# Patient Record
Sex: Male | Born: 1940
Health system: Southern US, Community
[De-identification: ages and names within clinical notes are randomized; demographics above are authoritative.]

## PROBLEM LIST (undated history)

## (undated) DIAGNOSIS — I25119 Atherosclerotic heart disease of native coronary artery with unspecified angina pectoris: Secondary | ICD-10-CM

## (undated) DIAGNOSIS — G4700 Insomnia, unspecified: Secondary | ICD-10-CM

## (undated) DIAGNOSIS — I10 Essential (primary) hypertension: Secondary | ICD-10-CM

## (undated) DIAGNOSIS — Z86010 Personal history of colonic polyps: Secondary | ICD-10-CM

## (undated) DIAGNOSIS — N529 Male erectile dysfunction, unspecified: Secondary | ICD-10-CM

## (undated) DIAGNOSIS — I2510 Atherosclerotic heart disease of native coronary artery without angina pectoris: Secondary | ICD-10-CM

## (undated) DIAGNOSIS — M1610 Unilateral primary osteoarthritis, unspecified hip: Secondary | ICD-10-CM

## (undated) DIAGNOSIS — R911 Solitary pulmonary nodule: Secondary | ICD-10-CM

## (undated) DIAGNOSIS — I340 Nonrheumatic mitral (valve) insufficiency: Secondary | ICD-10-CM

## (undated) DIAGNOSIS — E7800 Pure hypercholesterolemia, unspecified: Secondary | ICD-10-CM

## (undated) DIAGNOSIS — R55 Syncope and collapse: Secondary | ICD-10-CM

## (undated) DIAGNOSIS — D126 Benign neoplasm of colon, unspecified: Secondary | ICD-10-CM

## (undated) DIAGNOSIS — R7301 Impaired fasting glucose: Secondary | ICD-10-CM

## (undated) DIAGNOSIS — C44310 Basal cell carcinoma of skin of unspecified parts of face: Secondary | ICD-10-CM

## (undated) DIAGNOSIS — I700 Atherosclerosis of aorta: Secondary | ICD-10-CM

## (undated) DIAGNOSIS — M11269 Other chondrocalcinosis, unspecified knee: Secondary | ICD-10-CM

## (undated) HISTORY — DX: Syncope and collapse: R55

## (undated) HISTORY — PX: POLYPECTOMY: SHX149

## (undated) HISTORY — DX: Essential (primary) hypertension: I10

## (undated) HISTORY — DX: Pure hypercholesterolemia, unspecified: E78.00

## (undated) HISTORY — DX: Nonrheumatic mitral (valve) insufficiency: I34.0

## (undated) HISTORY — DX: Male erectile dysfunction, unspecified: N52.9

## (undated) HISTORY — DX: Benign neoplasm of colon, unspecified: D12.6

## (undated) HISTORY — PX: TONSILLECTOMY: SHX5217

## (undated) HISTORY — PX: COLONOSCOPY: SHX174

## (undated) HISTORY — DX: Personal history of colonic polyps: Z86.010

## (undated) HISTORY — DX: Insomnia, unspecified: G47.00

## (undated) HISTORY — DX: Atherosclerotic heart disease of native coronary artery with unspecified angina pectoris: I25.119

## (undated) HISTORY — DX: Basal cell carcinoma of skin of unspecified parts of face: C44.310

## (undated) HISTORY — DX: Atherosclerosis of aorta: I70.0

## (undated) HISTORY — DX: Atherosclerotic heart disease of native coronary artery without angina pectoris: I25.10

## (undated) HISTORY — DX: Impaired fasting glucose: R73.01

## (undated) HISTORY — DX: Other chondrocalcinosis, unspecified knee: M11.269

## (undated) HISTORY — DX: Solitary pulmonary nodule: R91.1

## (undated) HISTORY — DX: Unilateral primary osteoarthritis, unspecified hip: M16.10

## (undated) HISTORY — PX: VASECTOMY: SHX75

---

## 2003-07-30 DIAGNOSIS — M11269 Other chondrocalcinosis, unspecified knee: Secondary | ICD-10-CM

## 2003-07-30 HISTORY — DX: Other chondrocalcinosis, unspecified knee: M11.269

## 2003-08-09 ENCOUNTER — Encounter: Admission: RE | Admit: 2003-08-09 | Discharge: 2003-08-09 | Payer: Self-pay | Admitting: Internal Medicine

## 2003-08-09 ENCOUNTER — Encounter: Payer: Self-pay | Admitting: Internal Medicine

## 2005-03-16 ENCOUNTER — Ambulatory Visit: Payer: Self-pay | Admitting: Internal Medicine

## 2005-03-29 DIAGNOSIS — D126 Benign neoplasm of colon, unspecified: Secondary | ICD-10-CM

## 2005-03-29 HISTORY — DX: Benign neoplasm of colon, unspecified: D12.6

## 2005-03-30 ENCOUNTER — Ambulatory Visit: Payer: Self-pay | Admitting: Internal Medicine

## 2005-03-30 ENCOUNTER — Encounter (INDEPENDENT_AMBULATORY_CARE_PROVIDER_SITE_OTHER): Payer: Self-pay | Admitting: *Deleted

## 2006-03-29 DIAGNOSIS — C44310 Basal cell carcinoma of skin of unspecified parts of face: Secondary | ICD-10-CM

## 2006-03-29 HISTORY — DX: Basal cell carcinoma of skin of unspecified parts of face: C44.310

## 2006-03-29 HISTORY — PX: MOHS SURGERY: SHX181

## 2010-04-06 ENCOUNTER — Encounter (INDEPENDENT_AMBULATORY_CARE_PROVIDER_SITE_OTHER): Payer: Self-pay | Admitting: *Deleted

## 2010-05-23 ENCOUNTER — Encounter (INDEPENDENT_AMBULATORY_CARE_PROVIDER_SITE_OTHER): Payer: Self-pay | Admitting: *Deleted

## 2010-05-25 ENCOUNTER — Ambulatory Visit: Payer: Self-pay | Admitting: Internal Medicine

## 2010-06-05 ENCOUNTER — Telehealth: Payer: Self-pay | Admitting: Internal Medicine

## 2010-06-08 ENCOUNTER — Ambulatory Visit: Payer: Self-pay | Admitting: Internal Medicine

## 2010-06-12 ENCOUNTER — Encounter: Payer: Self-pay | Admitting: Internal Medicine

## 2010-11-28 NOTE — Progress Notes (Signed)
  Phone Note Call from Patient Call back at Home Phone [phone]   Reason for Call: Talk to Nurse Summary of Call: Patient called straight into the previsit office with a question.  I was busy rescheduling a patient, and told him that I would take his name and number and call him back.  I called him back at approximately 9:20am and the phone was busy x 2. Will attempt to contact later.  Spoke with patient on phone, and he asked if he could eat some fruit 5 days before the procedure.  The list does only specify raw veggies and salads.  I told the patient that he could eat fruit at that time. Initial call taken by: Clide Cliff RN,  June 05, 2010 9:25 AM

## 2010-11-28 NOTE — Letter (Signed)
Summary: [facility] Instructions  [facility]  [address] [place], [place] [address]   Phone: [phone]  Fax: [phone]       Eric Simmons    [birth year]    MRN: [id number]        Procedure Day Dorna Bloom:  Lenor Coffin  06/08/10     Arrival Time:  7:30am     Procedure Time:  8:30am     Location of Procedure:                    _ X_  [facility] (4th Floor)                        PREPARATION FOR COLONOSCOPY WITH MOVIPREP   Starting 5 days prior to your procedure  SATURDAY 06/03/10  do not eat nuts, seeds, popcorn, corn, beans, peas,  salads, or any raw vegetables.  Do not take any fiber supplements (e.g. Metamucil, Citrucel, and Benefiber).  THE DAY BEFORE YOUR PROCEDURE         DATE: [facility]  06/07/10  1.  Drink clear liquids the entire day-NO SOLID FOOD  2.  Do not drink anything colored red or purple.  Avoid juices with pulp.  No orange juice.  3.  Drink at least 64 oz. (8 glasses) of fluid/clear liquids during the day to prevent dehydration and help the prep work efficiently.  CLEAR LIQUIDS INCLUDE: Water Jello Ice Popsicles Tea (sugar ok, no milk/cream) Powdered fruit flavored drinks Coffee (sugar ok, no milk/cream) Gatorade Juice: apple, Mincey grape, Spivak cranberry  Lemonade Clear bullion, consomm, broth Carbonated beverages (any kind) Strained chicken noodle soup Hard Candy                             4.  In the morning, mix first dose of MoviPrep solution:    Empty 1 Pouch A and 1 Pouch B into the disposable container    Add lukewarm drinking water to the top line of the container. Mix to dissolve    Refrigerate (mixed solution should be used within 24 hrs)  5.  Begin drinking the prep at 5:00 p.m. The MoviPrep container is divided by 4 marks.   Every 15 minutes drink the solution down to the next mark (approximately 8 oz) until the full liter is complete.   6.  Follow completed prep with 16 oz of clear liquid of your  choice (Nothing red or purple).  Continue to drink clear liquids until bedtime.  7.  Before going to bed, mix second dose of MoviPrep solution:    Empty 1 Pouch A and 1 Pouch B into the disposable container    Add lukewarm drinking water to the top line of the container. Mix to dissolve    Refrigerate  THE DAY OF YOUR PROCEDURE      DATE: THURSDAY  06/08/10  Beginning at  3:30 a.m. (5 hours before procedure):         1. Every 15 minutes, drink the solution down to the next mark (approx 8 oz) until the full liter is complete.  2. Follow completed prep with 16 oz. of clear liquid of your choice.    3. You may drink clear liquids until 6:30am (2 HOURS BEFORE PROCEDURE).   MEDICATION INSTRUCTIONS  Unless otherwise instructed, you should take regular prescription medications with a small sip of water   as early as  possible the morning of your procedure.        OTHER INSTRUCTIONS  You will need a responsible adult at least 71 years of age to accompany you and drive you home.   This person must remain in the waiting room during your procedure.  Wear loose fitting clothing that is easily removed.  Leave jewelry and other valuables at home.  However, you may wish to bring a book to read or  an iPod/MP3 player to listen to music as you wait for your procedure to start.  Remove all body piercing jewelry and leave at home.  Total time from sign-in until discharge is approximately 2-3 hours.  You should go home directly after your procedure and rest.  You can resume normal activities the  day after your procedure.  The day of your procedure you should not:   Drive   Make legal decisions   Operate machinery   Drink alcohol   Return to work  You will receive specific instructions about eating, activities and medications before you leave.    The above instructions have been reviewed and explained to me by  Wyona Almas RN  May 25, 2010 8:17 AM     I fully  understand and can verbalize these instructions _____________________________ Date _________

## 2010-11-28 NOTE — Procedures (Signed)
Summary: Colonoscopy  Patient: Eric Simmons Note: All result statuses are Final unless otherwise noted.  Tests: (1) Colonoscopy (COL)   COL Colonoscopy           DONE     [facility]     [address]     [place], [place]  [address]           COLONOSCOPY PROCEDURE REPORT           PATIENT:  Eric Simmons  MR#:  [id number]     BIRTHDATE:  10-10-1941, 69 yrs. old  GENDER:  male     ENDOSCOPIST:  Iva Boop, MD, [facility]           PROCEDURE DATE:  06/08/2010     PROCEDURE:  Colonoscopy with biopsy     ASA CLASS:  Class II     INDICATIONS:  surveillance and high-risk screening, history of     pre-cancerous (adenomatous) colon polyps, family history of colon     cancer Two adenomas, max 7mm 2006     Parent had colon cancer in 70's     MEDICATIONS:   Fentanyl 50 mcg IV, Versed 5 mg IV           DESCRIPTION OF PROCEDURE:   After the risks benefits and     alternatives of the procedure were thoroughly explained, informed     consent was obtained.  Digital rectal exam was performed and     revealed no abnormalities and normal prostate.   The LB CF-H180AL     [id number] endoscope was introduced through the anus and advanced to     the cecum, which was identified by both the appendix and ileocecal     valve, without limitations.  The quality of the prep was     excellent, using MoviPrep.  The instrument was then slowly     withdrawn as the colon was fully examined.     Insertion: 3:42 minutes Withdrawal: 9:12 minutes     <<PROCEDUREIMAGES>>           FINDINGS:  A diminutive polyp was found at the splenic flexure.     The polyp was removed using cold biopsy forceps.  This was     otherwise a normal examination of the colon.   Retroflexed views     in the rectum revealed no abnormalities.    The scope was then     withdrawn from the patient and the procedure completed.           COMPLICATIONS:  None     ENDOSCOPIC IMPRESSION:     1) Diminutive polyp at the splenic flexure -  removed     2) Otherwise normal examination, excellent prep     3) Personal history of adnomas 2006     4) Elderly parent had colon cancer           REPEAT EXAM:  In for Colonoscopy, pending biopsy results.           Iva Boop, MD, [facility]           CC:  Lajean Saver, MD     The Patient           n.     eSIGNED:   Iva Boop at 06/08/2010 10:08 AM           Casandra Doffing, [id number]  Note: An exclamation mark (!) indicates a result that was not dispersed into the flowsheet.  Document Creation Date: 06/08/2010 10:09 AM _______________________________________________________________________  (1) Order result status: Final Collection or observation date-time: 06/08/2010 09:51 Requested date-time:  Receipt date-time:  Reported date-time:  Referring Physician:   Ordering Physician: Stan Head [phone]) Specimen Source:  Source: Launa Grill Order Number: [id number] Lab site:   Appended Document: Colonoscopy     Procedures Next Due Date:    Colonoscopy: 05/2015

## 2010-11-28 NOTE — Letter (Signed)
Summary: Patient Notice-colonic leiomyoma  [facility]  [address] [place], [place] [address]   Phone: [phone]  Fax: [phone]        June 12, 2010 MRN: [id number]    [facility] [address] [place], [place]  [address]    Dear Mr. MABILE,  I am pleased to inform you that the colon polyp removed during your recent colonoscopy was found to be a leiomyoma, or benign muscle tumor. These types of polyps are NOT pre-cancerous.  Since you have had pre-cancerous polyps before and you have a family history of colon cancer, you should consider having a repeat colonoscopy in about 5 years if it makes sense in the context of your health status at that time.  Should you develop new or worsening symptoms of abdominal pain, bowel habit changes or bleeding from the rectum or bowels, please schedule an evaluation with either your primary care physician or with me.  Please call us if you are having persistent problems or have questions about your condition that have not been fully answered at this time.  As always, it was a pleasure to see you and I enjoyed meeting your brother and discussing the [place] War history. Please say hi to your wife, too.  Sincerely,  Ashley Murrain MD, [facility] This letter has been electronically signed by your physician.  Appended Document: Patient Notice-colonic leiomyoma letter mailed

## 2010-11-28 NOTE — Miscellaneous (Signed)
Summary: [facility] Previsit/prep  Clinical Lists Changes  Medications: Added new medication of MOVIPREP 100 GM  SOLR (PEG-KCL-NACL-NASULF-NA ASC-C) As per prep instructions. - Signed Rx of MOVIPREP 100 GM  SOLR (PEG-KCL-NACL-NASULF-NA ASC-C) As per prep instructions.;  #1 x 0;  Signed;  Entered by: Wyona Almas RN;  Authorized by: Iva Boop MD, [facility];  Method used: Electronically to [facility] [address] [place], [place], [place]  [address], Ph: [phone], Fax: [phone] Allergies: Added new allergy or adverse reaction of CODEINE Observations: Added new observation of NKA: F (05/25/2010 7:52)    Prescriptions: MOVIPREP 100 GM  SOLR (PEG-KCL-NACL-NASULF-NA ASC-C) As per prep instructions.  #1 x 0   Entered by:   Wyona Almas RN   Authorized by:   Iva Boop MD, [facility]   Signed by:   Wyona Almas RN on 05/25/2010   Method used:   Electronically to        [facility]retail[facility]       [address]       [place], [place]  [address]       Ph: [phone]       Fax: [phone]   RxID:   [id number]

## 2010-11-28 NOTE — Letter (Signed)
Summary: Colonoscopy Letter  [facility]  [address] [place], [place] [address]   Phone: [phone]  Fax: [phone]      April 06, 2010 MRN: [id number]   [facility] [address] [place], [place]  [address]   Dear Eric Simmons,   According to your medical record, it is time for you to schedule a Colonoscopy. The American Cancer Society recommends this procedure as a method to detect early colon cancer. Patients with a family history of colon cancer, or a personal history of colon polyps or inflammatory bowel disease are at increased risk.  This letter has beeen generated based on the recommendations made at the time of your procedure. If you feel that in your particular situation this may no longer apply, please contact our office.  Please call our office at [phone] to schedule this appointment or to update your records at your earliest convenience.  Thank you for cooperating with [place] to provide you with the very best care possible.   Sincerely,   Iva Boop, M.D.  [facility] [phone]

## 2010-12-21 ENCOUNTER — Other Ambulatory Visit: Payer: Self-pay | Admitting: Dermatology

## 2011-03-02 ENCOUNTER — Ambulatory Visit (INDEPENDENT_AMBULATORY_CARE_PROVIDER_SITE_OTHER): Payer: Medicare Other | Admitting: Family Medicine

## 2011-03-02 DIAGNOSIS — S30860A Insect bite (nonvenomous) of lower back and pelvis, initial encounter: Secondary | ICD-10-CM

## 2011-03-02 DIAGNOSIS — L539 Erythematous condition, unspecified: Secondary | ICD-10-CM

## 2011-05-03 ENCOUNTER — Encounter: Payer: Self-pay | Admitting: Family Medicine

## 2011-05-03 ENCOUNTER — Ambulatory Visit (INDEPENDENT_AMBULATORY_CARE_PROVIDER_SITE_OTHER): Payer: Medicare Other | Admitting: Family Medicine

## 2011-05-03 VITALS — BP 140/88 | HR 60 | Ht 73.0 in | Wt 170.0 lb

## 2011-05-03 DIAGNOSIS — R202 Paresthesia of skin: Secondary | ICD-10-CM

## 2011-05-03 DIAGNOSIS — M79601 Pain in right arm: Secondary | ICD-10-CM

## 2011-05-03 DIAGNOSIS — R209 Unspecified disturbances of skin sensation: Secondary | ICD-10-CM

## 2011-05-03 DIAGNOSIS — M79609 Pain in unspecified limb: Secondary | ICD-10-CM

## 2011-05-03 MED ORDER — NAPROXEN 500 MG PO TABS: 500.0000 mg | ORAL_TABLET | Freq: Two times a day (BID) | ORAL | Status: DC

## 2011-05-03 NOTE — Progress Notes (Signed)
Subjective:    Patient ID: Eric Simmons, male    DOB: 01-30-1941, 70 y.o.   MRN: [id number]  HPI Patient presents for evaluation of R arm pain and tingling.  Pain starts at the shoulder, and radiates down to the elbow.  Tingling goes all the way down to the hand/wrist (not into the fingers).  During exam was able to note some tingling in the R thumb (which didn't change with Phalen).  Pain is bothersome in the evenings, hurts to sleep on his right side, feels better when he changes to his left side.  Pain/tingling has been going on for about a week.  Tingling seems improved if he uses his arm more while on his exercise bike (increased from 10 to 15 minutes).  Denies other changes in activity.  Denies neck pain, upper back pain.  Denies pain with movements or any particular activity.  Started taking Naprosyn sporadically for severe pain, and this seems to help some.  Just taking 1/day, if any, of the naprosyn (has taken 4-5 in the last 7-10 days).  Denies weakness  Past Medical History  Diagnosis Date  . Hypertension   . Pure hypercholesterolemia   . Impaired fasting glucose   . Pseudogout of knee 10/04    left  . Adenomatous colon polyp 6/06    tubular adenoma, Dr. Leone Payor  . Hemorrhoids     internal and external  . BCC (basal cell carcinoma), face 6/07    left face  . Insomnia     Past Surgical History  Procedure Date  . Colonoscopy 6/06, 05/2010    Dr. Leone Payor  . Tonsillectomy   . Mohs surgery 6/07    L face, Dr. Park Liter    History   Social History  . Marital Status: Married    Spouse Name: N/A    Number of Children: N/A  . Years of Education: N/A   Occupational History  . Not on file.   Social History Main Topics  . Smoking status: Never Smoker   . Smokeless tobacco: Not on file  . Alcohol Use: Yes     1 light beer every afternoon  . Drug Use: No  . Sexually Active: Not on file   Other Topics Concern  . Not on file   Social History Narrative  . No  narrative on file    Family History  Problem Relation Age of Onset  . Cancer Mother 71    colon cancer  . Diabetes Mother   . Heart disease Father     MI  . Hypertension Father   . Hyperlipidemia Brother   . Heart disease Brother     septal defect  . Tremor Brother   . Hyperlipidemia Brother   . Tremor Brother   . Hyperlipidemia Brother     Current outpatient prescriptions:aspirin 81 MG tablet, Take 81 mg by mouth daily.  , Disp: , Rfl: ;  magnesium gluconate (MAGONATE) 500 MG tablet, Take 500 mg by mouth daily.  , Disp: , Rfl: ;  Multiple Vitamin (ANTIOXIDANT FORMULA PO), Take 1 tablet by mouth daily.  , Disp: , Rfl: ;  simvastatin (ZOCOR) 40 MG tablet, Take 40 mg by mouth at bedtime.  , Disp: , Rfl:  zolpidem (AMBIEN) 5 MG tablet, Take 2.5 mg by mouth at bedtime as needed.  , Disp: , Rfl: ;  DISCONTD: naproxen (NAPROSYN) 500 MG tablet, Take 500 mg by mouth daily. , Disp: , Rfl: ;  DISCONTD: naproxen sodium (ANAPROX)  220 MG tablet, Take 220 mg by mouth 2 (two) times daily with a meal.  , Disp: , Rfl: ;  naproxen (NAPROSYN) 500 MG tablet, Take 1 tablet (500 mg total) by mouth 2 (two) times daily with a meal., Disp: 30 tablet, Rfl: 0  Allergies  Allergen Reactions  . Codeine     REACTION: violent vomiting    Review of Systems Denies fever, URI symptoms, cough, SOB, skin rash, other joint complaints, GI complaints, chest pain, headaches, dizziness or other concerns    Objective:   Physical Exam BP 140/88  Pulse 60  Ht 6\' 1"  (1.854 m)  Wt 170 lb (77.111 kg)  BMI 22.43 kg/m2 Well developed, pleasant male in no distress Neck--no c-spine tenderness, lymphadenopathy or masses.  No muscle spasm Slight decreased ROM R shoulder--with reaching R hand over L shoulder, and decreased internal rotation (R thumb about 2 inches lower than L, behind back).  Strength 5/5.  Negative Tinel and Phalen's testing.  Normal sensation 2+ radial pulses and brisk capillary refill bilaterally         Assessment & Plan:   1. Paresthesias in right hand  naproxen (NAPROSYN) 500 MG tablet  2. Arm pain, right     Differential diagnoses reviewed (including cervical radiculopathy, peripheral nerve impingement/neuropathy, carpal tunnel).  Trial of NSAIDs (risks/precautions reviewed with pt).  May need further workup if pain and paresthesias persist (may need x-ray vs MRI vs EMG/NCV).  Follow up in 2 weeks if not resolved.  BP elevated today.  Patient has BP monitor at home--instructed pt to periodically check BP's at home.  BP higher today than at previous visits

## 2011-05-03 NOTE — Patient Instructions (Signed)
Periodically check your blood pressure at home (it was a little high today at 140/88). Take the medication twice daily with food--take regularly until symptoms completely resolve, or follow-up in 2 weeks if ongoing symptoms to discuss the next step

## 2011-05-15 ENCOUNTER — Encounter: Payer: Self-pay | Admitting: Family Medicine

## 2011-05-17 ENCOUNTER — Ambulatory Visit (INDEPENDENT_AMBULATORY_CARE_PROVIDER_SITE_OTHER): Payer: Medicare Other | Admitting: Family Medicine

## 2011-05-17 ENCOUNTER — Encounter: Payer: Self-pay | Admitting: Family Medicine

## 2011-05-17 VITALS — BP 122/72 | HR 68 | Ht 73.0 in | Wt 170.0 lb

## 2011-05-17 DIAGNOSIS — M79601 Pain in right arm: Secondary | ICD-10-CM

## 2011-05-17 DIAGNOSIS — M79609 Pain in unspecified limb: Secondary | ICD-10-CM

## 2011-05-17 MED ORDER — METHYLPREDNISOLONE (PAK) 4 MG PO TABS: ORAL_TABLET | ORAL | Status: AC

## 2011-05-17 MED ORDER — HYDROCODONE-ACETAMINOPHEN 7.5-500 MG PO TABS: 1.0000 | ORAL_TABLET | Freq: Four times a day (QID) | ORAL | Status: DC | PRN

## 2011-05-17 NOTE — Progress Notes (Signed)
Patient presents for evaluation of ongoing pain and tingling in his R shoulder, which radiates down the arm.  Having pain mainly at night, between 2-4am.  Gets some relief by changing position, to sleep on his left side.  He has taken the Naprosyn as directed and has noticed no improvement.  Symptoms are intermittent.  Sometimes feels as though he has needles sticking in his R arm (forearm), or sometimes like "rain drops".  Denies any weakness, no neck pain.  Past Medical History  Diagnosis Date  . Hypertension   . Pure hypercholesterolemia   . Impaired fasting glucose   . Pseudogout of knee 10/04    left  . Adenomatous colon polyp 6/06    tubular adenoma, Dr. Leone Payor  . Hemorrhoids     internal and external  . BCC (basal cell carcinoma), face 6/07    left face  . Insomnia     Past Surgical History  Procedure Date  . Colonoscopy 6/06, 05/2010    Dr. Leone Payor  . Tonsillectomy   . Mohs surgery 6/07    L face, Dr. Park Liter    History   Social History  . Marital Status: Married    Spouse Name: N/A    Number of Children: N/A  . Years of Education: N/A   Occupational History  . Not on file.   Social History Main Topics  . Smoking status: Never Smoker   . Smokeless tobacco: Not on file  . Alcohol Use: Yes     1 light beer every afternoon  . Drug Use: No  . Sexually Active: Not on file   Other Topics Concern  . Not on file   Social History Narrative  . No narrative on file    Family History  Problem Relation Age of Onset  . Cancer Mother 34    colon cancer  . Diabetes Mother   . Heart disease Father     MI  . Hypertension Father   . Hyperlipidemia Brother   . Heart disease Brother     septal defect  . Tremor Brother   . Hyperlipidemia Brother   . Tremor Brother   . Hyperlipidemia Brother    Current Outpatient Prescriptions on File Prior to Visit  Medication Sig Dispense Refill  . aspirin 81 MG tablet Take 81 mg by mouth daily.        . magnesium  gluconate (MAGONATE) 500 MG tablet Take 500 mg by mouth daily.        . Multiple Vitamin (ANTIOXIDANT FORMULA PO) Take 1 tablet by mouth daily.        . naproxen (NAPROSYN) 500 MG tablet Take 1 tablet (500 mg total) by mouth 2 (two) times daily with a meal.  30 tablet  0  . simvastatin (ZOCOR) 40 MG tablet Take 40 mg by mouth at bedtime.        [facility] zolpidem (AMBIEN) 5 MG tablet Take 2.5 mg by mouth at bedtime as needed.          Allergies  Allergen Reactions  . Codeine     REACTION: violent vomiting   ROS:  Denies fevers, weakness, neck pain, skin rash, abdominal pain, or other concerns  PHYSICAL EXAM: BP 122/72  Pulse 68  Ht 6\' 1"  (1.854 m)  Wt 170 lb (77.111 kg)  BMI 22.43 kg/m2 Elderly pleasant male, in no distress C-spine nontender; no muscle spasm Nontender at clavicles, shoulder, AC joints Strength is 5/5 bilaterally, normal sensation Now with FROM, equally  bilaterally (improved from last visit) +phalen on R--caused tingling at R wrist which spread up forearm towards shoulder (no tingling in fingers).  Negative Tinel sign  ASSESSMENT/PLAN: 1. Arm pain, right  DG Cervical Spine Complete, DG Shoulder Right, methylPREDNIsolone (MEDROL DOSPACK) 4 MG tablet, HYDROcodone-acetaminophen (LORTAB) 7.5-500 MG per tablet    X-ray neck and R shoulder Treat for carpal tunnel syndrome with R wrist brace Course of steroids (since NSAID's ineffective) Patient to call her in 10 days or so (week of 7/30) and if symptoms aren't improved, might need referral for EMG/NCV

## 2011-05-17 NOTE — Patient Instructions (Signed)
Please call the office the week of 7/30 with an update on your progress (after the course of steroids).  We will need to decide if we need to proceed with nerve conduction studies and EMG

## 2011-05-18 ENCOUNTER — Ambulatory Visit
Admission: RE | Admit: 2011-05-18 | Discharge: 2011-05-18 | Disposition: A | Discharge status: Home / Self Care | Payer: Medicare Other | Source: Ambulatory Visit | Attending: Family Medicine | Admitting: Family Medicine

## 2011-05-18 DIAGNOSIS — M79601 Pain in right arm: Secondary | ICD-10-CM

## 2011-05-21 ENCOUNTER — Telehealth: Payer: Self-pay

## 2011-05-21 NOTE — Telephone Encounter (Signed)
Pt is aware and referral has been sent [facility]

## 2011-05-28 ENCOUNTER — Telehealth: Payer: Self-pay | Admitting: Family Medicine

## 2011-05-28 DIAGNOSIS — M79601 Pain in right arm: Secondary | ICD-10-CM

## 2011-05-28 MED ORDER — HYDROCODONE-ACETAMINOPHEN 7.5-500 MG PO TABS: 1.0000 | ORAL_TABLET | Freq: Four times a day (QID) | ORAL | Status: AC | PRN

## 2011-05-28 NOTE — Telephone Encounter (Signed)
Patient is now scheduled with [facility] for upper extremity EMG/NCV on 05/30/11 @ 7:30am, pt aware. Also called in Lortab(generic)7.5/500mg  #15 q 6hr for pain 0rf's to [facility].

## 2011-05-28 NOTE — Telephone Encounter (Signed)
Called [facility] to check on status of NCV referral which was faxed on 7/23. They stated that it takes up to 14 days for them to schedule any new patient. I called patient and explained this to him, he was okay with this but did ask if he would be able to have another #15 of his pain medication as he may need these until he can get scheduled for the nerve conduction study, he did say he was using them sparingly.

## 2011-05-28 NOTE — Telephone Encounter (Signed)
We likely can get him an EMG/NCV at another neurologist sooner (check with the [facility] to see if they do EMG/NCV). I'm not asking for a consult, just the study (with their interpretation). Okay for #15 of pain meds if needed.

## 2011-05-28 NOTE — Telephone Encounter (Signed)
Please check on status of referral--see xray results for my recommendations

## 2011-05-31 ENCOUNTER — Telehealth: Payer: Self-pay | Admitting: Family Medicine

## 2011-05-31 DIAGNOSIS — G5600 Carpal tunnel syndrome, unspecified upper limb: Secondary | ICD-10-CM | POA: Insufficient documentation

## 2011-05-31 DIAGNOSIS — R202 Paresthesia of skin: Secondary | ICD-10-CM

## 2011-05-31 DIAGNOSIS — M542 Cervicalgia: Secondary | ICD-10-CM | POA: Insufficient documentation

## 2011-05-31 MED ORDER — NAPROXEN 500 MG PO TABS: 500.0000 mg | ORAL_TABLET | Freq: Two times a day (BID) | ORAL | Status: DC

## 2011-05-31 NOTE — Telephone Encounter (Signed)
Patient was advised of EMG/NCV results, showing R>L carpal tunnel syndrome, no cervical radiculopathy. Started having neck pain since his last visit, keeping him awake at night.  Has been out of the Naprosyn, and the painkillers aren't helping much.  Not having as much arm pain, just neck to shoulder.  Requesting PT for neck  Refilled naprosyn to [facility] Hold off on referral to Dr. Teressa Senter (initially discussed poss cortisone shot) due to neck causing more pain than wrist at the present time.  Refer to [facility] for PT for neck pain

## 2011-06-20 ENCOUNTER — Ambulatory Visit: Payer: Medicare Other | Attending: Family Medicine | Admitting: Physical Therapy

## 2011-06-20 ENCOUNTER — Ambulatory Visit: Payer: Medicare Other | Admitting: Physical Therapy

## 2011-06-20 DIAGNOSIS — M25519 Pain in unspecified shoulder: Secondary | ICD-10-CM | POA: Insufficient documentation

## 2011-06-20 DIAGNOSIS — M542 Cervicalgia: Secondary | ICD-10-CM | POA: Insufficient documentation

## 2011-06-20 DIAGNOSIS — R293 Abnormal posture: Secondary | ICD-10-CM | POA: Insufficient documentation

## 2011-06-20 DIAGNOSIS — IMO0001 Reserved for inherently not codable concepts without codable children: Secondary | ICD-10-CM | POA: Insufficient documentation

## 2011-06-27 ENCOUNTER — Ambulatory Visit: Payer: Medicare Other | Admitting: Physical Therapy

## 2011-06-28 ENCOUNTER — Other Ambulatory Visit: Payer: Self-pay | Admitting: Family Medicine

## 2011-06-28 DIAGNOSIS — G4700 Insomnia, unspecified: Secondary | ICD-10-CM

## 2011-06-28 MED ORDER — ZOLPIDEM TARTRATE 5 MG PO TABS: ORAL_TABLET | ORAL | Status: DC

## 2011-06-28 NOTE — Telephone Encounter (Signed)
Please phone in order as above to [facility], and sign.  Thanks

## 2011-07-04 ENCOUNTER — Ambulatory Visit: Payer: Medicare Other | Attending: Family Medicine | Admitting: Rehabilitative and Restorative Service Providers"

## 2011-07-04 DIAGNOSIS — R293 Abnormal posture: Secondary | ICD-10-CM | POA: Insufficient documentation

## 2011-07-04 DIAGNOSIS — M25519 Pain in unspecified shoulder: Secondary | ICD-10-CM | POA: Insufficient documentation

## 2011-07-04 DIAGNOSIS — IMO0001 Reserved for inherently not codable concepts without codable children: Secondary | ICD-10-CM | POA: Insufficient documentation

## 2011-07-04 DIAGNOSIS — M542 Cervicalgia: Secondary | ICD-10-CM | POA: Insufficient documentation

## 2011-07-11 ENCOUNTER — Ambulatory Visit: Payer: Medicare Other | Admitting: Physical Therapy

## 2011-07-18 ENCOUNTER — Ambulatory Visit (INDEPENDENT_AMBULATORY_CARE_PROVIDER_SITE_OTHER): Payer: Medicare Other | Admitting: Family Medicine

## 2011-07-18 ENCOUNTER — Ambulatory Visit: Payer: Medicare Other | Admitting: Rehabilitative and Restorative Service Providers"

## 2011-07-18 ENCOUNTER — Encounter: Payer: Self-pay | Admitting: Family Medicine

## 2011-07-18 VITALS — BP 128/70 | HR 60 | Ht 73.0 in | Wt 170.0 lb

## 2011-07-18 DIAGNOSIS — G5600 Carpal tunnel syndrome, unspecified upper limb: Secondary | ICD-10-CM

## 2011-07-18 DIAGNOSIS — Z23 Encounter for immunization: Secondary | ICD-10-CM

## 2011-07-18 DIAGNOSIS — M542 Cervicalgia: Secondary | ICD-10-CM

## 2011-07-18 DIAGNOSIS — M5412 Radiculopathy, cervical region: Secondary | ICD-10-CM

## 2011-07-18 NOTE — Progress Notes (Signed)
Patient presents for f/u on right arm and neck pain.  Has been getting PT for 5 weeks (once weekly). Feels better but not 100%. Still waking from pain 2-4 am nightly.  Pain starts in the neck, radiates into R arm, into R 1st and 2nd finger.  Has shooting pain that occurs with an exercise that externally rotates the shoulder. Sometimes also gets the shooting pain and tingling with some other exercises, when he turns his head to the left, and then returns to normal position.  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  No evidence of cervical radiculopathy--study was done 05/2011.  Past Medical History  Diagnosis Date  . Hypertension   . Pure hypercholesterolemia   . Impaired fasting glucose   . Pseudogout of knee 10/04    left  . Adenomatous colon polyp 6/06    tubular adenoma, Dr. Leone Payor  . Hemorrhoids     internal and external  . BCC (basal cell carcinoma), face 6/07    left face  . Insomnia     Past Surgical History  Procedure Date  . Colonoscopy 6/06, 05/2010    Dr. Leone Payor  . Tonsillectomy   . Mohs surgery 6/07    L face, Dr. Park Liter    History   Social History  . Marital Status: Married    Spouse Name: N/A    Number of Children: N/A  . Years of Education: N/A   Occupational History  . Not on file.   Social History Main Topics  . Smoking status: Never Smoker   . Smokeless tobacco: Not on file  . Alcohol Use: Yes     1 light beer every afternoon  . Drug Use: No  . Sexually Active: Not on file   Other Topics Concern  . Not on file   Social History Narrative  . No narrative on file    Family History  Problem Relation Age of Onset  . Cancer Mother 19    colon cancer  . Diabetes Mother   . Heart disease Father     MI  . Hypertension Father   . Hyperlipidemia Brother   . Heart disease Brother     septal defect  . Tremor Brother   . Hyperlipidemia  Brother   . Tremor Brother   . Hyperlipidemia Brother     Current outpatient prescriptions:aspirin 81 MG tablet, Take 81 mg by mouth daily.  , Disp: , Rfl: ;  magnesium gluconate (MAGONATE) 500 MG tablet, Take 500 mg by mouth daily.  , Disp: , Rfl: ;  Multiple Vitamin (ANTIOXIDANT FORMULA PO), Take 1 tablet by mouth daily.  , Disp: , Rfl: ;  naproxen sodium (ANAPROX) 220 MG tablet, Take 220 mg by mouth as needed.  , Disp: , Rfl:  simvastatin (ZOCOR) 40 MG tablet, Take 40 mg by mouth at bedtime.  , Disp: , Rfl: ;  zolpidem (AMBIEN) 5 MG tablet, Take 1/2 to 1 tablet by mouth at bedtime as needed for insomnia, Disp: 30 tablet, Rfl: 1;  naproxen (NAPROSYN) 500 MG tablet, Take 1 tablet (500 mg total) by mouth 2 (two) times daily with a meal., Disp: 30 tablet, Rfl: 0  Allergies  Allergen Reactions  . Codeine     REACTION: violent vomiting   ROS:  Denies fevers, URI symptoms, cough, SOB, chest pain, GI complaints.  +pain, tingling.  Denies weakness.  No rashes, or other joint pains.  Wife's MS is flaring, but is reportedly doing well, denies depression, insomnia  PHYSICAL EXAM: BP 128/70  Pulse 60  Ht 6\' 1"  (1.854 m)  Wt 170 lb (77.111 kg)  BMI 22.43 kg/m2 Well developed, pleasant elderly gentleman in no distress Strength 5/5 and symmetric in upper extremities.  DTR's 2+ and symmetric.  Normal sensation. +phalens--some discomfort in R thumb  ASSESSMENT/PLAN: 1. Neck pain    2. Cervical radiculopathy  MR Cervical Spine Wo Contrast  3. Carpal tunnel syndrome    4. Need for prophylactic vaccination and inoculation against influenza  Flu vaccine greater than or equal to 3yo preservative free IM   Neck pain and RUE pain.  Some component of carpal tunnel syndrome; no evidence of cervical radiculopathy on EMG, but history suggests it.  He has been getting PT, and had a course of NSAID's. Pain overall is less, but given that it still causes him pain, wakes him up at night, he'd like to pursue other  treatment options.  I recommend proceeding with MRI.  Likely would want further conservative treatment (ie epidural steroids rather than surgery), but will have him consult with neurosurgeons to discuss potential treatment options after MRI results in.    Refer to Dr. Yetta Barre after MRI results  Zostavax Rx written and given to patient.  Counseled regarding risks/benefits.  Wife is not currently on any immunosuppresant medications

## 2011-07-25 ENCOUNTER — Other Ambulatory Visit: Payer: Medicare Other

## 2011-07-26 ENCOUNTER — Ambulatory Visit: Payer: Medicare Other | Admitting: Physical Therapy

## 2011-07-26 ENCOUNTER — Ambulatory Visit
Admission: RE | Admit: 2011-07-26 | Discharge: 2011-07-26 | Disposition: A | Discharge status: Home / Self Care | Payer: Medicare Other | Source: Ambulatory Visit | Attending: Family Medicine | Admitting: Family Medicine

## 2011-07-26 DIAGNOSIS — M5412 Radiculopathy, cervical region: Secondary | ICD-10-CM

## 2011-07-30 ENCOUNTER — Telehealth: Payer: Self-pay | Admitting: *Deleted

## 2011-07-30 NOTE — Telephone Encounter (Signed)
Spoke with patient, MRI results given and referral faxed to [facility].

## 2011-10-18 ENCOUNTER — Other Ambulatory Visit: Payer: Self-pay | Admitting: Family Medicine

## 2011-10-18 DIAGNOSIS — G4700 Insomnia, unspecified: Secondary | ICD-10-CM

## 2011-10-18 MED ORDER — ZOLPIDEM TARTRATE 5 MG PO TABS: ORAL_TABLET | ORAL | Status: DC

## 2011-10-18 NOTE — Telephone Encounter (Signed)
Called in to [facility].

## 2011-10-18 NOTE — Telephone Encounter (Signed)
Please phone in. Thanks!

## 2011-12-03 ENCOUNTER — Telehealth: Payer: Self-pay | Admitting: Internal Medicine

## 2011-12-03 DIAGNOSIS — Z125 Encounter for screening for malignant neoplasm of prostate: Secondary | ICD-10-CM

## 2011-12-03 DIAGNOSIS — Z79899 Other long term (current) drug therapy: Secondary | ICD-10-CM

## 2011-12-03 DIAGNOSIS — R5383 Other fatigue: Secondary | ICD-10-CM

## 2011-12-03 DIAGNOSIS — R5381 Other malaise: Secondary | ICD-10-CM

## 2011-12-03 MED ORDER — SIMVASTATIN 40 MG PO TABS: 40.0000 mg | ORAL_TABLET | Freq: Every day | ORAL | Status: DC

## 2011-12-03 NOTE — Telephone Encounter (Signed)
Pt is scheduled for CPE 12/13/11 and would like to come in 12/07/11 for pre-labs. Can you let me know what you need and I can put in future orders please. Thanks.

## 2011-12-03 NOTE — Telephone Encounter (Signed)
Orders entered

## 2011-12-07 ENCOUNTER — Other Ambulatory Visit: Payer: Medicare Other

## 2011-12-07 DIAGNOSIS — Z79899 Other long term (current) drug therapy: Secondary | ICD-10-CM

## 2011-12-07 DIAGNOSIS — Z125 Encounter for screening for malignant neoplasm of prostate: Secondary | ICD-10-CM | POA: Diagnosis not present

## 2011-12-07 DIAGNOSIS — R5381 Other malaise: Secondary | ICD-10-CM

## 2011-12-08 LAB — CBC WITH DIFFERENTIAL/PLATELET
Basophils Relative: 1 % (ref 0–1)
Eosinophils Absolute: 0.2 10*3/uL (ref 0.0–0.7)
HCT: 44.5 % (ref 39.0–52.0)
Hemoglobin: 14.7 g/dL (ref 13.0–17.0)
MCH: 29.8 pg (ref 26.0–34.0)
MCHC: 33 g/dL (ref 30.0–36.0)
Monocytes Absolute: 0.6 10*3/uL (ref 0.1–1.0)
Monocytes Relative: 12 % (ref 3–12)

## 2011-12-08 LAB — COMPREHENSIVE METABOLIC PANEL
ALT: 25 U/L (ref 0–53)
Alkaline Phosphatase: 66 U/L (ref 39–117)
CO2: 25 mEq/L (ref 19–32)
Sodium: 138 mEq/L (ref 135–145)
Total Bilirubin: 0.8 mg/dL (ref 0.3–1.2)
Total Protein: 6.3 g/dL (ref 6.0–8.3)

## 2011-12-08 LAB — LIPID PANEL
HDL: 51 mg/dL (ref >39)
LDL Cholesterol: 84 mg/dL (ref 0–99)
Total CHOL/HDL Ratio: 3 Ratio

## 2011-12-13 ENCOUNTER — Ambulatory Visit (INDEPENDENT_AMBULATORY_CARE_PROVIDER_SITE_OTHER): Payer: Medicare Other | Admitting: Family Medicine

## 2011-12-13 ENCOUNTER — Encounter: Payer: Self-pay | Admitting: Family Medicine

## 2011-12-13 VITALS — BP 120/72 | HR 64 | Ht 73.0 in | Wt 169.0 lb

## 2011-12-13 DIAGNOSIS — Z23 Encounter for immunization: Secondary | ICD-10-CM

## 2011-12-13 DIAGNOSIS — E7800 Pure hypercholesterolemia, unspecified: Secondary | ICD-10-CM | POA: Insufficient documentation

## 2011-12-13 DIAGNOSIS — Z0000 Encounter for general adult medical examination without abnormal findings: Secondary | ICD-10-CM | POA: Diagnosis not present

## 2011-12-13 LAB — POCT URINALYSIS DIPSTICK
Ketones, UA: NEGATIVE
Protein, UA: NEGATIVE
Spec Grav, UA: 1.01
pH, UA: 5

## 2011-12-13 MED ORDER — SIMVASTATIN 40 MG PO TABS: 40.0000 mg | ORAL_TABLET | Freq: Every day | ORAL | Status: DC

## 2011-12-13 NOTE — Patient Instructions (Addendum)
HEALTH MAINTENANCE RECOMMENDATIONS:  It is recommended that you get at least 30 minutes of aerobic exercise at least 5 days/week (for weight loss, you may need as much as 60-90 minutes). This can be any activity that gets your heart rate up. This can be divided in 10-15 minute intervals if needed, but try and build up your endurance at least once a week.  Weight bearing exercise is also recommended twice weekly.  Eat a healthy diet with lots of vegetables, fruits and fiber.  "Colorful" foods have a lot of vitamins (ie green vegetables, tomatoes, red peppers, etc).  Limit sweet tea, regular sodas and alcoholic beverages, all of which has a lot of calories and sugar.  Up to 2 alcoholic drinks daily may be beneficial for men (unless trying to lose weight, watch sugars).  Drink a lot of water.  Sunscreen of at least SPF 30 should be used on all sun-exposed parts of the skin when outside between the hours of 10 am and 4 pm (not just when at beach or pool, but even with exercise, golf, tennis, and yard work!)  Use a sunscreen that says "broad spectrum" so it covers both UVA and UVB rays, and make sure to reapply every 1-2 hours.  Remember to change the batteries in your smoke detectors when changing your clock times in the spring and fall.  Use your seat belt every time you are in a car, and please drive safely and not be distracted with cell phones and texting while driving.   Don't forget about getting your shingles vaccine.  If you cannot find the prescription I gave you in the past, just give [place] a call for another one.

## 2011-12-13 NOTE — Progress Notes (Signed)
Eric Simmons is a 71 y.o. male who presents for a complete physical.  He has the following concerns:  Neck pain resolved with PT.  The traction was extremely helpful, and was significantly improved after just the first visit.  No longer having any neck or shoulder pain, or numbness in R arm.  F/u hyperlipidemia.  Had labs prior to visit.  Compliant with meds and diet, denies side effects.  Health Maintenance: Immunization History  Administered Date(s) Administered  . Influenza Split 07/18/2011  . Tdap 03/02/2011   Last colonoscopy: 05/2010 Last PSA: recent labs, normal Dentist: twice yearly Ophtho: 2 years, due Exercise: exercise bike 4-5x/week for 20 minutes and walks dog frequently  Past Medical History  Diagnosis Date  . Pure hypercholesterolemia   . Impaired fasting glucose   . Pseudogout of knee 10/04    left  . Adenomatous colon polyp 6/06    tubular adenoma, Dr. Leone Payor  . Hemorrhoids     internal and external  . BCC (basal cell carcinoma), face 6/07    left face  . Insomnia     Past Surgical History  Procedure Date  . Colonoscopy 6/06, 05/2010    Dr. Leone Payor  . Tonsillectomy   . Mohs surgery 6/07    L face, Dr. Park Liter  . Vasectomy     History   Social History  . Marital Status: Married    Spouse Name: N/A    Number of Children: N/A  . Years of Education: N/A   Occupational History  . retired    Social History Main Topics  . Smoking status: Former Smoker    Types: Cigars  . Smokeless tobacco: Never Used  . Alcohol Use: Yes     1 light beer every afternoon  . Drug Use: No  . Sexually Active: Not Currently   Other Topics Concern  . Not on file   Social History Narrative   Lives with wife.  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 Children live in [place], [place] and [place]    Family History  Problem Relation Age of Onset  . Cancer Mother 55    colon cancer  . Diabetes Mother   . Heart disease Father     MI  . Hypertension  Father   . Hyperlipidemia Brother   . Heart disease Brother     septal defect  . Tremor Brother   . Hyperlipidemia Brother   . Tremor Brother   . Hyperlipidemia Brother   . Hyperlipidemia Son   . Hyperlipidemia Son     Current outpatient prescriptions:aspirin 81 MG tablet, Take 81 mg by mouth daily.  , Disp: , Rfl: ;  Magnesium 250 MG TABS, Take 1-2 tablets by mouth daily., Disp: , Rfl: ;  Multiple Vitamin (ANTIOXIDANT FORMULA PO), Take 1 tablet by mouth daily.  , Disp: , Rfl: ;  simvastatin (ZOCOR) 40 MG tablet, Take 1 tablet (40 mg total) by mouth at bedtime., Disp: 90 tablet, Rfl: 3 zolpidem (AMBIEN) 5 MG tablet, Take 1/2 to 1 tablet by mouth at bedtime as needed for insomnia, Disp: 30 tablet, Rfl: 1  Allergies  Allergen Reactions  . Codeine     REACTION: violent vomiting   ROS:  The patient denies anorexia, fever, weight changes, headaches,  vision loss, decreased hearing, ear pain, hoarseness, chest pain, palpitations, dizziness, syncope, dyspnea on exertion, cough, swelling, nausea, vomiting, diarrhea, abdominal pain, melena, hematochezia, indigestion/heartburn, hematuria, incontinence, erectile dysfunction (not in a sexual relationship with wife due  to her health issues), nocturia, weakened urine stream, dysuria, genital lesions, joint pains, numbness, tingling, weakness, tremor, suspicious skin lesions, depression, anxiety, abnormal bleeding/bruising, or enlarged lymph nodes  +dizzy spell/vertigo at beach a few months ago, none since.  Occasional tremors, better with taking the Mg. Occasional constipation, relieved by infrequent glycerine suppository. Has had a lot of deaths in the family over the last year, but overall moods have been good. +insomnia.  Uses 2.5mg  of ambien on most days with good results.  PHYSICAL EXAM: BP 120/72  Pulse 64  Ht 6\' 1"  (1.854 m)  Wt 169 lb (76.658 kg)  BMI 22.30 kg/m2  General Appearance:    Alert, cooperative, no distress, appears stated age    Head:    Normocephalic, without obvious abnormality, atraumatic  Eyes:    PERRL, conjunctiva/corneas clear, EOM's intact, fundi    benign  Ears:    Normal TM's and external ear canals  Nose:   Nares normal, mucosa normal, no drainage or sinus   tenderness  Throat:   Lips, mucosa, and tongue normal; teeth and gums normal  Neck:   Supple, no lymphadenopathy;  thyroid:  no   enlargement/tenderness/nodules; no carotid   bruit or JVD  Back:    Spine nontender, no curvature, ROM normal, no CVA     tenderness  Lungs:     Clear to auscultation bilaterally without wheezes, rales or     ronchi; respirations unlabored  Chest Wall:    No tenderness or deformity   Heart:    Regular rate and rhythm, S1 and S2 normal, no murmur, rub   or gallop  Breast Exam:    No chest wall tenderness, masses or gynecomastia  Abdomen:     Soft, non-tender, nondistended, normoactive bowel sounds,    no masses, no hepatosplenomegaly  Genitalia:    Normal male external genitalia without lesions.  Testicles without masses.  No inguinal hernias.  Rectal:    Normal sphincter tone, no masses or tenderness; guaiac negative stool.  Prostate smooth, no nodules, not enlarged.  Extremities:   No clubbing, cyanosis or edema  Pulses:   2+ and symmetric all extremities  Skin:   Skin color, texture, turgor normal, no rashes or lesions  Lymph nodes:   Cervical, supraclavicular, and axillary nodes normal  Neurologic:   CNII-XII intact, normal strength, sensation and gait; reflexes 2+ and symmetric throughout          Psych:   Normal mood, affect, hygiene and grooming.     ASSESSMENT/PLAN:  1. Routine general medical examination at a health care facility  POCT Urinalysis Dipstick, Visual acuity screening  2. Pure hypercholesterolemia  simvastatin (ZOCOR) 40 MG tablet  3. Need for pneumococcal vaccination  Pneumococcal polysaccharide vaccine 23-valent greater than or equal to 2yo subcutaneous/IM   Discussed PSA screening  (risks/benefits), recommended at least 30 minutes of aerobic activity at least 5 days/week; proper sunscreen use reviewed; healthy diet and alcohol recommendations (less than or equal to 2 drinks/day) reviewed; regular seatbelt use; changing batteries in smoke detectors. Self-testicular exams. Immunization recommendations discussed. Zostavax recommended. Previously given Rx to get at pharmacy. Colonoscopy recommendations reviewed--UTD (q5 yrs bc h/o polyps).

## 2011-12-27 ENCOUNTER — Encounter: Payer: Self-pay | Admitting: Family Medicine

## 2011-12-27 ENCOUNTER — Ambulatory Visit (INDEPENDENT_AMBULATORY_CARE_PROVIDER_SITE_OTHER): Payer: Medicare Other | Admitting: Family Medicine

## 2011-12-27 VITALS — BP 120/70 | HR 76 | Temp 98.6°F | Wt 170.0 lb

## 2011-12-27 DIAGNOSIS — J069 Acute upper respiratory infection, unspecified: Secondary | ICD-10-CM | POA: Diagnosis not present

## 2011-12-27 NOTE — Progress Notes (Signed)
  Subjective:    Patient ID: Eric Simmons, male    DOB: May 29, 1941, 71 y.o.   MRN: [id number]  HPI He has a four day history of rhinorrhea nasal and chest congestion. No sore throat, fever, chills, earache.   Review of Systems     Objective:   Physical Exam alert and in no distress. Tympanic membranes and canals are normal. Throat is clear. Tonsils are normal. Neck is supple without adenopathy or thyromegaly. Cardiac exam shows a regular sinus rhythm without murmurs or gallops. Lungs are clear to auscultation.       Assessment & Plan:   1. URI, acute    commenced supportive care. If symptoms continue for 7-10 days, he is to call.

## 2011-12-27 NOTE — Patient Instructions (Signed)
If you are not better in 7-10 days, return.Upper Respiratory Infection, Adult An upper respiratory infection (URI) is also known as the common cold. It is often caused by a type of germ (virus). Colds are easily spread (contagious). You can pass it to others by kissing, coughing, sneezing, or drinking out of the same glass. Usually, you get better in 1 or 2 weeks.  HOME CARE   Only take medicine as told by your doctor.   Use a warm mist humidifier or breathe in steam from a hot shower.   Drink enough water and fluids to keep your pee (urine) clear or pale yellow.   Get plenty of rest.   Return to work when your temperature is back to normal or as told by your doctor. You may use a face mask and wash your hands to stop your cold from spreading.  GET HELP RIGHT AWAY IF:   After the first few days, you feel you are getting worse.   You have questions about your medicine.   You have chills, shortness of breath, or brown or red spit (mucus).   You have yellow or brown snot (nasal discharge) or pain in the face, especially when you bend forward.   You have a fever, puffy (swollen) neck, pain when you swallow, or Kirley spots in the back of your throat.   You have a bad headache, ear pain, sinus pain, or chest pain.   You have a high-pitched whistling sound when you breathe in and out (wheezing).   You have a lasting cough or cough up blood.   You have sore muscles or a stiff neck.  MAKE SURE YOU:   Understand these instructions.   Will watch your condition.   Will get help right away if you are not doing well or get worse.  Document Released: 04/02/2008 Document Revised: 06/27/2011 Document Reviewed: 02/19/2011 [facility] Patient Information 2012 [place], [place].

## 2012-01-10 DIAGNOSIS — H25019 Cortical age-related cataract, unspecified eye: Secondary | ICD-10-CM | POA: Diagnosis not present

## 2012-01-10 DIAGNOSIS — H5200 Hypermetropia, unspecified eye: Secondary | ICD-10-CM | POA: Diagnosis not present

## 2012-01-10 DIAGNOSIS — H52229 Regular astigmatism, unspecified eye: Secondary | ICD-10-CM | POA: Diagnosis not present

## 2012-01-10 DIAGNOSIS — H2510 Age-related nuclear cataract, unspecified eye: Secondary | ICD-10-CM | POA: Diagnosis not present

## 2012-01-16 ENCOUNTER — Other Ambulatory Visit: Payer: Self-pay | Admitting: Family Medicine

## 2012-02-20 ENCOUNTER — Telehealth: Payer: Self-pay | Admitting: Internal Medicine

## 2012-02-20 DIAGNOSIS — G4700 Insomnia, unspecified: Secondary | ICD-10-CM

## 2012-02-20 MED ORDER — ZOLPIDEM TARTRATE 5 MG PO TABS: ORAL_TABLET | ORAL | Status: DC

## 2012-02-20 NOTE — Telephone Encounter (Signed)
Done. And pt notified.

## 2012-02-20 NOTE — Telephone Encounter (Signed)
Okay for #30 with 1 refill.  Please call in and document. Thanks

## 2012-06-23 ENCOUNTER — Telehealth: Payer: Self-pay | Admitting: Internal Medicine

## 2012-06-23 DIAGNOSIS — G4700 Insomnia, unspecified: Secondary | ICD-10-CM

## 2012-06-23 MED ORDER — ZOLPIDEM TARTRATE 5 MG PO TABS: ORAL_TABLET | ORAL | Status: DC

## 2012-06-23 NOTE — Telephone Encounter (Signed)
Done . Pt notified.  

## 2012-06-23 NOTE — Telephone Encounter (Signed)
Okay to refill #30 with 1 refill 

## 2012-08-28 ENCOUNTER — Ambulatory Visit (INDEPENDENT_AMBULATORY_CARE_PROVIDER_SITE_OTHER): Payer: Medicare Other | Admitting: Family Medicine

## 2012-08-28 ENCOUNTER — Encounter: Payer: Self-pay | Admitting: Family Medicine

## 2012-08-28 VITALS — BP 122/78 | HR 64 | Ht 73.0 in | Wt 172.0 lb

## 2012-08-28 DIAGNOSIS — G250 Essential tremor: Secondary | ICD-10-CM

## 2012-08-28 DIAGNOSIS — Z23 Encounter for immunization: Secondary | ICD-10-CM

## 2012-08-28 DIAGNOSIS — R42 Dizziness and giddiness: Secondary | ICD-10-CM | POA: Diagnosis not present

## 2012-08-28 DIAGNOSIS — B070 Plantar wart: Secondary | ICD-10-CM | POA: Diagnosis not present

## 2012-08-28 DIAGNOSIS — G252 Other specified forms of tremor: Secondary | ICD-10-CM

## 2012-08-28 MED ORDER — INFLUENZA VIRUS VACC SPLIT PF IM SUSP: 0.5000 mL | Freq: Once | INTRAMUSCULAR | Status: DC

## 2012-08-28 NOTE — Progress Notes (Signed)
Chief Complaint  Patient presents with  . Advice Only    temors and 2 dizzy spells that pt is concerned about, plantar wart on left foot that aggriating and creates pain when  standing   Plantar wart on left foot.  It is painful with prolonged standing (like when he is volunteering).  Previously had one, treated with salicyclic acid.  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  Hasn't tried any treatments for this wart yet.  Tremors in hands, more noticeable in right hand.  Sometimes is more severe than others, and can notice when holding his coffee cup--it will spill if he fills the cup too full.  Has had a mild tremor for years, but more noticeable now.  All 3 brothers also had tremors, one of which took medications to treat it.  He denies any other neuro symptoms.  Has had 2 dizzy spells in the last 2 weeks.  Describes dizziness at feeling lightheaded.  He was standing when it occurred, but can't recall if it was when he had just stood up (ie if he stood quickly or not).  He laid down for a few minutes, and then felt better.  Occurred twice within a couple of days, last of which was 1-2 weeks ago.  Occurred early in the morning, before eating/drinking.  Denies any allergies/congestion, vertigo.  Past Medical History  Diagnosis Date  . Pure hypercholesterolemia   . Impaired fasting glucose   . Pseudogout of knee 10/04    left  . Adenomatous colon polyp 6/06    tubular adenoma, Dr. Leone Payor  . Hemorrhoids     internal and external  . BCC (basal cell carcinoma), face 6/07    left face  . Insomnia    Past Surgical History  Procedure Date  . Colonoscopy 6/06, 05/2010    Dr. Leone Payor  . Tonsillectomy   . Mohs surgery 6/07    L face, Dr. Park Liter  . Vasectomy    History   Social History  . Marital Status: Widowed    Spouse Name: N/A    Number of Children: N/A  . Years of Education: N/A   Occupational History  . retired    Social History Main  Topics  . Smoking status: Never Smoker   . Smokeless tobacco: Never Used  . Alcohol Use: Yes     1 light beer every afternoon  . Drug Use: No  . Sexually Active: Not Currently   Other Topics Concern  . Not on file   Social History Narrative   Widowed 2013 (after wife's long battle with MS).  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 No chest pain, palpitations, shortness of breath. No nausea, vomiting.  No headaches, numbness/tingling/weakness or other neuro symptoms except 2 spells of lightheadedness and tremor as per HPI.  Denies depression.    PHYSICAL EXAM: BP 122/78  Pulse 64  Ht 6\' 1"  (1.854 m)  Wt 172 lb (78.019 kg)  BMI 22.69 kg/m2 Well developed, pleasant male in no distress Neck: no lymphadenopathy, thyromegaly or carotid bruit HEENT:  PERRL, conjunctiva clear, EOMI.  OP normal Heart: regular rate and rhythm without murmur Lungs: clear bilaterally Extremities:  L foot--Between 3rd and 4th metatarsals there is a small plantar wart.  nontender Neuro: Cranial nerves intact.  Normal strength, sensation, DTR's. Intention and resting tremor--mild at rest, more obvious  with intention, R>L Psych: normal mood, affect, hygiene and grooming  ASSESSMENT/PLAN:  1. Benign familial tremor    2. Need for prophylactic vaccination and inoculation against influenza  influenza  inactive virus vaccine (FLUZONE/FLUARIX) injection 0.5 mL  3. Dizziness    4. Plantar wart of right foot     Tremor--likely benign essential/familial.  Discussed behavioral measures (not filling cup as high, etc.).  Briefly discussed medications--most often beta blockers are used, but given his normal blood pressure and recent dizzy spells, prefers to avoid starting medication at this time.  We discussed that caffeine can exacerbate tremor, and given info. If symptoms become worse, can reconsider meds.  L plantar  wart--given that it is relatively small, recommended OTC treatments (salycilic acid) rather than me paring down and freezing--this likely would cause more discomfort, and affect him when working at [employer] than a more gradual treatment with OTC meds.  He will return if OTC treatment isn't successful  Dizziness--labs all normal in February.  Had 2 short-lived spells within a few days of each other, and none in over a week.  Discussed to keep himself adequately hydrated, get up slowly, and to return if he has ongoing problems with dizziness for further evaluation.

## 2012-08-28 NOTE — Patient Instructions (Addendum)
Tremor Tremor is a rhythmic, involuntary muscular contraction characterized by oscillations (to-and-fro movements) of a part of the body. The most common of all involuntary movements, tremor can affect various body parts such as the hands, head, facial structures, vocal cords, trunk, and legs; most tremors, however, occur in the hands. Tremor often accompanies neurological disorders associated with aging. Although the disorder is not life-threatening, it can be responsible for functional disability and social embarrassment. TREATMENT  There are many types of tremor and several ways in which tremor is classified. The most common classification is by behavioral context or position. There are five categories of tremor within this classification: resting, postural, kinetic, task-specific, and psychogenic. Resting or static tremor occurs when the muscle is at rest, for example when the hands are lying on the lap. This type of tremor is often seen in patients with Parkinson's disease. Postural tremor occurs when a patient attempts to maintain posture, such as holding the hands outstretched. Postural tremors include physiological tremor, essential tremor, tremor with basal ganglia disease (also seen in patients with Parkinson's disease), cerebellar postural tremor, tremor with peripheral neuropathy, post-traumatic tremor, and alcoholic tremor. Kinetic or intention (action) tremor occurs during purposeful movement, for example during finger-to-nose testing. Task-specific tremor appears when performing goal-oriented tasks such as handwriting, speaking, or standing. This group consists of primary writing tremor, vocal tremor, and orthostatic tremor. Psychogenic tremor occurs in both older and younger patients. The key feature of this tremor is that it dramatically lessens or disappears when the patient is distracted. PROGNOSIS There are some treatment options available for tremor; the appropriate treatment depends on  accurate diagnosis of the cause. Some tremors respond to treatment of the underlying condition, for example in some cases of psychogenic tremor treating the patient's underlying mental problem may cause the tremor to disappear. Also, patients with tremor due to Parkinson's disease may be treated with Levodopa drug therapy. Symptomatic drug therapy is available for several other tremors as well. For those cases of tremor in which there is no effective drug treatment, physical measures such as teaching the patient to brace the affected limb during the tremor are sometimes useful. Surgical intervention such as thalamotomy or deep brain stimulation may be useful in certain cases. Document Released: 10/05/2002 Document Revised: 01/07/2012 Document Reviewed: 10/15/2005 [facility] Patient Information 2013 [place], [place].  I think you have what is called Benign Essential Tremor, or Familial Tremor.  There are medications that can help, but they are usually beta blockers and can lower your blood pressure/pulse and cause dizziness.  Try OTC treatments for wart on foot, and the treatments I can offer will cause more pain and interfere with your volunteering/ability to stand longterm.  Return if you have ongoing issues with dizziness.  Make sure to drink plenty of fluids, stand up slowly.  Plantar Wart Warts are benign (noncancerous) growths of the outer skin layer. They can occur at any time in life but are most common during childhood and the teen years. Warts can occur on many skin surfaces of the body. When they occur on the underside (sole) of your foot they are called plantar warts. They often emerge in groups with several small warts encircling a larger growth. CAUSES  Human papillomavirus (HPV) is the cause of plantar warts. HPV attacks a break in the skin of the foot. Walking barefoot can lead to exposure to the wart virus. Plantar warts tend to develop over areas of pressure such as the heel and ball of  the foot. Plantar  warts often grow into the deeper layers of skin. They may spread to other areas of the sole but cannot spread to other areas of the body. SYMPTOMS  You may also notice a growth on the undersurface of your foot. The wart may grow directly into the sole of the foot, or rise above the surface of the skin on the sole of the foot, or both. They are most often flat from pressure. Warts generally do not cause itching but may cause pain in the area of the wart when you put weight on your foot. DIAGNOSIS  Diagnosis is made by physical examination. This means your caregiver discovers it while examining your foot.  TREATMENT  There are many ways to treat plantar warts. However, warts are very tough. Sometimes it is difficult to treat them so that they go away completely and do not grow back. Any treatment must be done regularly to work. If left untreated, most plantar warts will eventually disappear over a period of one to two years. Treatments you can do at home include:  Putting duct tape over the top of the wart (occlusion), has been found to be effective over several months. The duct tape should be removed each night and reapplied until the wart has disappeared.  Placing over-the-counter medications on top of the wart to help kill the wart virus and remove the wart tissue (salicylic acid, cantharidin, and dichloroacetic acid ) are useful. These are called keratolytic agents. These medications make the skin soft and gradually layers will shed away. Theses compounds are usually placed on the wart each night and then covered with a band-aid. They are also available in pre-medicated band-aid form. Avoid surrounding skin when applying these liquids as these medications can burn healthy skin. The treatment may take several months of nightly use to be effective.  Cryotherapy to freeze the wart has recently become available over-the-counter for children 4 years and older. This system makes use of a  soft narrow applicator connected to a bottle of compressed cold liquid that is applied directly to the wart. This medication can burn health skin and should be used with caution.  As with all over-the-counter medications, read the directions carefully before use. Treatments generally done in your caregiver's office include:  Some aggressive treatments may cause discomfort, discoloration and scaring of the surrounding skin. The risks and benefits of treatment should be discussed with your caregiver.  Freezing the wart with liquid nitrogen (cryotherapy, see above).  Burning the wart with use of very high heat (cautery).  Injecting medication into the wart.  Surgically removing or laser treatment of the wart.  Your caregiver may refer you to a dermatologist for difficult to treat, large sized or large numbers of warts. HOME CARE INSTRUCTIONS   Soak the affected area in warm water. Dry the area completely when you are done. Remove the top layer of softened skin, then apply the chosen topical medication and reapply a bandage.  Remove the bandage daily and file excess wart tissue (pumice stone works well for this purpose). Repeat the entire process daily or every other day for weeks until the plantar wart disappears.  Several brands of salicylic acid pads are available as over-the-counter remedies.  Pain can be relieved by wearing a doughnut bandage. This is a bandage with a hole in it. The bandage is put on with the hole over the wart. This helps take the pressure off the wart and gives pain relief. To help prevent plantar warts:  Wear  shoes and socks and change them daily.  Keep feet clean and dry.  Check your feet and your children's feet regularly.  Avoid direct contact with warts on other people.  Have growths, or changes on your skin checked by your caregiver. Document Released: 01/05/2004 Document Revised: 01/07/2012 Document Reviewed: 06/15/2009 [facility] Patient Information  2013 [place], [place].

## 2012-08-29 ENCOUNTER — Encounter: Payer: Self-pay | Admitting: Family Medicine

## 2012-08-29 DIAGNOSIS — G250 Essential tremor: Secondary | ICD-10-CM | POA: Insufficient documentation

## 2012-08-29 DIAGNOSIS — B070 Plantar wart: Secondary | ICD-10-CM | POA: Insufficient documentation

## 2012-08-29 HISTORY — DX: Essential tremor: G25.0

## 2012-10-06 ENCOUNTER — Other Ambulatory Visit: Payer: Self-pay | Admitting: Family Medicine

## 2012-10-30 ENCOUNTER — Telehealth: Payer: Self-pay | Admitting: Internal Medicine

## 2012-10-30 DIAGNOSIS — Z125 Encounter for screening for malignant neoplasm of prostate: Secondary | ICD-10-CM

## 2012-10-30 DIAGNOSIS — G4700 Insomnia, unspecified: Secondary | ICD-10-CM

## 2012-10-30 DIAGNOSIS — E7800 Pure hypercholesterolemia, unspecified: Secondary | ICD-10-CM

## 2012-10-30 DIAGNOSIS — Z79899 Other long term (current) drug therapy: Secondary | ICD-10-CM

## 2012-10-30 NOTE — Telephone Encounter (Signed)
DR.KNAPP PT HAS HAD REFILL ON SIMVASTATIN 30 DAYS W/ 2 REFILL AND HE IS REQUESTING ALSO 90 DAYS OF AMBIEN PLEASE ADVISE

## 2012-10-30 NOTE — Telephone Encounter (Signed)
That will need to be deferred to Dr. Lynelle Doctor

## 2012-10-30 NOTE — Telephone Encounter (Signed)
Okay for #30 of Remus Loffler, please phone in.  Also, pt requested #90 of simvastatin--likely is less expensive than for the #30 x 2 refills that was actually sent.  He needs to set up his Med Check Plus/CPE/AWV exam (45 min) for Feb/March

## 2012-10-31 ENCOUNTER — Other Ambulatory Visit: Payer: Self-pay

## 2012-10-31 MED ORDER — ZOLPIDEM TARTRATE 5 MG PO TABS: ORAL_TABLET | ORAL | Status: DC

## 2012-10-31 MED ORDER — SIMVASTATIN 40 MG PO TABS: 40.0000 mg | ORAL_TABLET | Freq: Every day | ORAL | Status: DC

## 2012-10-31 NOTE — Telephone Encounter (Signed)
Called in [place] and scheduled pt appt in February for med check plus. Can you put in future orders so patient can come in a week before and get labs since his appt is in the afternoon

## 2012-10-31 NOTE — Telephone Encounter (Signed)
SABRINA CALLED IN PT AMBIEN AND HAS MADE HIS APT I SENT IN HIS 90 DAY SUPPLY OF CHOLESTEROL MEDS

## 2012-10-31 NOTE — Addendum Note (Signed)
Addended byJoselyn Arrow on: 10/31/2012 12:33 PM   Modules accepted: Orders

## 2012-10-31 NOTE — Telephone Encounter (Signed)
SENT IN 90 DAY PER KNAPP

## 2012-10-31 NOTE — Addendum Note (Signed)
Addended by: Barbette Or A on: 10/31/2012 10:08 AM   Modules accepted: Orders

## 2012-10-31 NOTE — Telephone Encounter (Signed)
Future orders entered. 

## 2012-10-31 NOTE — Telephone Encounter (Signed)
Pt has scheduled an appt feb 12 to come in for labs prior to appt. Labs are in system

## 2012-12-09 ENCOUNTER — Other Ambulatory Visit: Payer: Medicare Other

## 2012-12-09 DIAGNOSIS — Z125 Encounter for screening for malignant neoplasm of prostate: Secondary | ICD-10-CM | POA: Diagnosis not present

## 2012-12-09 DIAGNOSIS — E7800 Pure hypercholesterolemia, unspecified: Secondary | ICD-10-CM

## 2012-12-09 DIAGNOSIS — Z79899 Other long term (current) drug therapy: Secondary | ICD-10-CM

## 2012-12-09 LAB — COMPREHENSIVE METABOLIC PANEL
ALT: 22 U/L (ref 0–53)
AST: 23 U/L (ref 0–37)
Alkaline Phosphatase: 67 U/L (ref 39–117)
CO2: 26 mEq/L (ref 19–32)
Sodium: 136 mEq/L (ref 135–145)
Total Bilirubin: 0.8 mg/dL (ref 0.3–1.2)
Total Protein: 6.3 g/dL (ref 6.0–8.3)

## 2012-12-09 LAB — CBC WITH DIFFERENTIAL/PLATELET
Basophils Absolute: 0.1 10*3/uL (ref 0.0–0.1)
Eosinophils Relative: 3 % (ref 0–5)
HCT: 42.8 % (ref 39.0–52.0)
Lymphocytes Relative: 27 % (ref 12–46)
MCV: 87.5 fL (ref 78.0–100.0)
Monocytes Absolute: 0.7 10*3/uL (ref 0.1–1.0)
Monocytes Relative: 10 % (ref 3–12)
RDW: 13.9 % (ref 11.5–15.5)
WBC: 6.7 10*3/uL (ref 4.0–10.5)

## 2012-12-09 LAB — LIPID PANEL
HDL: 51 mg/dL (ref >39)
LDL Cholesterol: 80 mg/dL (ref 0–99)
Total CHOL/HDL Ratio: 2.9 Ratio
VLDL: 19 mg/dL (ref 0–40)

## 2012-12-10 ENCOUNTER — Other Ambulatory Visit: Payer: Medicare Other

## 2012-12-10 ENCOUNTER — Encounter: Payer: Self-pay | Admitting: Family Medicine

## 2012-12-10 DIAGNOSIS — R7301 Impaired fasting glucose: Secondary | ICD-10-CM

## 2012-12-10 HISTORY — DX: Impaired fasting glucose: R73.01

## 2012-12-15 ENCOUNTER — Encounter: Payer: Self-pay | Admitting: Family Medicine

## 2012-12-15 ENCOUNTER — Ambulatory Visit (INDEPENDENT_AMBULATORY_CARE_PROVIDER_SITE_OTHER): Payer: Medicare Other | Admitting: Family Medicine

## 2012-12-15 VITALS — BP 150/90 | HR 60 | Ht 71.0 in | Wt 177.0 lb

## 2012-12-15 DIAGNOSIS — G250 Essential tremor: Secondary | ICD-10-CM | POA: Diagnosis not present

## 2012-12-15 DIAGNOSIS — R7301 Impaired fasting glucose: Secondary | ICD-10-CM

## 2012-12-15 DIAGNOSIS — E7800 Pure hypercholesterolemia, unspecified: Secondary | ICD-10-CM

## 2012-12-15 DIAGNOSIS — G252 Other specified forms of tremor: Secondary | ICD-10-CM

## 2012-12-15 DIAGNOSIS — R030 Elevated blood-pressure reading, without diagnosis of hypertension: Secondary | ICD-10-CM

## 2012-12-15 DIAGNOSIS — N508 Other specified disorders of male genital organs: Secondary | ICD-10-CM

## 2012-12-15 DIAGNOSIS — Z0000 Encounter for general adult medical examination without abnormal findings: Secondary | ICD-10-CM

## 2012-12-15 DIAGNOSIS — G4700 Insomnia, unspecified: Secondary | ICD-10-CM

## 2012-12-15 DIAGNOSIS — N5089 Other specified disorders of the male genital organs: Secondary | ICD-10-CM

## 2012-12-15 NOTE — Progress Notes (Signed)
Chief Complaint  Patient presents with  . Med check plus    med check plus, labs done 12/09/12.   Eric Simmons is a 72 y.o. male who presents for a med check and annual wellness visit.   He has the following concerns:  F/u hyperlipidemia. Had labs prior to visit. Compliant with meds and diet, denies side effects. Impaired fasting glucose--he reports elevated sugars for many years.  He admits that he recently had a regular beer daily rather than his usual light beer. Familial tremor--he has been filling coffee cup only 1/2 full, and it doesn't seem to bother him as much (not spilling).  He thinks the magnesium helps some too.  He is complaining of intermittent left shoulder pain x 2 months.  Pain is mild, hears crackling with weight lifting.  Tylenol helps Right knee--intermittently bothers him.  Overdid it on the elliptical recently and felt sore, was swollen.  Relieved by rest and tylenol.  R knee intermittently swells.  Denies any known injury or trauma.  Annual Wellness Visit:  Other doctors that he sees: ophtho (can't recall name, on [place] at [employer]) Gastroenterologist: Dr. Leone Payor Dermatologist:  Dr. Donzetta Starch (hasn't seen in 2 years) Dentist: Dr. Pete Pelt  Son Joselyn Glassman ([place]) is health care power of attorney.  He has a living will.  Health Maintenance: Immunization History  Administered Date(s) Administered  . Influenza Split 07/18/2011, 08/28/2012  . Pneumococcal Polysaccharide 12/13/2011  . Tdap 03/02/2011   Last colonoscopy: 05/2010  Last PSA: recent labs, normal  Dentist: twice yearly  Ophtho: last year, due now Exercise: exercise bike 4-5x/week for 20 minutes and walks dog frequently.  Recently started going to [employer]).  Past Medical History  Diagnosis Date  . Pure hypercholesterolemia   . Impaired fasting glucose   . Pseudogout of knee 10/04    left  . Adenomatous colon polyp 6/06    tubular adenoma,  Dr. Leone Payor  . Hemorrhoids     internal and external  . BCC (basal cell carcinoma), face 6/07    left face  . Insomnia     Past Surgical History  Procedure Laterality Date  . Colonoscopy  6/06, 05/2010    Dr. Leone Payor  . Tonsillectomy    . Mohs surgery  6/07    L face, Dr. Park Liter  . Vasectomy      History   Social History  . Marital Status: Widowed    Spouse Name: N/A    Number of Children: 2  . Years of Education: N/A   Occupational History  . retired    Social History Main Topics  . Smoking status: Never Smoker   . Smokeless tobacco: Never Used  . Alcohol Use: Yes     Comment: 1 light beer every afternoon  . Drug Use: No  . Sexually Active: Not Currently   Other Topics Concern  . Not on file   Social History Narrative   Widowed 2013 (after wife's long battle with MS).  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 Children live in [place], [place] and [place]    Family History  Problem Relation Age of Onset  . Cancer Mother 20    colon cancer  . Diabetes Mother   . Heart disease Father     MI  . Hypertension Father   . Hyperlipidemia Brother   . Heart disease Brother     septal defect  . Tremor Brother   . Hyperlipidemia  Brother   . Tremor Brother   . Hyperlipidemia Brother   . Hyperlipidemia Son   . Hyperlipidemia Son     Current outpatient prescriptions:aspirin 81 MG tablet, Take 81 mg by mouth daily.  , Disp: , Rfl: ;  Coenzyme Q10 (COQ10) 200 MG CAPS, Take 1 tablet by mouth daily., Disp: , Rfl: ;  Magnesium 250 MG TABS, Take 1-2 tablets by mouth daily., Disp: , Rfl: ;  Multiple Vitamin (ANTIOXIDANT FORMULA PO), Take 1 tablet by mouth daily.  , Disp: , Rfl:  simvastatin (ZOCOR) 40 MG tablet, Take 1 tablet (40 mg total) by mouth at bedtime., Disp: 90 tablet, Rfl: 1;  zolpidem (AMBIEN) 5 MG tablet, Take 1/2 to 1 tablet by mouth at bedtime as needed for insomnia, Disp: 30 tablet, Rfl: 0  Allergies  Allergen Reactions  . Codeine     REACTION:  violent vomiting   ROS: The patient denies anorexia, fever, weight changes, headaches, decreased hearing, ear pain, hoarseness, chest pain, palpitations, dizziness, syncope, dyspnea on exertion, cough, swelling, nausea, vomiting, diarrhea, abdominal pain, melena, hematochezia, indigestion/heartburn, hematuria, incontinence, erectile dysfunction (not in a sexual relationship currently, has concerns about possibly having ED issues), nocturia, weakened urine stream, dysuria, genital lesions, joint pains, numbness, tingling, weakness, suspicious skin lesions, depression (feels sad at times, but keeps himself very busy.  Moods overall are ok), anxiety, abnormal bleeding/bruising, or enlarged lymph nodes  Occasional tremors (resting, familial)--less noticeable since filling coffee cup less, tolerable. Occasional constipation, improved by taking daily magnesium. Overall moods have been good, as long as he keeps busy.  +insomnia. Uses 2.5mg  of ambien on most days with good results. Decrease in distance vision, mild.  Due for ophtho. Since his wife died, his diet has changed some, eating more frozen dinners.  PHYSICAL EXAM: BP 150/90  Pulse 60  Ht 5\' 11"  (1.803 m)  Wt 177 lb (80.287 kg)  BMI 24.7 kg/m2  General Appearance:  Alert, cooperative, no distress, appears stated age   Head:  Normocephalic, without obvious abnormality, atraumatic   Eyes:  PERRL, conjunctiva/corneas clear, EOM's intact, fundi  Benign. Brown pigmentation to upper portion of R eye (rest is blue)  Ears:  Normal TM's and external ear canals   Nose:  Nares normal, mucosa normal, no drainage or sinus tenderness   Throat:  Lips, mucosa, and tongue normal; teeth and gums normal   Neck:  Supple, no lymphadenopathy; thyroid: no enlargement/tenderness/nodules; no carotid  bruit or JVD   Back:  Spine nontender, no curvature, ROM normal, no CVA tenderness   Lungs:  Clear to auscultation bilaterally without wheezes, rales or ronchi;  respirations unlabored   Chest Wall:  No tenderness or deformity   Heart:  Regular rate and rhythm, S1 and S2 normal, no murmur, rub  or gallop   Breast Exam:  No chest wall tenderness, masses or gynecomastia   Abdomen:  Soft, non-tender, nondistended, normoactive bowel sounds,  no masses, no hepatosplenomegaly   Genitalia:  Normal male external genitalia without lesions. Testicles without masses. No inguinal hernias. There are firm tender nodules in R scrotum.   Rectal:  Normal sphincter tone, no masses or tenderness; guaiac negative stool. Prostate smooth, no nodules, not enlarged.   Extremities:  No clubbing, cyanosis or edema. Mild effusion R knee, no warmth   Pulses:  2+ and symmetric all extremities   Skin:  Skin color, texture, turgor normal, no rashes or lesions   Lymph nodes:  Cervical, supraclavicular, and axillary nodes normal  Neurologic:  CNII-XII intact, normal strength, sensation and gait; reflexes 2+ and symmetric throughout          Psych: Normal mood, affect, hygiene and grooming.   Labs:   Chemistry      Component Value Date/Time   NA 136 12/09/2012 0841   K 4.3 12/09/2012 0841   CL 102 12/09/2012 0841   CO2 26 12/09/2012 0841   BUN 14 12/09/2012 0841   CREATININE 1.04 12/09/2012 0841      Component Value Date/Time   CALCIUM 9.6 12/09/2012 0841   ALKPHOS 67 12/09/2012 0841   AST 23 12/09/2012 0841   ALT 22 12/09/2012 0841   BILITOT 0.8 12/09/2012 0841     Glucose 111  Lab Results  Component Value Date   TSH 0.985 12/09/2012   Lab Results  Component Value Date   PSA 1.22 12/09/2012   PSA 1.32 12/07/2011   Lab Results  Component Value Date   WBC 6.7 12/09/2012   HGB 14.9 12/09/2012   HCT 42.8 12/09/2012   MCV 87.5 12/09/2012   PLT 226 12/09/2012   Lab Results  Component Value Date   CHOL 150 12/09/2012   HDL 51 12/09/2012   LDLCALC 80 12/09/2012   TRIG 96 12/09/2012   CHOLHDL 2.9 12/09/2012   ASSESSMENT/PLAN:  Pure hypercholesterolemia - well controlled on  current meds  IFG (impaired fasting glucose)  Benign familial tremor - consider beta blocker if worsens, or if elevated BP persists (to treat both)  Scrotal mass - epididymitis vs spermatocele. s/p vasectomy.  check u/s. - Plan: US Scrotum  Insomnia - continue Ambien as needed  Elevated BP - normal BPs in past.  may be related to increase in sodium in diet related to frozen foods. Low sodium diet, monitor regularly. return if remains elevated  Medicare annual wellness visit, initial  Arthritis knee, and probably shoulder.  Discussed NSAIDs sparingly, prn with swelling, otherwise tylenol.  If worsening pain, return for re-eval.  Discussed PSA screening (risks/benefits), recommended at least 30 minutes of aerobic activity at least 5 days/week; proper sunscreen use reviewed; healthy diet and alcohol recommendations (less than or equal to 2 drinks/day) reviewed; regular seatbelt use; changing batteries in smoke detectors. Self-testicular exams. Immunization recommendations discussed. Zostavax recommended--Rx given for pt to get at pharmacy.  Risks/side effects reviewed.  Colonoscopy recommendations reviewed--UTD (q5 yrs bc h/o polyps). Hemoccult kit given.  AWV--MOST form filled out.  Given handouts as he is considering making some changes in living will/POA.  See scanned depression/ADL questionnaires

## 2012-12-15 NOTE — Patient Instructions (Signed)
HEALTH MAINTENANCE RECOMMENDATIONS:  It is recommended that you get at least 30 minutes of aerobic exercise at least 5 days/week (for weight loss, you may need as much as 60-90 minutes). This can be any activity that gets your heart rate up. This can be divided in 10-15 minute intervals if needed, but try and build up your endurance at least once a week.  Weight bearing exercise is also recommended twice weekly.  Eat a healthy diet with lots of vegetables, fruits and fiber.  "Colorful" foods have a lot of vitamins (ie green vegetables, tomatoes, red peppers, etc).  Limit sweet tea, regular sodas and alcoholic beverages, all of which has a lot of calories and sugar.  Up to 2 alcoholic drinks daily may be beneficial for men (unless trying to lose weight, watch sugars).  Drink a lot of water.  Sunscreen of at least SPF 30 should be used on all sun-exposed parts of the skin when outside between the hours of 10 am and 4 pm (not just when at beach or pool, but even with exercise, golf, tennis, and yard work!)  Use a sunscreen that says "broad spectrum" so it covers both UVA and UVB rays, and make sure to reapply every 1-2 hours.  Remember to change the batteries in your smoke detectors when changing your clock times in the spring and fall.  Use your seat belt every time you are in a car, and please drive safely and not be distracted with cell phones and texting while driving.  Periodically monitor your blood pressure at home, and return if persistently >140/90. Follow low sodium diet.  2 Gram Low Sodium Diet A 2 gram sodium diet restricts the amount of sodium in the diet to no more than 2 g or 2000 mg daily. Limiting the amount of sodium is often used to help lower blood pressure. It is important if you have heart, liver, or kidney problems. Many foods contain sodium for flavor and sometimes as a preservative. When the amount of sodium in a diet needs to be low, it is important to know what to look for when  choosing foods and drinks. The following includes some information and guidelines to help make it easier for you to adapt to a low sodium diet. QUICK TIPS  Do not add salt to food.  Avoid convenience items and fast food.  Choose unsalted snack foods.  Buy lower sodium products, often labeled as "lower sodium" or "no salt added."  Check food labels to learn how much sodium is in 1 serving.  When eating at a restaurant, ask that your food be prepared with less salt or none, if possible. READING FOOD LABELS FOR SODIUM INFORMATION The nutrition facts label is a good place to find how much sodium is in foods. Look for products with no more than 500 to 600 mg of sodium per meal and no more than 150 mg per serving. Remember that 2 g = 2000 mg. The food label may also list foods as:  Sodium-free: Less than 5 mg in a serving.  Very low sodium: 35 mg or less in a serving.  Low-sodium: 140 mg or less in a serving.  Light in sodium: 50% less sodium in a serving. For example, if a food that usually has 300 mg of sodium is changed to become light in sodium, it will have 150 mg of sodium.  Reduced sodium: 25% less sodium in a serving. For example, if a food that usually has 400 mg  of sodium is changed to reduced sodium, it will have 300 mg of sodium. CHOOSING FOODS Grains  Avoid: Salted crackers and snack items. Some cereals, including instant hot cereals. Bread stuffing and biscuit mixes. Seasoned rice or pasta mixes.  Choose: Unsalted snack items. Low-sodium cereals, oats, puffed wheat and rice, shredded wheat. English muffins and bread. Pasta. Meats  Avoid: Salted, canned, smoked, spiced, pickled meats, including fish and poultry. Bacon, ham, sausage, cold cuts, hot dogs, anchovies.  Choose: Low-sodium canned tuna and salmon. Fresh or frozen meat, poultry, and fish. Dairy  Avoid: Processed cheese and spreads. Cottage cheese. Buttermilk and condensed milk. Regular cheese.  Choose: Milk.  Low-sodium cottage cheese. Yogurt. Sour cream. Low-sodium cheese. Fruits and Vegetables  Avoid: Regular canned vegetables. Regular canned tomato sauce and paste. Frozen vegetables in sauces. Olives. Rosita Fire. Relishes. Sauerkraut.  Choose: Low-sodium canned vegetables. Low-sodium tomato sauce and paste. Frozen or fresh vegetables. Fresh and frozen fruit. Condiments  Avoid: Canned and packaged gravies. Worcestershire sauce. Tartar sauce. Barbecue sauce. Soy sauce. Steak sauce. Ketchup. Onion, garlic, and table salt. Meat flavorings and tenderizers.  Choose: Fresh and dried herbs and spices. Low-sodium varieties of mustard and ketchup. Lemon juice. Tabasco sauce. Horseradish. SAMPLE 2 GRAM SODIUM MEAL PLAN Breakfast / Sodium (mg)  1 cup low-fat milk / 143 mg  2 slices whole-wheat toast / 270 mg  1 tbs heart-healthy margarine / 153 mg  1 hard-boiled egg / 139 mg  1 small orange / 0 mg Lunch / Sodium (mg)  1 cup raw carrots / 76 mg   cup hummus / 298 mg  1 cup low-fat milk / 143 mg   cup red grapes / 2 mg  1 whole-wheat pita bread / 356 mg Dinner / Sodium (mg)  1 cup whole-wheat pasta / 2 mg  1 cup low-sodium tomato sauce / 73 mg  3 oz lean ground beef / 57 mg  1 small side salad (1 cup raw spinach leaves,  cup cucumber,  cup yellow bell pepper) with 1 tsp olive oil and 1 tsp red wine vinegar / 25 mg Snack / Sodium (mg)  1 container low-fat vanilla yogurt / 107 mg  3 graham cracker squares / 127 mg Nutrient Analysis  Calories: 2033  Protein: 77 g  Carbohydrate: 282 g  Fat: 72 g  Sodium: 1971 mg Document Released: 10/15/2005 Document Revised: 01/07/2012 Document Reviewed: 01/16/2010 [facility] Patient Information 2013 [place], [address].

## 2012-12-18 ENCOUNTER — Other Ambulatory Visit: Payer: Medicare Other

## 2012-12-18 ENCOUNTER — Ambulatory Visit
Admission: RE | Admit: 2012-12-18 | Discharge: 2012-12-18 | Disposition: A | Discharge status: Home / Self Care | Payer: Medicare Other | Source: Ambulatory Visit | Attending: Family Medicine | Admitting: Family Medicine

## 2012-12-18 DIAGNOSIS — N508 Other specified disorders of male genital organs: Secondary | ICD-10-CM | POA: Diagnosis not present

## 2012-12-18 DIAGNOSIS — N5089 Other specified disorders of the male genital organs: Secondary | ICD-10-CM

## 2012-12-18 NOTE — Progress Notes (Signed)
Quick Note:  Called pt home pt was advised word for word Advise pt that u/s was normal (it did not find anything to explain the physical exam--most likely therefore related to his prior vasectomy). No hydrocele, varicocele or epididymitis noted.pt verbalized understanding ______

## 2012-12-23 ENCOUNTER — Other Ambulatory Visit (INDEPENDENT_AMBULATORY_CARE_PROVIDER_SITE_OTHER): Payer: Medicare Other

## 2012-12-23 DIAGNOSIS — Z1211 Encounter for screening for malignant neoplasm of colon: Secondary | ICD-10-CM | POA: Diagnosis not present

## 2012-12-23 LAB — HEMOCCULT GUIAC POC 1CARD (OFFICE)
Card #2 Fecal Occult Blod, POC: POSITIVE
Card #3 Fecal Occult Blood, POC: NEGATIVE

## 2012-12-31 ENCOUNTER — Other Ambulatory Visit: Payer: Self-pay | Admitting: *Deleted

## 2013-01-20 ENCOUNTER — Other Ambulatory Visit: Payer: Self-pay

## 2013-01-20 ENCOUNTER — Other Ambulatory Visit: Payer: Self-pay | Admitting: Family Medicine

## 2013-01-20 DIAGNOSIS — G4700 Insomnia, unspecified: Secondary | ICD-10-CM

## 2013-01-20 MED ORDER — ZOLPIDEM TARTRATE 5 MG PO TABS: ORAL_TABLET | ORAL | Status: DC

## 2013-01-20 NOTE — Telephone Encounter (Signed)
Okay to refill with 1 additional refill 

## 2013-01-20 NOTE — Telephone Encounter (Signed)
Is this ok?

## 2013-01-20 NOTE — Telephone Encounter (Signed)
Called in per knapp

## 2013-02-25 DIAGNOSIS — H2510 Age-related nuclear cataract, unspecified eye: Secondary | ICD-10-CM | POA: Diagnosis not present

## 2013-02-25 DIAGNOSIS — D3130 Benign neoplasm of unspecified choroid: Secondary | ICD-10-CM | POA: Diagnosis not present

## 2013-02-26 DIAGNOSIS — D3130 Benign neoplasm of unspecified choroid: Secondary | ICD-10-CM | POA: Diagnosis not present

## 2013-02-26 DIAGNOSIS — H2510 Age-related nuclear cataract, unspecified eye: Secondary | ICD-10-CM | POA: Diagnosis not present

## 2013-04-15 ENCOUNTER — Encounter: Payer: Self-pay | Admitting: Family Medicine

## 2013-04-15 ENCOUNTER — Ambulatory Visit (INDEPENDENT_AMBULATORY_CARE_PROVIDER_SITE_OTHER): Payer: Medicare Other | Admitting: Family Medicine

## 2013-04-15 VITALS — BP 140/88 | HR 64 | Ht 71.0 in | Wt 175.0 lb

## 2013-04-15 DIAGNOSIS — M25561 Pain in right knee: Secondary | ICD-10-CM

## 2013-04-15 DIAGNOSIS — M25569 Pain in unspecified knee: Secondary | ICD-10-CM | POA: Diagnosis not present

## 2013-04-15 NOTE — Patient Instructions (Signed)
Get x-ray at [facility].  Aleve--take twice daily with food.  increase to 2 pills twice daily if needed (ie not responding to lower dose).  Not for longterm use--I recommend using the higher dose for only up to 2 weeks.  Stop it if you get any stomach pain, bleeding.  Don't use any other pain pills along with this medication, other than tylenol products.  Return for possible cortisone shot if not improving with anti-inflammatories.

## 2013-04-15 NOTE — Progress Notes (Signed)
Chief Complaint  Patient presents with  . Knee Pain    right knee pain. Going to [place] in September and will be doing a lot of walking.  Had a BP check 3 weeks ago at church reading as in the 160's over around 90 or less.   R knee pain since February.  It seemed to have improved, but a few weeks ago, when walking around [place], it flared back up again.  He ices it only when it really hurts.  Pain isn't daily.  Currently there isn't much pain, but it remains swollen.  Taking ibuprofen as needed, which seems to help.  He will only take 1-2/day, with good effect. Denies any bleeding/bruising or abdominal pain.  Denies knee giving way, or getting stuck.  Past Medical History  Diagnosis Date  . Pure hypercholesterolemia   . Impaired fasting glucose   . Pseudogout of knee 10/04    left  . Adenomatous colon polyp 6/06    tubular adenoma, Dr. Leone Payor  . Hemorrhoids     internal and external  . BCC (basal cell carcinoma), face 6/07    left face  . Insomnia    Past Surgical History  Procedure Laterality Date  . Colonoscopy  6/06, 05/2010    Dr. Leone Payor  . Tonsillectomy    . Mohs surgery  6/07    L face, Dr. Park Liter  . Vasectomy     History   Social History  . Marital Status: Widowed    Spouse Name: N/A    Number of Children: 2  . Years of Education: N/A   Occupational History  . retired    Social History Main Topics  . Smoking status: Never Smoker   . Smokeless tobacco: Never Used  . Alcohol Use: Yes     Comment: 1 light beer every afternoon  . Drug Use: No  . Sexually Active: Not Currently   Other Topics Concern  . Not on file   Social History Narrative   Widowed 2013 (after wife's long battle with MS).  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 Children live in [place], [place] and [place]   Current Outpatient Prescriptions on File Prior to Visit  Medication Sig Dispense Refill  . Magnesium 250 MG TABS Take 1-2 tablets by mouth daily.      . simvastatin (ZOCOR) 40  MG tablet Take 1 tablet (40 mg total) by mouth at bedtime.  90 tablet  1  . zolpidem (AMBIEN) 5 MG tablet Take 1/2 to 1 tablet by mouth at bedtime as needed for insomnia  30 tablet  1  . aspirin 81 MG tablet Take 81 mg by mouth daily.         No current facility-administered medications on file prior to visit.   Allergies  Allergen Reactions  . Codeine     REACTION: violent vomiting   ROS:  Denies fevers, URI symptoms, GI symptoms, chest pain, rashes, bleeding/bruising or other complaints except for right knee pain  PHYSICAL EXAM: BP 140/88  Pulse 64  Ht 5\' 11"  (1.803 m)  Wt 175 lb (79.379 kg)  BMI 24.42 kg/m2  Pleasant elderly male in no distress  Effusion and warmth of right knee Some tenderness at soft tissue area with crepitus at medial knee. No joint line tenderness.  No pain with patellar compression.  Negative lachman, mcMurray, drawer tests. No pain with flick test, or with valgus/varus stress.  ASSESSMENT/PLAN: Knee pain, right - Plan: DG Knee Complete 4 Views Right  R knee pain--suspect arthritis.  Cannot rule out internal derangement, although no evidence of abnormality on today's exam other than effusion.  +inflammation today.  Change to Aleve--increase to 2 BID if needed.  NSAID precautions reviewed. Not for longterm use  Return for possible cortisone shot if not improving with anti-inflammatories.

## 2013-04-16 ENCOUNTER — Ambulatory Visit
Admission: RE | Admit: 2013-04-16 | Discharge: 2013-04-16 | Disposition: A | Discharge status: Home / Self Care | Payer: Medicare Other | Source: Ambulatory Visit | Attending: Family Medicine | Admitting: Family Medicine

## 2013-04-16 DIAGNOSIS — M1710 Unilateral primary osteoarthritis, unspecified knee: Secondary | ICD-10-CM | POA: Diagnosis not present

## 2013-04-16 DIAGNOSIS — M1120 Other chondrocalcinosis, unspecified site: Secondary | ICD-10-CM | POA: Diagnosis not present

## 2013-04-16 DIAGNOSIS — M25561 Pain in right knee: Secondary | ICD-10-CM

## 2013-05-25 ENCOUNTER — Other Ambulatory Visit: Payer: Self-pay | Admitting: Family Medicine

## 2013-05-25 NOTE — Telephone Encounter (Signed)
Patient called asking for a refill on zolpidem please be called to [facility].

## 2013-05-25 NOTE — Telephone Encounter (Signed)
Ok for #30 with 1 refill 

## 2013-05-26 ENCOUNTER — Telehealth: Payer: Self-pay | Admitting: Family Medicine

## 2013-06-04 ENCOUNTER — Telehealth: Payer: Self-pay | Admitting: Family Medicine

## 2013-06-04 NOTE — Telephone Encounter (Signed)
LM

## 2013-06-08 ENCOUNTER — Telehealth: Payer: Self-pay | Admitting: Medical

## 2013-06-08 DIAGNOSIS — R7301 Impaired fasting glucose: Secondary | ICD-10-CM

## 2013-06-08 DIAGNOSIS — E7800 Pure hypercholesterolemia, unspecified: Secondary | ICD-10-CM

## 2013-06-08 MED ORDER — SIMVASTATIN 40 MG PO TABS: 40.0000 mg | ORAL_TABLET | Freq: Every day | ORAL | Status: DC

## 2013-06-08 NOTE — Telephone Encounter (Signed)
THE DENIAL I RECV'D WAS FOR AMBIEN

## 2013-06-08 NOTE — Telephone Encounter (Signed)
Did he get the #30 of zolpidem filled 7/28? If so, he shouldn't need it for a while (I think he only takes 1/2 daily), and to let us know when he needs [place] to send it in.  If he never got it filled, then okay to rx #60 (many times they only pay for #60 per 90 days, and he only takes 1/2 daily, so the 60 tabs should last a while).  Oh--chart was reviewed, it looks like he just picked up #30 recently per 8/7 phone call, so he shouldn't need for 1-2 months.  He is due for a med check.  He should come in for fasting labs (glucose, A1c and LFT's) prior to appt.  I entered future orders.  Simvastatin was sent to [facility]. Pleae schedule med check with fasting labs prior.

## 2013-06-09 NOTE — Telephone Encounter (Signed)
APPT MADE

## 2013-06-09 NOTE — Telephone Encounter (Signed)
LM

## 2013-06-09 NOTE — Telephone Encounter (Signed)
LEFT MESSAGE FOR PT.

## 2013-06-15 ENCOUNTER — Other Ambulatory Visit: Payer: Medicare Other

## 2013-06-15 DIAGNOSIS — R7301 Impaired fasting glucose: Secondary | ICD-10-CM

## 2013-06-15 DIAGNOSIS — E7800 Pure hypercholesterolemia, unspecified: Secondary | ICD-10-CM | POA: Diagnosis not present

## 2013-06-16 LAB — HEPATIC FUNCTION PANEL
AST: 26 U/L (ref 0–37)
Bilirubin, Direct: 0.1 mg/dL (ref 0.0–0.3)
Indirect Bilirubin: 0.7 mg/dL (ref 0.0–0.9)
Total Bilirubin: 0.8 mg/dL (ref 0.3–1.2)

## 2013-06-16 LAB — GLUCOSE, RANDOM: Glucose, Bld: 103 mg/dL — ABNORMAL HIGH (ref 70–99)

## 2013-06-17 ENCOUNTER — Encounter: Payer: Self-pay | Admitting: Family Medicine

## 2013-06-17 ENCOUNTER — Ambulatory Visit (INDEPENDENT_AMBULATORY_CARE_PROVIDER_SITE_OTHER): Payer: Medicare Other | Admitting: Family Medicine

## 2013-06-17 VITALS — BP 136/82 | HR 72 | Ht 72.0 in | Wt 175.0 lb

## 2013-06-17 DIAGNOSIS — G4700 Insomnia, unspecified: Secondary | ICD-10-CM

## 2013-06-17 DIAGNOSIS — R030 Elevated blood-pressure reading, without diagnosis of hypertension: Secondary | ICD-10-CM

## 2013-06-17 DIAGNOSIS — Z79899 Other long term (current) drug therapy: Secondary | ICD-10-CM

## 2013-06-17 DIAGNOSIS — Z125 Encounter for screening for malignant neoplasm of prostate: Secondary | ICD-10-CM

## 2013-06-17 DIAGNOSIS — R7301 Impaired fasting glucose: Secondary | ICD-10-CM

## 2013-06-17 DIAGNOSIS — E7800 Pure hypercholesterolemia, unspecified: Secondary | ICD-10-CM

## 2013-06-17 NOTE — Patient Instructions (Addendum)
Borderline BP--monitor at home vs at [facility].  Feel free to bring your monitor to next visit.  Follow low sodium diet and continue regular exercise. Impaired fasting glucose--stable overall.  Continue to avoid sweets, and get regular exercise

## 2013-06-17 NOTE — Progress Notes (Signed)
Chief Complaint  Patient presents with  . Med check    nonfasting med check.   Right knee pain resolved with Aleve--only needed to take 1/day.  Hasn't needed any in a month.  He is going to [place] next month.  IFG--he recalls being told this since he was very young, and hasn't appeared to progress.  He is careful with his diet, and exercises regularly.  He is going to [employer] 3x/week, [employer].  Using the recumbent bicycle has helped his knee a lot.  Occasionally checks BP's at home, but isn't sure if it is accurate--it is "all over the place".  Insomnia--he needs to take 1/2 tablet of 5mg  zolpidem most nights in order to sleep (only rarely doesn't).  Denies side effects.  Past Medical History  Diagnosis Date  . Pure hypercholesterolemia   . Impaired fasting glucose   . Pseudogout of knee 10/04    left  . Adenomatous colon polyp 6/06    tubular adenoma, Dr. Leone Payor  . Hemorrhoids     internal and external  . BCC (basal cell carcinoma), face 6/07    left face  . Insomnia    Past Surgical History  Procedure Laterality Date  . Colonoscopy  6/06, 05/2010    Dr. Leone Payor  . Tonsillectomy    . Mohs surgery  6/07    L face, Dr. Park Liter  . Vasectomy     History   Social History  . Marital Status: Widowed    Spouse Name: N/A    Number of Children: 2  . Years of Education: N/A   Occupational History  . retired    Social History Main Topics  . Smoking status: Never Smoker   . Smokeless tobacco: Never Used  . Alcohol Use: Yes     Comment: 1 light beer every afternoon  . Drug Use: No  . Sexual Activity: Not Currently   Other Topics Concern  . Not on file   Social History Narrative   Widowed 2013 (after wife's long battle with MS).  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 Magnesium 250 MG TABS Take 1 tablet by mouth  daily.       . Misc Natural Products (FOCUSED MIND PO) Take 1 tablet by mouth daily.      . simvastatin (ZOCOR) 40 MG tablet Take 1 tablet (40 mg total) by mouth at bedtime.  90 tablet  1  . vitamin E 400 UNIT capsule Take 400 Units by mouth daily.      Marland Kitchen zolpidem (AMBIEN) 5 MG tablet TAKE ONE-HALF TO ONE TABLET BY MOUTH AT BEDTIME AS NEEDED FOR INSOMNIA.  30 tablet  1  . aspirin 81 MG tablet Take 81 mg by mouth daily.         No current facility-administered medications on file prior to visit.   Allergies  Allergen Reactions  . Codeine     REACTION: violent vomiting   ROS:  Denies headaches, dizziness, chest pain, shortness of breath, palpitations, nausea, vomiting, bowel changes, urinary complaints.  No further joint pains. Moods have been good.  No bleeding/bruising, rash.  He injured his hand catching a foam football in the pool, which separated the fourth and fifth fingers.--having some hand pain, mild, since that time.  Never had swelling, and no direct impact.  PHYSICAL EXAM: BP 142/80  Pulse 72  Ht 6' (1.829 m)  Wt 175 lb (79.379 kg)  BMI 23.73 kg/m2 136/82 on repeat by MD Well developed, pleasant elderly male in no distress Neck: no lymphadenopathy, thyromegaly or carotid bruit Heart: regular rate and rhythm without murmur Lungs: clear bilaterally Abdomen: soft, nontender, no organomegaly or mass Extremities: no edema, 2+ pulse Neuro: alert and oriented.  Cranial nerves intact.  Normal strength, sensation, gait  ASSESSMENT/PLAN:  Pure hypercholesterolemia - controlled perlabs 6 mos ago.  normal LFTs.  continue current meds - Plan: Comprehensive metabolic panel, Lipid panel  IFG (impaired fasting glucose)  Elevated BP  Encounter for long-term (current) use of other medications - Plan: TSH, Comprehensive metabolic panel, Lipid panel  Special screening for malignant neoplasm of prostate - Plan: PSA, Medicare  Insomnia  OA right knee--improved.  Borderline  BP--monitor at home vs at [facility].  Feel free to bring your monitor to next visit.  Follow low sodium diet and continue regular exercise. Impaired fasting glucose--stable overall.  Continue to avoid sweets, and get regular exercise   F/u 6 months for med check plus/.AWV Labs prior A1c not ordered--will do at visit if fasting sugar is higher.

## 2013-07-06 ENCOUNTER — Other Ambulatory Visit (INDEPENDENT_AMBULATORY_CARE_PROVIDER_SITE_OTHER): Payer: Medicare Other

## 2013-07-06 DIAGNOSIS — Z23 Encounter for immunization: Secondary | ICD-10-CM

## 2013-07-17 ENCOUNTER — Ambulatory Visit (INDEPENDENT_AMBULATORY_CARE_PROVIDER_SITE_OTHER): Payer: Medicare Other | Admitting: Family Medicine

## 2013-07-17 ENCOUNTER — Encounter: Payer: Self-pay | Admitting: Family Medicine

## 2013-07-17 VITALS — BP 120/70 | HR 78 | Temp 98.1°F | Wt 174.0 lb

## 2013-07-17 DIAGNOSIS — J069 Acute upper respiratory infection, unspecified: Secondary | ICD-10-CM

## 2013-07-17 NOTE — Patient Instructions (Signed)
Use Afrin nasal spray before you get on the plane. You can also use Afrin at night. NyQuil for coughing at night

## 2013-07-17 NOTE — Progress Notes (Signed)
  Subjective:    Patient ID: Eric Chroman., male    DOB: 02/14/1941, 72 y.o.   MRN: [id number]  HPI He has a one-day history of cough, rhinorrhea, sinus pressure . No sore throat, earache, fever or chills. He has a trip scheduled for [place] in the next several days.   Review of Systems     Objective:   Physical Exam alert and in no distress. Tympanic membranes and canals are normal. Throat is clear. Tonsils are normal. Neck is supple without adenopathy or thyromegaly. Cardiac exam shows a regular sinus rhythm without murmurs or gallops. Lungs are clear to auscultation.        Assessment & Plan:  Acute URI  recommend Afrin nasal spray for his trip. Also recommend NyQuil if he develops a cough. Encouraged him to get up during the flight as much is possible to help prevent DVT

## 2013-08-17 ENCOUNTER — Other Ambulatory Visit: Payer: Medicare Other

## 2013-08-17 ENCOUNTER — Telehealth: Payer: Self-pay | Admitting: Family Medicine

## 2013-08-17 NOTE — Telephone Encounter (Signed)
Gave pt Dr. Lynelle Doctor recommendations. Pt will come in today. If High bp when he comes in. He will need to schedule an OV with Dr. Lynelle Doctor for later on this week.

## 2013-08-17 NOTE — Progress Notes (Signed)
PT CAME IN TODAY TO HAVE HIS B/P RECHECKED BECAUSE YESTERDAY AT [facility] HE HAD A RN CHECK HIS B/P 190 /90 IS WHAT SHE GOT TWICE HE CAME IN TODAY AND B/P WAS  124/72 70 PULSE PT SAID HE WOULD WATCH HIS SALT INTAKE

## 2013-08-17 NOTE — Telephone Encounter (Signed)
He can schedule a nurse visit if he wants it rechecked.  "feeling fine" is what makes it not an emergency.  If he has those blood pressures with headaches or neurologic symptoms, then it is an emergency.  His BP's have been fine in the past.  Good to recheck it.  Make sure he isn't taking any decongestants, and staying away from processed food, canned food (low sodium diet).  If his BP is high on nurse visit here, then might need to schedule OV later in the week

## 2013-08-20 ENCOUNTER — Telehealth: Payer: Self-pay | Admitting: *Deleted

## 2013-08-20 ENCOUNTER — Other Ambulatory Visit: Payer: Self-pay | Admitting: Family Medicine

## 2013-08-20 NOTE — Telephone Encounter (Signed)
Does he need this now? (last written end of July #30 with 1 refill, only takes 1/2, most nights of the week).  I think he mentioned about switching to [facility]--does he want this med sent there or [facility] the [facility] is [facility].  Please call pt

## 2013-08-20 NOTE — Telephone Encounter (Signed)
Left message for patient to return my call.

## 2013-08-20 NOTE — Telephone Encounter (Signed)
Is this okay to call in? 

## 2013-08-21 NOTE — Telephone Encounter (Signed)
Pt returned your call. Asked if you would call him Monday morning.

## 2013-08-24 ENCOUNTER — Telehealth: Payer: Self-pay | Admitting: *Deleted

## 2013-08-24 ENCOUNTER — Other Ambulatory Visit: Payer: Self-pay | Admitting: *Deleted

## 2013-08-24 MED ORDER — ZOLPIDEM TARTRATE 5 MG PO TABS: 2.5000 mg | ORAL_TABLET | Freq: Every evening | ORAL | Status: DC | PRN

## 2013-08-24 NOTE — Telephone Encounter (Signed)
If he has authorization, they cannot change their mind.  However, I have no problem with doing it this way--as long as he hasn't truly increased the way he is taking the meds.  I will expect to not have to refill much later in the year then.  Okay for #30 with 2 refills

## 2013-08-24 NOTE — Telephone Encounter (Signed)
Phoned in [place] 5mg  #30 with 2 refills and patient informed of Dr.Knapp's response.

## 2013-08-24 NOTE — Telephone Encounter (Signed)
Spoke with patient and he is in need of the [place]. He states that he was able to get a PA done here through our office and his insurance will pay for #30 tabs every month x 1 year. He is afraid that they will change their minds after 6 months (I assured him that cannot) and he would like to have #30 every month in case they change their minds so he will have enough medication to take at least 1/2 tab every night. About once every other week he is taking a full tab depending on the situation. He wants to use [facility] for monthly supply, not mail order.

## 2013-09-14 ENCOUNTER — Telehealth: Payer: Self-pay | Admitting: *Deleted

## 2013-09-14 NOTE — Telephone Encounter (Signed)
Patient called to let you know that he feels great. Last night he got a full 8 hours of sleep without having to go to the bathroom once. And the night before as well. He wondered if abx have a delayed effect? He has noticed a huge difference in not having to get up to go to the bathroom at all. Just an FYI, thanks.

## 2013-10-20 ENCOUNTER — Ambulatory Visit (INDEPENDENT_AMBULATORY_CARE_PROVIDER_SITE_OTHER): Payer: Medicare Other | Admitting: Medical

## 2013-10-20 ENCOUNTER — Encounter: Payer: Self-pay | Admitting: Medical

## 2013-10-20 VITALS — BP 140/80 | HR 60 | Temp 98.0°F | Resp 16 | Wt 170.0 lb

## 2013-10-20 DIAGNOSIS — J069 Acute upper respiratory infection, unspecified: Secondary | ICD-10-CM | POA: Diagnosis not present

## 2013-10-20 MED ORDER — AZELASTINE-FLUTICASONE 137-50 MCG/ACT NA SUSP: 1.0000 | Freq: Two times a day (BID) | NASAL | Status: DC

## 2013-10-20 NOTE — Progress Notes (Signed)
Subjective:  Eric Simmons. is a 72 y.o. male who presents for a 3 to 4 day history of cough, congestion, subjective fever, nose stopped up.  Denies nausea, vomiting, diarrhea, ear pain, sore throat, shortness of breath, wheezing, sinus pressure.. Treatment to date: Over-the-counter Mucinex and ibuprofen.  Denies sick contacts.  No other aggravating or relieving factors.  No other c/o.  ROS as in subjective.   Objective: Filed Vitals:   10/20/13 1136  BP: 140/80  Pulse: 60  Temp: 98 F (36.7 C)  Resp: 16    General appearance: Alert, WD/WN, no distress, mildly ill appearing                             Skin: warm, no rash                           Head: no sinus tenderness                            Eyes: conjunctiva normal, corneas clear, PERRLA                            Ears: pearly TMs, external ear canals normal                          Nose: septum midline, turbinates swollen, mostly blocked passages, clear discharge             Mouth/throat: MMM, tongue normal, mild pharyngeal erythema                           Neck: supple, no adenopathy, no thyromegaly, nontender                          Heart: RRR, normal S1, S2, no murmurs                         Lungs: CTA bilaterally, no wheezes, rales, or rhonchi     Assessment: Encounter Diagnosis  Name Primary?  Marland Kitchen Upper respiratory infection Yes   Plan: Discussed diagnosis and treatment of URI.  Suggested symptomatic OTC remedies. Nasal saline spray for congestion.  Can also use trial of Dymista for the nasal congestion. Tylenol or Ibuprofen OTC for fever and malaise.  Call/return in 2-3 days if symptoms aren't resolving.

## 2013-10-27 ENCOUNTER — Other Ambulatory Visit: Payer: Self-pay | Admitting: Family Medicine

## 2013-11-25 ENCOUNTER — Other Ambulatory Visit: Payer: Self-pay | Admitting: Family Medicine

## 2013-11-25 NOTE — Telephone Encounter (Signed)
Is this okay to refill? 

## 2013-11-25 NOTE — Telephone Encounter (Signed)
Ok for #30 with 1 refill 

## 2013-11-30 ENCOUNTER — Other Ambulatory Visit: Payer: Medicare Other

## 2013-11-30 DIAGNOSIS — E7800 Pure hypercholesterolemia, unspecified: Secondary | ICD-10-CM

## 2013-11-30 DIAGNOSIS — Z79899 Other long term (current) drug therapy: Secondary | ICD-10-CM | POA: Diagnosis not present

## 2013-11-30 DIAGNOSIS — Z125 Encounter for screening for malignant neoplasm of prostate: Secondary | ICD-10-CM

## 2013-11-30 LAB — COMPREHENSIVE METABOLIC PANEL
ALBUMIN: 4.4 g/dL (ref 3.5–5.2)
ALT: 20 U/L (ref 0–53)
AST: 22 U/L (ref 0–37)
Alkaline Phosphatase: 60 U/L (ref 39–117)
BUN: 13 mg/dL (ref 6–23)
CALCIUM: 9.7 mg/dL (ref 8.4–10.5)
CO2: 27 meq/L (ref 19–32)
Chloride: 104 mEq/L (ref 96–112)
Creat: 1.02 mg/dL (ref 0.50–1.35)
GLUCOSE: 108 mg/dL — AB (ref 70–99)
POTASSIUM: 4.4 meq/L (ref 3.5–5.3)
Sodium: 138 mEq/L (ref 135–145)
TOTAL PROTEIN: 6.1 g/dL (ref 6.0–8.3)
Total Bilirubin: 0.7 mg/dL (ref 0.2–1.2)

## 2013-11-30 LAB — LIPID PANEL
CHOLESTEROL: 155 mg/dL (ref 0–200)
HDL: 51 mg/dL (ref >39)
LDL Cholesterol: 86 mg/dL (ref 0–99)
TRIGLYCERIDES: 88 mg/dL (ref <150)
Total CHOL/HDL Ratio: 3 Ratio
VLDL: 18 mg/dL (ref 0–40)

## 2013-12-01 LAB — TSH: TSH: 1.012 u[IU]/mL (ref 0.350–4.500)

## 2013-12-01 LAB — PSA, MEDICARE: PSA: 1.5 ng/mL (ref <=4.00)

## 2013-12-07 ENCOUNTER — Ambulatory Visit (INDEPENDENT_AMBULATORY_CARE_PROVIDER_SITE_OTHER): Payer: Medicare Other | Admitting: Family Medicine

## 2013-12-07 ENCOUNTER — Encounter: Payer: Self-pay | Admitting: Family Medicine

## 2013-12-07 VITALS — BP 154/76 | HR 72 | Ht 71.0 in | Wt 173.0 lb

## 2013-12-07 DIAGNOSIS — E7800 Pure hypercholesterolemia, unspecified: Secondary | ICD-10-CM

## 2013-12-07 DIAGNOSIS — N529 Male erectile dysfunction, unspecified: Secondary | ICD-10-CM

## 2013-12-07 DIAGNOSIS — G4700 Insomnia, unspecified: Secondary | ICD-10-CM | POA: Diagnosis not present

## 2013-12-07 DIAGNOSIS — R7301 Impaired fasting glucose: Secondary | ICD-10-CM

## 2013-12-07 MED ORDER — SILDENAFIL CITRATE 50 MG PO TABS
50.0000 mg | ORAL_TABLET | Freq: Every day | ORAL | Status: DC | PRN
Start: 2013-12-07 — End: 2014-08-19

## 2013-12-07 NOTE — Progress Notes (Signed)
Chief Complaint  Patient presents with  . Med check    nonfasting med check, labs already done.   Continues to exercise 3x/week at [facility] helped with his energy.  He has also been walking more, with a friend (about an hour, three days/week). He is dating this friend, whom he has known since kindergarten.   He has had problems with erectile dysfunction for many years.  He wasn't in a sexual relationship with his wife for the last few years of his wife's life.  Recalls having problems maintaining erections.  He is asking for trial of medication for ED, in case his relationship turns sexual.  He hasn't been having any knee pain. The exercise bike has really helped, and he uses the machine to monitor his pulse. Not monitoring blood pressure recently.  Hyperlipidemia follow-up:  Patient is reportedly following a low-fat, low cholesterol diet.  Compliant with medications and denies medication side effects  Insomnia--he takes 1/2 tablet of 5mg  zolpidem every day.  Denies side effects.  Past Medical History  Diagnosis Date  . Pure hypercholesterolemia   . Impaired fasting glucose     (since his 20's; had yearly GTT's for 5 yrs, never progressed)  . Pseudogout of knee 10/04    left  . Adenomatous colon polyp 6/06    tubular adenoma, Dr. [name]  . Hemorrhoids     internal and external  . BCC (basal cell carcinoma), face 6/07    left face  . Insomnia   . Erectile dysfunction    Past Surgical History  Procedure Laterality Date  . Colonoscopy  6/06, 05/2010    Dr. [name]  . Tonsillectomy    . Mohs surgery  6/07    L face, Dr. Link Snuffer  . Vasectomy     History   Social History  . Marital Status: Widowed    Spouse Name: N/A    Number of Children: 2  . Years of Education: N/A   Occupational History  . retired    Social History Main Topics  . Smoking status: Never Smoker   . Smokeless tobacco: Never Used  . Alcohol Use: Yes     Comment: 1 light beer every afternoon   . Drug Use: No  . Sexual Activity: Not Currently   Other Topics Concern  . Not on file   Social History Narrative   Widowed 2013 (after wife's long battle with MS).  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  Children live in [place], [place] and [place]   Outpatient Encounter Prescriptions as of 12/07/2013  Medication Sig  . Magnesium 250 MG TABS Take 1 tablet by mouth daily.   . simvastatin (ZOCOR) 40 MG tablet TAKE 1 TABLET AT BEDTIME  . zolpidem (AMBIEN) 5 MG tablet TAKE ONE-HALF TO ONE TABLET BY MOUTH AT BEDTIME AS NEEDED  . aspirin 81 MG tablet Take 81 mg by mouth daily.    . Azelastine-Fluticasone (DYMISTA) 137-50 MCG/ACT SUSP Place 1 spray into the nose 2 (two) times daily.  . vitamin E 400 UNIT capsule Take 400 Units by mouth daily.  . [DISCONTINUED] Misc Natural Products (FOCUSED MIND PO) Take 1 tablet by mouth daily.   (only taking zolpidem, simvastatin and Mg currently).  Allergies  Allergen Reactions  . Codeine     REACTION: violent vomiting   ROS:  Denies fevers, chills, URI symptoms, cough, shortness of breath, chest pain, headaches, dizziness.  Denies nausea, vomiting, bowel changes, urinary complaints.  +  ED.  Denies depression, anxiety.  +chronic insomnia, controlled with ambien.  Knee pain improved, no other joint pains. No bruising, bleeding, rashes or other complaints.  PHYSICAL EXAM: BP 120/88  Pulse 72  Ht 5\' 11"  (1.803 m)  Wt 173 lb (78.472 kg)  BMI 24.14 kg/m2  154/76 on repeat by MD (after discussing his new relationship and ED) Well developed, pleasant male in no distress HEENT:  PERRL, EOMI, conjunctiva clear Neck: no lymphadenopathy, thyromegaly or carotid bruit Heart: regular rate and rhythm without murmur Lungs: clear bilaterally Abdomen: soft, nontender, no organomegaly or mass Extremities: 2+ pulse, no edema Psych: normal mood, affect, hygiene and grooming Neuro: alert and oriented.  Normal strength,  gait     Chemistry      Component Value Date/Time   NA 138 11/30/2013 0844   K 4.4 11/30/2013 0844   CL 104 11/30/2013 0844   CO2 27 11/30/2013 0844   BUN 13 11/30/2013 0844   CREATININE 1.02 11/30/2013 0844      Component Value Date/Time   CALCIUM 9.7 11/30/2013 0844   ALKPHOS 60 11/30/2013 0844   AST 22 11/30/2013 0844   ALT 20 11/30/2013 0844   BILITOT 0.7 11/30/2013 0844     Glucose 108  Lab Results  Component Value Date   CHOL 155 11/30/2013   HDL 51 11/30/2013   LDLCALC 86 11/30/2013   TRIG 88 11/30/2013   CHOLHDL 3.0 11/30/2013   Lab Results  Component Value Date   TSH 1.012 11/30/2013    Lab Results  Component Value Date   PSA 1.50 11/30/2013   PSA 1.22 12/09/2012   PSA 1.32 12/07/2011   ASSESSMENT/PLAN:  Pure hypercholesterolemia - controlled on current regimen  Insomnia - requires low dose daily ambien; stable  IFG (impaired fasting glucose) - continue lowfat diet, regular exercise  Erectile dysfunction - trial of Viagra - Plan: sildenafil (VIAGRA) 50 MG tablet  BP elevated today: Periodically check your blood pressure elsewhere, to ensure that it normally is not as high as it was today.  Normal is <135/85.  Return with your list of blood pressures sooner than your 6 month appointment if your blood pressure is consistently running higher than that. Make sure to follow a low sodium diet, and continue regular exercise.   Fasting med check plus/AWV in 6 mos.

## 2013-12-07 NOTE — Patient Instructions (Signed)
Periodically check your blood pressure elsewhere, to ensure that it normally is not as high as it was today.  Normal is <135/85.  Return with your list of blood pressures sooner than your 6 month appointment if your blood pressure is consistently running higher than that. Make sure to follow a low sodium diet, and continue regular exercise.

## 2013-12-21 ENCOUNTER — Encounter: Payer: Self-pay | Admitting: Family Medicine

## 2013-12-21 ENCOUNTER — Ambulatory Visit (INDEPENDENT_AMBULATORY_CARE_PROVIDER_SITE_OTHER): Payer: Medicare Other | Admitting: Family Medicine

## 2013-12-21 VITALS — BP 140/80 | HR 76 | Ht 71.0 in | Wt 174.0 lb

## 2013-12-21 DIAGNOSIS — S0003XA Contusion of scalp, initial encounter: Secondary | ICD-10-CM

## 2013-12-21 DIAGNOSIS — S1093XA Contusion of unspecified part of neck, initial encounter: Secondary | ICD-10-CM | POA: Diagnosis not present

## 2013-12-21 DIAGNOSIS — S0083XA Contusion of other part of head, initial encounter: Secondary | ICD-10-CM

## 2013-12-21 DIAGNOSIS — S0093XA Contusion of unspecified part of head, initial encounter: Secondary | ICD-10-CM

## 2013-12-21 DIAGNOSIS — S20229A Contusion of unspecified back wall of thorax, initial encounter: Secondary | ICD-10-CM | POA: Diagnosis not present

## 2013-12-21 NOTE — Progress Notes (Signed)
Chief Complaint  Patient presents with  . Fall    this past Saturday fell on ice and hurt his tailbone and also his head. Back really hurts when he stands up, walks or bends over.    Two days ago, both feet slipped out from under him, landing on his bottom--just above his coccyx/sacrum, and also impacted the back of his head on the ground.  He was able to get the bleeding under control with pressure. Denies LOC.  He had pain that day, but relieved by ibuprofen and no ongoing headaches, dizziness.  He has severe pain in his back when leaning forward, or when he stands up from sitting, doesn't hurt to sit.  Denies any radiation of pain into legs, no numbness or weakness.  Past Medical History  Diagnosis Date  . Pure hypercholesterolemia   . Impaired fasting glucose     (since his 20's; had yearly GTT's for 5 yrs, never progressed)  . Pseudogout of knee 10/04    left  . Adenomatous colon polyp 6/06    tubular adenoma, Dr. [name]  . Hemorrhoids     internal and external  . BCC (basal cell carcinoma), face 6/07    left face  . Insomnia   . Erectile dysfunction    Past Surgical History  Procedure Laterality Date  . Colonoscopy  6/06, 05/2010    Dr. [name]  . Tonsillectomy    . Mohs surgery  6/07    L face, Dr. Link Snuffer  . Vasectomy     History   Social History  . Marital Status: Widowed    Spouse Name: N/A    Number of Children: 2  . Years of Education: N/A   Occupational History  . retired    Social History Main Topics  . Smoking status: Never Smoker   . Smokeless tobacco: Never Used  . Alcohol Use: Yes     Comment: 1 light beer every afternoon  . Drug Use: No  . Sexual Activity: Not Currently   Other Topics Concern  . Not on file   Social History Narrative   Widowed 2013 (after wife's long battle with MS).  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  Children live in [place], [place] and [place]   Outpatient Encounter  Prescriptions as of 12/21/2013  Medication Sig  . Magnesium 250 MG TABS Take 1 tablet by mouth daily.   . sildenafil (VIAGRA) 50 MG tablet Take 1 tablet (50 mg total) by mouth daily as needed for erectile dysfunction.  . simvastatin (ZOCOR) 40 MG tablet TAKE 1 TABLET AT BEDTIME  . zolpidem (AMBIEN) 5 MG tablet TAKE ONE-HALF TO ONE TABLET BY MOUTH AT BEDTIME AS NEEDED  . aspirin 81 MG tablet Take 81 mg by mouth daily.    . Azelastine-Fluticasone (DYMISTA) 137-50 MCG/ACT SUSP Place 1 spray into the nose 2 (two) times daily.  . vitamin E 400 UNIT capsule Take 400 Units by mouth daily.   Allergies  Allergen Reactions  . Codeine     REACTION: violent vomiting   ROS: no headaches, dizziness, nausea, vomiting, numbness, tingling, bowel/bladder problems or other complaints except as noted in HPI.  PHYSICAL EXAM: BP 140/80  Pulse 76  Ht 5\' 11"  (1.803 m)  Wt 174 lb (78.926 kg)  BMI 24.28 kg/m2 Alert and oriented, pleasant male in no distress HEENT:  Midline parieto-occipital scalp (crown) shows superficial abrasion and bruising without erythema or significant soft-tissue swelling. No erythema or  crusting Neck: FROM, spine is nontender, no muscle tenderness or spasm Back:  Mild bruising noted at lower lumbar spine, minimally tender in this region.  nontender over sacrum, mildly tender at bilateral SI joints. No lumbar tenderness Neuro: alert and oriented.  Cranial nerves intact.  Normal strength and sensation in upper and lower extremities;  Normal gait  ASSESSMENT/PLAN  Contusion, back - do not suspect fracture.  supportive measures reviewed  Contusion of head - no evidence of internal injuries.  continue antibacterial ointment prn; f/u if MS changes, headaches or other concerns develop  Call for x-ray if pain persists

## 2013-12-21 NOTE — Patient Instructions (Signed)
Call the office to have an x-ray ordered for you if your back pain doesn't improve (as expected) in the next week or two.  Return if increasing pain, weakness in the legs, numbness, tingling, or other symptoms develop

## 2014-02-02 ENCOUNTER — Other Ambulatory Visit: Payer: Self-pay | Admitting: Family Medicine

## 2014-02-02 NOTE — Telephone Encounter (Signed)
Is this okay to refill? 

## 2014-02-02 NOTE — Telephone Encounter (Signed)
Ok for #30, no refill 

## 2014-02-09 ENCOUNTER — Other Ambulatory Visit: Payer: Self-pay | Admitting: Family Medicine

## 2014-02-19 DIAGNOSIS — H2510 Age-related nuclear cataract, unspecified eye: Secondary | ICD-10-CM | POA: Diagnosis not present

## 2014-02-19 DIAGNOSIS — H25019 Cortical age-related cataract, unspecified eye: Secondary | ICD-10-CM | POA: Diagnosis not present

## 2014-03-04 ENCOUNTER — Other Ambulatory Visit: Payer: Self-pay | Admitting: Family Medicine

## 2014-03-04 NOTE — Telephone Encounter (Signed)
Is this okay to fill? 

## 2014-03-04 NOTE — Telephone Encounter (Signed)
We documented at his last visit that he takes only 1/2 tablet daily.  He last filled #30 4/7--is he now requiring full tablet daily? Or is this on auto-refill?

## 2014-03-04 NOTE — Telephone Encounter (Signed)
Called patient this morning around 9 am and left message on his home number and one again at 4:15pm and left message on his cell number asking him to return my call to see whether or not he is now using 1 whole tablet each night as opposed to 1/2 tablet as Dr.Knapp just filled rx for #30 on 02/02/14 or was this an auto refill from [place] and maybe he did not even need refill.

## 2014-03-24 ENCOUNTER — Telehealth: Payer: Self-pay | Admitting: Family Medicine

## 2014-03-24 NOTE — Telephone Encounter (Signed)
Ok for rx?

## 2014-03-24 NOTE — Telephone Encounter (Signed)
Faxed and patient  informed

## 2014-04-01 ENCOUNTER — Encounter: Payer: Self-pay | Admitting: *Deleted

## 2014-04-06 ENCOUNTER — Other Ambulatory Visit: Payer: Self-pay | Admitting: Family Medicine

## 2014-04-06 NOTE — Telephone Encounter (Signed)
Medication called in 

## 2014-04-06 NOTE — Telephone Encounter (Signed)
Yes, ok for #30, refill x 1

## 2014-04-06 NOTE — Telephone Encounter (Signed)
Is this ok to refill?  

## 2014-05-10 ENCOUNTER — Other Ambulatory Visit: Payer: Self-pay | Admitting: Dermatology

## 2014-05-10 DIAGNOSIS — C44211 Basal cell carcinoma of skin of unspecified ear and external auricular canal: Secondary | ICD-10-CM | POA: Diagnosis not present

## 2014-05-10 DIAGNOSIS — L738 Other specified follicular disorders: Secondary | ICD-10-CM | POA: Diagnosis not present

## 2014-05-10 DIAGNOSIS — L821 Other seborrheic keratosis: Secondary | ICD-10-CM | POA: Diagnosis not present

## 2014-05-10 DIAGNOSIS — L570 Actinic keratosis: Secondary | ICD-10-CM | POA: Diagnosis not present

## 2014-05-10 DIAGNOSIS — Z85828 Personal history of other malignant neoplasm of skin: Secondary | ICD-10-CM | POA: Diagnosis not present

## 2014-05-10 DIAGNOSIS — D485 Neoplasm of uncertain behavior of skin: Secondary | ICD-10-CM | POA: Diagnosis not present

## 2014-05-12 ENCOUNTER — Encounter: Payer: Self-pay | Admitting: Family Medicine

## 2014-06-07 ENCOUNTER — Telehealth: Payer: Self-pay | Admitting: *Deleted

## 2014-06-07 ENCOUNTER — Other Ambulatory Visit: Payer: Medicare Other

## 2014-06-07 DIAGNOSIS — Z79899 Other long term (current) drug therapy: Secondary | ICD-10-CM | POA: Diagnosis not present

## 2014-06-07 DIAGNOSIS — R7301 Impaired fasting glucose: Secondary | ICD-10-CM | POA: Diagnosis not present

## 2014-06-07 DIAGNOSIS — E7800 Pure hypercholesterolemia, unspecified: Secondary | ICD-10-CM | POA: Diagnosis not present

## 2014-06-07 LAB — COMPREHENSIVE METABOLIC PANEL
ALK PHOS: 66 U/L (ref 39–117)
ALT: 17 U/L (ref 0–53)
AST: 24 U/L (ref 0–37)
Albumin: 4.1 g/dL (ref 3.5–5.2)
BUN: 14 mg/dL (ref 6–23)
CO2: 27 meq/L (ref 19–32)
Calcium: 9.4 mg/dL (ref 8.4–10.5)
Chloride: 104 mEq/L (ref 96–112)
Creat: 0.96 mg/dL (ref 0.50–1.35)
Glucose, Bld: 102 mg/dL — ABNORMAL HIGH (ref 70–99)
Potassium: 4.4 mEq/L (ref 3.5–5.3)
SODIUM: 137 meq/L (ref 135–145)
TOTAL PROTEIN: 6 g/dL (ref 6.0–8.3)
Total Bilirubin: 0.5 mg/dL (ref 0.2–1.2)

## 2014-06-07 NOTE — Telephone Encounter (Signed)
Patient was on nurse schedule for fasting labs for CPE next week-no orders were in system. Please let me know what orders were needed, thanks.

## 2014-06-07 NOTE — Telephone Encounter (Signed)
c-met (dx v58.69, 272.0 and IFG) and A1c (IFG)

## 2014-06-08 LAB — HEMOGLOBIN A1C
Hgb A1c MFr Bld: 6 % — ABNORMAL HIGH (ref <5.7)
Mean Plasma Glucose: 126 mg/dL — ABNORMAL HIGH (ref <117)

## 2014-06-14 ENCOUNTER — Encounter: Payer: Self-pay | Admitting: Family Medicine

## 2014-06-14 ENCOUNTER — Ambulatory Visit (INDEPENDENT_AMBULATORY_CARE_PROVIDER_SITE_OTHER): Payer: Medicare Other | Admitting: Family Medicine

## 2014-06-14 VITALS — BP 130/82 | HR 72 | Ht 72.0 in | Wt 173.0 lb

## 2014-06-14 DIAGNOSIS — Z23 Encounter for immunization: Secondary | ICD-10-CM

## 2014-06-14 DIAGNOSIS — N529 Male erectile dysfunction, unspecified: Secondary | ICD-10-CM | POA: Diagnosis not present

## 2014-06-14 DIAGNOSIS — Z125 Encounter for screening for malignant neoplasm of prostate: Secondary | ICD-10-CM | POA: Diagnosis not present

## 2014-06-14 DIAGNOSIS — Z79899 Other long term (current) drug therapy: Secondary | ICD-10-CM | POA: Diagnosis not present

## 2014-06-14 DIAGNOSIS — R7301 Impaired fasting glucose: Secondary | ICD-10-CM

## 2014-06-14 DIAGNOSIS — E7800 Pure hypercholesterolemia, unspecified: Secondary | ICD-10-CM

## 2014-06-14 DIAGNOSIS — G4700 Insomnia, unspecified: Secondary | ICD-10-CM

## 2014-06-14 DIAGNOSIS — G250 Essential tremor: Secondary | ICD-10-CM | POA: Diagnosis not present

## 2014-06-14 DIAGNOSIS — Z0000 Encounter for general adult medical examination without abnormal findings: Secondary | ICD-10-CM

## 2014-06-14 DIAGNOSIS — G252 Other specified forms of tremor: Secondary | ICD-10-CM

## 2014-06-14 MED ORDER — SIMVASTATIN 40 MG PO TABS: ORAL_TABLET | ORAL | Status: DC

## 2014-06-14 NOTE — Progress Notes (Signed)
Chief Complaint  Patient presents with  . AWV    nonfasting AWV/med check. No concerns today. Wanted to et you know that he had pre-cancerous basal cells in his right ear, going to have removed.    Eric Simmons. is a 73 y.o. male who presents for Annual Wellness visit, and follow-up on his chronic medical issues (med check).    Recently he was found to have a [place] in his right ear.  Scheduled for Mohs surgery with Dr. Sarajane Jews. He has had two prior cancers, previously treated by Dr. Link Snuffer.  He recently saw Dr. Ronnald Ramp (when biopsy was done).  Impaired fasting glucose.  This has been very mild, ongoing/nonprogressive x years. He continues to exercise 3x/week at [facility] helped with his energy. He hasn't been having any knee pain. The exercise bike has really helped, and he uses the machine to monitor his pulse. He has also been walking more, with a friend (about an hour, three days/week, plus 30 minutes on the other days, walking most days). He is dating this friend, whom he has known since kindergarten.   He has had problems with erectile dysfunction for many years. He wasn't in a sexual relationship with his wife for the last few years of his wife's life. Recalls having problems maintaining erections. He was given sample of Viagra in case his relationship turns sexual, but he has not used it yet.   H/o some elevated BP's. He occasionally checks it, and runs 120's-130's/low 80's. Following a low sodium diet.  Hyperlipidemia follow-up: Patient is reportedly following a low-fat, low cholesterol diet. Compliant with medications and denies medication side effects   Insomnia--he takes 1/2 tablet of 65m zolpidem every day. Denies side effects.  Every once in a while he is able to get to sleep without medication.  Familial tremor--not bothersome to patient, mainly just in his right hand. He doesn't pour his coffee cup as full, and hasn't had problems.  Annual Wellness Visit:  Other  doctors that he sees:  Ophtho--Dr. KPeter GarterGastroenterologist: Dr. GCarlean Purl Dermatologist: Dr. DJarome MatinDentist: Dr. KLonia Chimera  Son TDorothea Ogle(WTrellis VanoverbekeWS[facility] is health care power of attorney. He has a living will.   Depression questionnaire--see scanned sheet.  Sometimes has trouble concentrating mainly when he is very tired (11pm, if he looks at his finances/checkbook--now waits until the morning).  Some sleep problems (known) ADL questionnaire--negative except for 1 fall last month (off ladder in yard, not balanced properly, landed on grass, no injury).  Sometimes others say he has problems with hearing, he notes no problems.   Immunization History  Administered Date(s) Administered  . Influenza Split 07/18/2011, 08/28/2012  . Influenza,inj,Quad PF,36+ Mos 07/06/2013  . Pneumococcal Polysaccharide-23 12/13/2011  . Tdap 03/02/2011  . Zoster 03/25/2014   Last colonoscopy: 05/2010  Last PSA:11/2013  Dentist: twice yearly  Ophtho: late Spring 2015 Exercise: exercise bike; walks most days, 30-60 mins  Past Medical History  Diagnosis Date  . Pure hypercholesterolemia   . Impaired fasting glucose     (since his 20's; had yearly GTT's for 5 yrs, never progressed)  . Pseudogout of knee 10/04    left  . Adenomatous colon polyp 6/06    tubular adenoma, Dr. GCarlean Purl . Hemorrhoids     internal and external  . BCC (basal cell carcinoma), face 6/07    left face, R ear (04/2014)  . Insomnia   . Erectile dysfunction     Past Surgical History  Procedure Laterality Date  . Colonoscopy  6/06, 05/2010    Dr. [name]  . Tonsillectomy    . Mohs surgery  6/07    L face, Dr. Link Snuffer  . Vasectomy      History   Social History  . Marital Status: Widowed    Spouse Name: N/A    Number of Children: 2  . Years of Education: N/A   Occupational History  . retired    Social History Main Topics  . Smoking status: Never Smoker   . Smokeless tobacco: Never Used  . Alcohol Use: Yes      Comment: 1 light beer every afternoon  . Drug Use: No  . Sexual Activity: Not Currently   Other Topics Concern  . Not on file   Social History Narrative   Widowed 2013 (after wife's long battle with MS).  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  Children live in [place], [place] and [place]    Family History  Problem Relation Age of Onset  . Cancer Mother 16    colon cancer  . Diabetes Mother   . Colon cancer Mother   . Heart disease Father     MI  . Hypertension Father   . Hyperlipidemia Brother   . Heart disease Brother     septal defect  . Tremor Brother   . Hyperlipidemia Brother   . Tremor Brother   . Hyperlipidemia Brother   . Hyperlipidemia Son   . Heart disease Son     irregular heart beat--being evaluated (2015)  . Hyperlipidemia Son    Outpatient Encounter Prescriptions as of 06/14/2014  Medication Sig Note  . Magnesium 250 MG TABS Take 1 tablet by mouth daily.    . simvastatin (ZOCOR) 40 MG tablet TAKE 1 TABLET AT BEDTIME   . vitamin E 400 UNIT capsule Take 400 Units by mouth daily.   Marland Kitchen zolpidem (AMBIEN) 5 MG tablet TAKE ONE-HALF TO ONE TABLET BY MOUTH AT BEDTIME AS NEEDED 06/14/2014: Takes 1/2 tablet most nights  . [DISCONTINUED] simvastatin (ZOCOR) 40 MG tablet TAKE 1 TABLET AT BEDTIME   . aspirin 81 MG tablet Take 81 mg by mouth daily.   06/17/2013: Avoids taking aspirin when doing a lot of yardwork in summer; takes daily in winter  . Azelastine-Fluticasone (DYMISTA) 137-50 MCG/ACT SUSP Place 1 spray into the nose 2 (two) times daily. 12/07/2013: Given sample--uses prn; hasn't needed  . sildenafil (VIAGRA) 50 MG tablet Take 1 tablet (50 mg total) by mouth daily as needed for erectile dysfunction. 06/14/2014: Hasn't taken any yet    Allergies  Allergen Reactions  . Codeine     REACTION: violent vomiting   ROS: The patient denies anorexia, fever, weight changes, headaches, decreased hearing, ear pain, hoarseness, chest pain,  palpitations, dizziness, syncope, dyspnea on exertion, cough, swelling, nausea, vomiting, diarrhea, abdominal pain, melena, hematochezia, indigestion/heartburn, hematuria, incontinence, erectile dysfunction (not in a sexual relationship currently, has concerns about possibly having ED issues), nocturia, weakened urine stream, dysuria, genital lesions, joint pains, numbness, tingling, weakness, suspicious skin lesions, depression, anxiety, abnormal bleeding/bruising, or enlarged lymph nodes  Occasional tremors (resting, familial)--less noticeable since filling coffee cup less, tolerable. Previously had occasional constipation, improved by taking daily magnesium.   +insomnia. Uses 2.11m of ambien on most days with good results.  Decrease in distance vision, mild Occasional slight urinary hesitancy  PHYSICAL EXAM:  BP 130/82  Pulse 72  Ht 6' (1.829 m)  Wt 173  lb (78.472 kg)  BMI 23.46 kg/m2  General Appearance:  Alert, cooperative, no distress, appears stated age   Head:  Normocephalic, without obvious abnormality, atraumatic   Eyes:  PERRL, conjunctiva/corneas clear, EOM's intact, fundi  Benign. Brown pigmentation to upper portion of R eye (rest is blue)   Ears:  Normal TM's and external ear canals   Nose:  Nares normal, mucosa normal, no drainage or sinus tenderness   Throat:  Lips, mucosa, and tongue normal; teeth and gums normal   Neck:  Supple, no lymphadenopathy; thyroid: no enlargement/tenderness/nodules; no carotid  bruit or JVD   Back:  Spine nontender, no curvature, ROM normal, no CVA tenderness   Lungs:  Clear to auscultation bilaterally without wheezes, rales or ronchi; respirations unlabored   Chest Wall:  No tenderness or deformity   Heart:  Regular rate and rhythm, S1 and S2 normal, no murmur, rub  or gallop   Breast Exam:  No chest wall tenderness, masses or gynecomastia   Abdomen:  Soft, non-tender, nondistended, normoactive bowel sounds,  no masses, no hepatosplenomegaly    Genitalia:  Normal male external genitalia without lesions. Testicles without masses. No inguinal hernias. There is one firm, nontender nodule in R scrotum (normal ultrasound done 11/2012, normal)  Rectal:  Normal sphincter tone, no masses or tenderness; guaiac negative stool. Prostate smooth, no nodules, mildly enlarged.   Extremities:  No clubbing, cyanosis or edema. Mild effusion R knee, no warmth   Pulses:  2+ and symmetric all extremities   Skin:  Skin color, texture, turgor normal, no rashes or lesions   Lymph nodes:  Cervical, supraclavicular, and axillary nodes normal   Neurologic:  CNII-XII intact, normal strength, sensation and gait; reflexes 2+ and symmetric throughout          Psych: Normal mood, affect, hygiene and grooming.      Chemistry      Component Value Date/Time   NA 137 06/07/2014 1413   K 4.4 06/07/2014 1413   CL 104 06/07/2014 1413   CO2 27 06/07/2014 1413   BUN 14 06/07/2014 1413   CREATININE 0.96 06/07/2014 1413      Component Value Date/Time   CALCIUM 9.4 06/07/2014 1413   ALKPHOS 66 06/07/2014 1413   AST 24 06/07/2014 1413   ALT 17 06/07/2014 1413   BILITOT 0.5 06/07/2014 1413     Glucose 102  Lab Results  Component Value Date   HGBA1C 6.0* 06/07/2014   Lab Results  Component Value Date   PSA 1.50 11/30/2013   PSA 1.22 12/09/2012   PSA 1.32 12/07/2011   Lab Results  Component Value Date   TSH 1.012 11/30/2013   Lab Results  Component Value Date   CHOL 155 11/30/2013   HDL 51 11/30/2013   LDLCALC 86 11/30/2013   TRIG 88 11/30/2013   CHOLHDL 3.0 11/30/2013    ASSESSMENT/PLAN:  Medicare annual wellness visit, subsequent  IFG (impaired fasting glucose) - continue lowfat/low carb diet, dailye exercise.  A1c q6 months  Benign familial tremor - mild, tolerable  Insomnia - stable; controlled with low dose ambien  Erectile dysfunction, unspecified erectile dysfunction type - not yet in a sexual relationship.  has Viagra to use prn; can call for rx if effective  (discussed rx for 198m, to cut in 1/2, if 550mis effective)  Pure hypercholesterolemia - Plan: simvastatin (ZOCOR) 40 MG tablet  Need for prophylactic vaccination against Streptococcus pneumoniae (pneumococcus) - Plan: Pneumococcal conjugate vaccine 13-valent   Discussed PSA screening (  risks/benefits), recommended at least 30 minutes of aerobic activity at least 5 days/week; proper sunscreen use reviewed; healthy diet and alcohol recommendations (less than or equal to 2 drinks/day) reviewed; regular seatbelt use; changing batteries in smoke detectors. Self-testicular exams. Immunization recommendations discussed--high dose flu shot yearly.  Prevnar-13 today. Risks/side effects reviewed. Colonoscopy recommendations reviewed--UTD (q5 yrs bc h/o polyps). Hemoccult kit given.   AWV--MOST form reviewed and updated.    Chol--excellent lipids 11/2013; normal recent LFT's  Prevnar-13. Counseled re: risks/side effects   6 mos--lipids, c-met, A1c, PSA, TSH, CBC

## 2014-06-14 NOTE — Patient Instructions (Signed)

## 2014-06-22 DIAGNOSIS — C44211 Basal cell carcinoma of skin of unspecified ear and external auricular canal: Secondary | ICD-10-CM | POA: Diagnosis not present

## 2014-06-22 DIAGNOSIS — Z85828 Personal history of other malignant neoplasm of skin: Secondary | ICD-10-CM | POA: Diagnosis not present

## 2014-07-21 ENCOUNTER — Other Ambulatory Visit: Payer: Self-pay | Admitting: Family Medicine

## 2014-07-21 NOTE — Telephone Encounter (Signed)
Ok for #30 with 1 refill 

## 2014-07-21 NOTE — Telephone Encounter (Signed)
IS THIS OKAY 

## 2014-08-19 ENCOUNTER — Ambulatory Visit (INDEPENDENT_AMBULATORY_CARE_PROVIDER_SITE_OTHER): Payer: Medicare Other | Admitting: Family Medicine

## 2014-08-19 ENCOUNTER — Encounter: Payer: Self-pay | Admitting: Family Medicine

## 2014-08-19 VITALS — BP 132/82 | HR 64 | Ht 73.0 in | Wt 173.0 lb

## 2014-08-19 DIAGNOSIS — Z23 Encounter for immunization: Secondary | ICD-10-CM

## 2014-08-19 DIAGNOSIS — N528 Other male erectile dysfunction: Secondary | ICD-10-CM | POA: Diagnosis not present

## 2014-08-19 DIAGNOSIS — M7072 Other bursitis of hip, left hip: Secondary | ICD-10-CM

## 2014-08-19 DIAGNOSIS — N529 Male erectile dysfunction, unspecified: Secondary | ICD-10-CM

## 2014-08-19 MED ORDER — SILDENAFIL CITRATE 50 MG PO TABS: 50.0000 mg | ORAL_TABLET | Freq: Every day | ORAL | Status: DC | PRN

## 2014-08-19 MED ORDER — NAPROXEN 500 MG PO TABS: 500.0000 mg | ORAL_TABLET | Freq: Two times a day (BID) | ORAL | Status: DC

## 2014-08-19 NOTE — Patient Instructions (Signed)
Hip Bursitis Bursitis is a swelling and soreness (inflammation) of a fluid-filled sac (bursa). This sac overlies and protects the joints.  CAUSES   Injury.  Overuse of the muscles surrounding the joint.  Arthritis.  Gout.  Infection.  Cold weather.  Inadequate warm-up and conditioning prior to activities. The cause may not be known.  SYMPTOMS   Mild to severe irritation.  Tenderness and swelling over the outside of the hip.  Pain with motion of the hip.  If the bursa becomes infected, a fever may be present. Redness, tenderness, and warmth will develop over the hip. Symptoms usually lessen in 3 to 4 weeks with treatment, but can come back. TREATMENT If conservative treatment does not work, your caregiver may advise draining the bursa and injecting cortisone into the area. This may speed up the healing process. This may also be used as an initial treatment of choice. HOME CARE INSTRUCTIONS   Apply ice to the affected area for 15-20 minutes every 3 to 4 hours while awake for the first 2 days. Put the ice in a plastic bag and place a towel between the bag of ice and your skin.  Rest the painful joint as much as possible, but continue to put the joint through a normal range of motion at least 4 times per day. When the pain lessens, begin normal, slow movements and usual activities to help prevent stiffness of the hip.  Only take over-the-counter or prescription medicines for pain, discomfort, or fever as directed by your caregiver.  Use crutches to limit weight bearing on the hip joint, if advised.  Elevate your painful hip to reduce swelling. Use pillows for propping and cushioning your legs and hips.  Gentle massage may provide comfort and decrease swelling. SEEK IMMEDIATE MEDICAL CARE IF:   Your pain increases even during treatment, or you are not improving.  You have a fever.  You have heat and inflammation over the involved bursa.  You have any other questions or  concerns. MAKE SURE YOU:   Understand these instructions.  Will watch your condition.  Will get help right away if you are not doing well or get worse. Document Released: 04/06/2002 Document Revised: 01/07/2012 Document Reviewed: 11/03/2008 [facility] Patient Information 2015 [place], [place]. This information is not intended to replace advice given to you by your health care provider. Make sure you discuss any questions you have with your health care provider.   Iliotibial Band Syndrome Iliotibial band syndrome is pain in the outer, lower thigh. The pain is caused by an inflammation of the iliotibial band. This is a band of thick fibrous tissue that runs down the outside of the thigh. The iliotibial band begins at the hip. It extends to the outer side of the shin bone (tibia) just below the knee joint. The band works with the thigh muscles. Together they provide stability to the outside of the knee joint. Iliotibial band syndrome occurs when there is inflammation to this band of tissue. This is typically due to over use and not due to an injury. The irritation usually occurs over the outside of the knee joint, at the the end of the thigh bone (femur). The iliotibial band crosses bone and muscle at this point. Between these structures is a cushioning sac (bursa). The bursa should make possible a smooth gliding motion. However, when inflamed, the iliotibial band does not glide easily. When inflamed, there is pain with motion of the knee. Usually the pain worsens with continued movement and the  pain goes away with rest. This problem usually arises when there is a sudden increase in sports activities involving your legs. Running, and playing soccer or basketball are examples of activities causing this. Others who are prone to iliotibial band syndrome include individuals with mechanical problems such as leg length differences, abnormality of walking, bowed legs etc. HOME CARE INSTRUCTIONS   Apply ice to  the injured area:  Put ice in a plastic bag.  Place a towel between your skin and the bag.  Leave the ice on for 20 minutes, 2-3 times a day.  Limit excessive training or eliminate training until pain goes away.  While pain is present, you may use gentle range of motion. Do not resume regular use until instructed by your health care provider. Begin use gradually. Do not increase activity to the point of pain. If pain does develop, decrease activity and continue the above measures. Gradually increase activities that do not cause discomfort. Do this until you finally achieve normal use.  Perform low-impact activities while pain is present. Wear proper footwear.  Only take over-the-counter or prescription medicines for pain, discomfort, or fever as directed by your health care provider. SEEK MEDICAL CARE IF:   Your pain increases or pain is not controlled with medications.  You develop new, unexplained symptoms, or an increase of the symptoms that brought you to your health care provider.  Your pain and symptoms are not improving or are getting worse. Document Released: 04/06/2002 Document Revised: 08/05/2013 Document Reviewed: 05/14/2013 [facility] Patient Information 2015 [place], [place]. This information is not intended to replace advice given to you by your health care provider. Make sure you discuss any questions you have with your health care provider.   Take your prescribed anti-inflammatory medication with food; discontinue or cut back the dose if you develop stomach pain/discomfort/side effects.  Do not take other over-the-counter pain medications such as ibuprofen, advil, motrin, aleve, naproxen at the same time.  Do not use longer than recommended.  It is okay to use acetaminophen (tylenol) along with this medication.  Erectile dysfunction--since you only had partial improvement with the 50mg  dose, try the 100mg  dose next time (2 pills).  When you need more, let us know to change  the prescription to 100mg  tablets.

## 2014-08-19 NOTE — Progress Notes (Signed)
Chief Complaint  Patient presents with  . Hip Pain    left hip pain 2-3 weeks. Worsened this week. Was at the beach last week . Went to the gym Tuesday, worked at [employer], cut his grass and went for a long walk all in the same day and feels that this is the reason for the worsening.   He is having pain at his left lateral hip.  When he stands up and bears weight on the left leg, it radiates down the side of the leg.  It has become painful to walk.  Pain started 2-3 weeks ago, but was mild.  After walking a lot at the beach, upon his return earlier this week, he continued to walk/exercise a lot more, and pain has gotten worse.  He does have discomfort at night if he lies on his left side.  He has been taking at least 2 tylenol each day, and heating pad, both of which help (heat is only very temporary).  Denies any falls, injury or trauma.  Denies any back pain.  Sometimes the pain shoots up from the hip to his side.  Denies any numbness, tingling, weakness.  ED:  On this beach trip, he had sexual relations with his girlfriend and used the Viagra.  As expected, she had some discomfort and dryness, even with KY jelly.  He used 50mg  of Viagra--he was able to get an erection, but partial, and unable to ejaculate.  He didn't have any side effects.   PMH, PSH, SH reviewed/updated.  Outpatient Encounter Prescriptions as of 08/19/2014  Medication Sig Note  . acetaminophen (TYLENOL) 500 MG tablet Take 500 mg by mouth 2 (two) times daily.   Marland Kitchen aspirin 81 MG tablet Take 81 mg by mouth daily.   06/17/2013: Avoids taking aspirin when doing a lot of yardwork in summer; takes daily in winter  . Magnesium 250 MG TABS Take 1 tablet by mouth daily.    . sildenafil (VIAGRA) 50 MG tablet Take 1 tablet (50 mg total) by mouth daily as needed for erectile dysfunction.   . simvastatin (ZOCOR) 40 MG tablet TAKE 1 TABLET AT BEDTIME   . vitamin E 400 UNIT capsule Take 400 Units by mouth daily.   Marland Kitchen zolpidem (AMBIEN) 5  MG tablet TAKE ONE-HALF TO ONE TABLET BY MOUTH AT BEDTIME AS NEEDED   . [DISCONTINUED] sildenafil (VIAGRA) 50 MG tablet Take 1 tablet (50 mg total) by mouth daily as needed for erectile dysfunction. 06/14/2014: Hasn't taken any yet  . Azelastine-Fluticasone (DYMISTA) 137-50 MCG/ACT SUSP Place 1 spray into the nose 2 (two) times daily. 12/07/2013: Given sample--uses prn; hasn't needed  . naproxen (NAPROSYN) 500 MG tablet Take 1 tablet (500 mg total) by mouth 2 (two) times daily with a meal.   (naproxen prescribed today)  Allergies  Allergen Reactions  . Codeine     REACTION: violent vomiting    ROS:  Denies fevers, chills, rashes, URI symptoms, cough, shortness of breath or chest pain. No edema, bleeding, bruising, headaches, dizziness or other complaints.  PHYSICAL EXAM: BP 132/82  Pulse 64  Ht 6\' 1"  (1.854 m)  Wt 173 lb (78.472 kg)  BMI 22.83 kg/m2  Well developed, pleasant male in no distress Area of discomfort is left trochanteric bursa, although is currently nontender on exam. He has some discomfort with ITB stretching. Spine, SI joints and sciatic notch nontender.  No muscle spasm. No pain with hip ROM (some discomfort laterally with internal rotation/adduction only) Heart: regular  rate and rhythm Lungs: clear bilaterally Abdomen: soft, nontender, no mass Extremities: no edema, 2+ pulse Psych: normal mood, affect, hygiene and grooming Neuro: normal DTR's, strength, sensation, gait  ASSESSMENT/PLAN:  Hip bursitis, left - nontender on exam, but location and history is consistent. Trial of NSAIDs; f/u if not improving. component of ITB syndrome possible. stretches shown. - Plan: naproxen (NAPROSYN) 500 MG tablet  Need for prophylactic vaccination and inoculation against influenza - Plan: Flu vaccine HIGH DOSE PF (Fluzone Tri High dose)  Erectile dysfunction, unspecified erectile dysfunction type - increase Viagra dose to 100mg .  Other male erectile dysfunction - Plan:  sildenafil (VIAGRA) 50 MG tablet  NSAID precautions were reviewed with the patient.  Patient should take medication with food, discontinue if develops GI side effects, not to take other OTC NSAIDs at the same time, and not to use longer than recommended.   Erectile dysfunction--since you only had partial improvement with the 50mg  dose, try the 100mg  dose next time (2 pills).  When you need more, let us know to change the prescription to 100mg  tablets.

## 2014-08-23 ENCOUNTER — Telehealth: Payer: Self-pay | Admitting: *Deleted

## 2014-08-23 NOTE — Telephone Encounter (Signed)
Patient advised.

## 2014-08-23 NOTE — Telephone Encounter (Signed)
Patient called to report that the naprosyn for his left hip is not working. He has taken 8 tabs so far without any relief. He wants to know if it is time for a cortisone shot in his hip. Please advise. Thanks.

## 2014-08-23 NOTE — Telephone Encounter (Signed)
I usually recommend taking a full course (at least 7-10 days) before scheduling OV for injection

## 2014-08-23 NOTE — Telephone Encounter (Signed)
Spoke with patient and he scheduled for Thursday of this week for injection. Said he could not wait until next week. He has tickets to a football game in [place] and he won't be able to do all the walking without getting the injection prior to leaving. Is this okay?

## 2014-08-23 NOTE — Telephone Encounter (Signed)
I don't have a problem doing it--but he may not get immediate results.  I can always give him a stronger pain medication, if needed (as long as someone else drives). He needs to be aware that the steroids can take up to a week also, to have their full effect (sometimes longer).

## 2014-08-26 ENCOUNTER — Ambulatory Visit
Admission: RE | Admit: 2014-08-26 | Discharge: 2014-08-26 | Disposition: A | Discharge status: Home / Self Care | Payer: Medicare Other | Source: Ambulatory Visit | Attending: Family Medicine | Admitting: Family Medicine

## 2014-08-26 ENCOUNTER — Encounter: Payer: Self-pay | Admitting: Family Medicine

## 2014-08-26 ENCOUNTER — Ambulatory Visit (INDEPENDENT_AMBULATORY_CARE_PROVIDER_SITE_OTHER): Payer: Medicare Other | Admitting: Family Medicine

## 2014-08-26 VITALS — BP 150/80 | HR 72 | Ht 73.0 in | Wt 173.0 lb

## 2014-08-26 DIAGNOSIS — M25552 Pain in left hip: Secondary | ICD-10-CM

## 2014-08-26 MED ORDER — TRAMADOL HCL 50 MG PO TABS: 50.0000 mg | ORAL_TABLET | Freq: Three times a day (TID) | ORAL | Status: DC | PRN

## 2014-08-26 NOTE — Patient Instructions (Signed)
Your exam is NOT consistent with bursitis. Go to [facility] to get x-ray. We might consider changing your naproxen to an oral steroid, but I want to see x-ray results first. Use the tramadol if needed for severe pain (use caution with driving, might cause sleepiness).

## 2014-08-26 NOTE — Progress Notes (Signed)
Chief Complaint  Patient presents with  . Injections    patient desires left hip cortisone injection. Wonders if there may be a fractured bone in his hip because pain is so severe.   Patient presents for follow up of left hip pain, which has gotten worse.  He would like cortisone injection (for possible bursitis). Left hip pain has gotten a little worse, hurts with walking the dog.  NSAID hasn't helped much at all.  No side effects.  Denies pain in the groin.  Pain shoots down the side of the leg some.  Pain is with certain movements and weight-bearing.  Past Medical History  Diagnosis Date  . Pure hypercholesterolemia   . Impaired fasting glucose     (since his 20's; had yearly GTT's for 5 yrs, never progressed)  . Pseudogout of knee 10/04    left  . Adenomatous colon polyp 6/06    tubular adenoma, Dr. [name]  . Hemorrhoids     internal and external  . BCC (basal cell carcinoma), face 6/07    left face, R ear (04/2014)  . Insomnia   . Erectile dysfunction    Past Surgical History  Procedure Laterality Date  . Colonoscopy  6/06, 05/2010    Dr. [name]  . Tonsillectomy    . Mohs surgery  6/07    L face, Dr. Link Snuffer  . Vasectomy     History   Social History  . Marital Status: Widowed    Spouse Name: N/A    Number of Children: 2  . Years of Education: N/A   Occupational History  . retired    Social History Main Topics  . Smoking status: Never Smoker   . Smokeless tobacco: Never Used  . Alcohol Use: Yes     Comment: 1 light beer every afternoon  . Drug Use: No  . Sexual Activity: Not Currently   Other Topics Concern  . Not on file   Social History Narrative   Widowed 2013 (after wife's long battle with MS).  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  Children live in [place], [place] and [place]   Outpatient Encounter Prescriptions as of 08/26/2014  Medication Sig Note  . acetaminophen (TYLENOL) 500 MG tablet Take 500 mg by  mouth 2 (two) times daily.   Marland Kitchen aspirin 81 MG tablet Take 81 mg by mouth daily.   06/17/2013: Avoids taking aspirin when doing a lot of yardwork in summer; takes daily in winter  . Azelastine-Fluticasone (DYMISTA) 137-50 MCG/ACT SUSP Place 1 spray into the nose 2 (two) times daily. 12/07/2013: Given sample--uses prn; hasn't needed  . Magnesium 250 MG TABS Take 1 tablet by mouth daily.    . naproxen (NAPROSYN) 500 MG tablet Take 1 tablet (500 mg total) by mouth 2 (two) times daily with a meal.   . sildenafil (VIAGRA) 50 MG tablet Take 1 tablet (50 mg total) by mouth daily as needed for erectile dysfunction.   . simvastatin (ZOCOR) 40 MG tablet TAKE 1 TABLET AT BEDTIME   . vitamin E 400 UNIT capsule Take 400 Units by mouth daily.   Marland Kitchen zolpidem (AMBIEN) 5 MG tablet TAKE ONE-HALF TO ONE TABLET BY MOUTH AT BEDTIME AS NEEDED   . traMADol (ULTRAM) 50 MG tablet Take 1 tablet (50 mg total) by mouth every 8 (eight) hours as needed for moderate pain or severe pain.    Allergies  Allergen Reactions  . Codeine     REACTION:  violent vomiting   ROS:  No fevers, chills, rashes, bleeding, bruising, GI complaints, bowel changes. No URI symptoms, cough, headaches, chest pain, shortness of breath or other problems.  PHYSICAL EXAM: BP 150/80  Pulse 72  Ht 6\' 1"  (1.854 m)  Wt 173 lb (78.472 kg)  BMI 22.83 kg/m2  Well developed, pleasant male in no distress Left hip--area of discomfort is lateral, around the trochanteric bursa, and continuining down laterally. There is no tenderness to palpation over this area.  Normal ROM.    ASSESSMENT/PLAN:  Left hip pain - Plan: DG Hip Complete Left, traMADol (ULTRAM) 50 MG tablet  Given nontender trochanteric bursa, I did not feel that bursitis was proper diagnosis and did not want to perform injection. Sent to [facility] for x-ray.  This showed significant degeneratives changes.   X-ray results were reviewed with pt:  Advanced osteoarthritic changes of the LEFT hip  joint with joint  space narrowing and spur formation.   Recommended that he see ortho for consult.  In the interim, use the tramadol prn severe pain.  Can use Tylenol arthritis prn for pain. Consider using NSAID less, since likely isn't an inflammatory condition.  Risks and side effects of meds were reviewed in detail.

## 2014-09-01 DIAGNOSIS — M1612 Unilateral primary osteoarthritis, left hip: Secondary | ICD-10-CM | POA: Diagnosis not present

## 2014-09-02 DIAGNOSIS — M1612 Unilateral primary osteoarthritis, left hip: Secondary | ICD-10-CM | POA: Diagnosis not present

## 2014-10-04 ENCOUNTER — Other Ambulatory Visit: Payer: Self-pay | Admitting: Family Medicine

## 2014-10-04 ENCOUNTER — Telehealth: Payer: Self-pay | Admitting: *Deleted

## 2014-10-04 DIAGNOSIS — N528 Other male erectile dysfunction: Secondary | ICD-10-CM

## 2014-10-04 MED ORDER — SILDENAFIL CITRATE 100 MG PO TABS: 100.0000 mg | ORAL_TABLET | Freq: Every day | ORAL | Status: DC | PRN

## 2014-10-04 NOTE — Telephone Encounter (Signed)
Ok to refill but he needs to see ortho.  Please find out if he ever saw ortho (he was planning to call himself, after I gave him x-ray results).  No notes have been received. This is not to be used long-term for pain control without ortho eval (risks to kidney, ulcer, etc). Also okay to refill the viagra as requested.

## 2014-10-04 NOTE — Telephone Encounter (Signed)
Called patient and he is requesting naproxen refill as this is the only thing that helps his hip pain. While on the telephone with him he asked for an rx fro Viagra 100mg  (to take 1/2 tab prn) #10 to [facility].

## 2014-10-04 NOTE — Telephone Encounter (Signed)
Sent rx's, left message asking if he had already seen ortho and if not asking him to please see one. I also went over that the naprosyn is not to be used long-term with ortho eval. He will call me back if has already seen one. If not, he will schedule with one.

## 2014-10-04 NOTE — Telephone Encounter (Signed)
Patient called back and states that he did see Dr.Dalldorf at [facility]. Surgery was recommended but patient declined. Did receive a cortisone shot that helped some, along with the naprosyn. He is aware that he will eventually need another shot and or surgery but is trying to avoid that as long as he can. I called them to have them send noted for you. I will bring to you once we receive.

## 2014-12-06 ENCOUNTER — Other Ambulatory Visit: Payer: Medicare Other

## 2014-12-06 ENCOUNTER — Telehealth (INDEPENDENT_AMBULATORY_CARE_PROVIDER_SITE_OTHER): Payer: Medicare Other | Admitting: *Deleted

## 2014-12-06 DIAGNOSIS — Z79899 Other long term (current) drug therapy: Secondary | ICD-10-CM | POA: Diagnosis not present

## 2014-12-06 DIAGNOSIS — E7800 Pure hypercholesterolemia, unspecified: Secondary | ICD-10-CM

## 2014-12-06 DIAGNOSIS — R350 Frequency of micturition: Secondary | ICD-10-CM

## 2014-12-06 DIAGNOSIS — R7301 Impaired fasting glucose: Secondary | ICD-10-CM | POA: Diagnosis not present

## 2014-12-06 DIAGNOSIS — N3946 Mixed incontinence: Secondary | ICD-10-CM

## 2014-12-06 DIAGNOSIS — Z125 Encounter for screening for malignant neoplasm of prostate: Secondary | ICD-10-CM

## 2014-12-06 LAB — CBC WITH DIFFERENTIAL/PLATELET
Basophils Absolute: 0.1 10*3/uL (ref 0.0–0.1)
Basophils Relative: 1 % (ref 0–1)
Eosinophils Absolute: 0.3 10*3/uL (ref 0.0–0.7)
Eosinophils Relative: 6 % — ABNORMAL HIGH (ref 0–5)
HCT: 44.7 % (ref 39.0–52.0)
Hemoglobin: 14.8 g/dL (ref 13.0–17.0)
Lymphocytes Relative: 26 % (ref 12–46)
Lymphs Abs: 1.5 10*3/uL (ref 0.7–4.0)
MCH: 29.7 pg (ref 26.0–34.0)
MCHC: 33.1 g/dL (ref 30.0–36.0)
MCV: 89.8 fL (ref 78.0–100.0)
MPV: 9.2 fL (ref 8.6–12.4)
Monocytes Absolute: 0.6 10*3/uL (ref 0.1–1.0)
Monocytes Relative: 10 % (ref 3–12)
NEUTROS ABS: 3.2 10*3/uL (ref 1.7–7.7)
Neutrophils Relative %: 57 % (ref 43–77)
Platelets: 239 10*3/uL (ref 150–400)
RBC: 4.98 MIL/uL (ref 4.22–5.81)
RDW: 13.6 % (ref 11.5–15.5)
WBC: 5.7 10*3/uL (ref 4.0–10.5)

## 2014-12-06 LAB — COMPREHENSIVE METABOLIC PANEL
ALK PHOS: 62 U/L (ref 39–117)
ALT: 19 U/L (ref 0–53)
AST: 21 U/L (ref 0–37)
Albumin: 4.2 g/dL (ref 3.5–5.2)
BUN: 16 mg/dL (ref 6–23)
CO2: 25 meq/L (ref 19–32)
CREATININE: 1.04 mg/dL (ref 0.50–1.35)
Calcium: 9.4 mg/dL (ref 8.4–10.5)
Chloride: 105 mEq/L (ref 96–112)
GLUCOSE: 105 mg/dL — AB (ref 70–99)
POTASSIUM: 4.6 meq/L (ref 3.5–5.3)
Sodium: 139 mEq/L (ref 135–145)
Total Bilirubin: 0.7 mg/dL (ref 0.2–1.2)
Total Protein: 6.2 g/dL (ref 6.0–8.3)

## 2014-12-06 LAB — POCT URINALYSIS DIPSTICK
Bilirubin, UA: NEGATIVE
Blood, UA: NEGATIVE
Glucose, UA: NEGATIVE
Ketones, UA: NEGATIVE
Leukocytes, UA: NEGATIVE
Nitrite, UA: NEGATIVE
PH UA: 6
Protein, UA: NEGATIVE
SPEC GRAV UA: 1.02
Urobilinogen, UA: NEGATIVE

## 2014-12-06 LAB — LIPID PANEL
CHOL/HDL RATIO: 3.4 ratio
CHOLESTEROL: 164 mg/dL (ref 0–200)
HDL: 48 mg/dL (ref >39)
LDL Cholesterol: 96 mg/dL (ref 0–99)
TRIGLYCERIDES: 98 mg/dL (ref <150)
VLDL: 20 mg/dL (ref 0–40)

## 2014-12-06 NOTE — Telephone Encounter (Signed)
UA performed, results normal.

## 2014-12-06 NOTE — Telephone Encounter (Signed)
Advise pt of normal results, and offer sooner appt for eval of his symptoms if he would like (prostate problems can cause similar symptoms; it is NOT bladder infection with normal u/a)

## 2014-12-06 NOTE — Telephone Encounter (Signed)
Patient Eric Simmons states he will wait until CPE, not that urgent.

## 2014-12-06 NOTE — Telephone Encounter (Signed)
Patient was in this morning for labs, he is not scheduled to see you until 12/20/14. He was complaining of urinary issues. Feels like his bladder isn't emptying well, having some leaking as well as increased frequency. Offered patient appt-he only wanted to leave me a urine sample. Do you want me to test? Or does he need appt?

## 2014-12-06 NOTE — Telephone Encounter (Signed)
Ok to perform urine dip (and use sx as dx code)

## 2014-12-07 ENCOUNTER — Other Ambulatory Visit: Payer: Self-pay | Admitting: Family Medicine

## 2014-12-07 LAB — TSH: TSH: 0.783 u[IU]/mL (ref 0.350–4.500)

## 2014-12-07 LAB — HEMOGLOBIN A1C
Hgb A1c MFr Bld: 5.8 % — ABNORMAL HIGH (ref <5.7)
Mean Plasma Glucose: 120 mg/dL — ABNORMAL HIGH (ref <117)

## 2014-12-07 LAB — PSA, MEDICARE: PSA: 1.87 ng/mL (ref <=4.00)

## 2014-12-07 NOTE — Telephone Encounter (Signed)
(  Last rx'd in Sept with #30 RFx1)  Okay to refill #30, no add'l refill

## 2014-12-07 NOTE — Telephone Encounter (Signed)
Is okay to refill? Patient had an appointment with you in 05/2014 and has another appointment to see you in February.

## 2014-12-07 NOTE — Telephone Encounter (Signed)
I called out the patient Ambien per Dr. Tomi Bamberger

## 2014-12-20 ENCOUNTER — Encounter: Payer: Medicare Other | Admitting: Family Medicine

## 2014-12-20 ENCOUNTER — Encounter: Payer: Self-pay | Admitting: Family Medicine

## 2014-12-20 ENCOUNTER — Ambulatory Visit (INDEPENDENT_AMBULATORY_CARE_PROVIDER_SITE_OTHER): Payer: Medicare Other | Admitting: Family Medicine

## 2014-12-20 VITALS — BP 132/82 | HR 72 | Ht 75.0 in | Wt 176.0 lb

## 2014-12-20 DIAGNOSIS — R7301 Impaired fasting glucose: Secondary | ICD-10-CM | POA: Diagnosis not present

## 2014-12-20 DIAGNOSIS — G250 Essential tremor: Secondary | ICD-10-CM | POA: Diagnosis not present

## 2014-12-20 DIAGNOSIS — N529 Male erectile dysfunction, unspecified: Secondary | ICD-10-CM | POA: Diagnosis not present

## 2014-12-20 DIAGNOSIS — Z5181 Encounter for therapeutic drug level monitoring: Secondary | ICD-10-CM

## 2014-12-20 DIAGNOSIS — G4700 Insomnia, unspecified: Secondary | ICD-10-CM

## 2014-12-20 DIAGNOSIS — E7800 Pure hypercholesterolemia, unspecified: Secondary | ICD-10-CM

## 2014-12-20 MED ORDER — SILDENAFIL CITRATE 20 MG PO TABS: ORAL_TABLET | ORAL | Status: DC

## 2014-12-20 NOTE — Progress Notes (Signed)
Chief Complaint  Patient presents with  . Hyperlipidemia    nonfasting med check, labs already done. Requesting samples of 100mg  Viagra-rx for #4 was $135.     Left hip pain:  Advanced OA noted on x-ray in October.  He has been taking Arthri-D3 supplement, and pain is significantly better. No longer needing to take any pain medications. The Naproxen was very helpful, and he has plenty left over.  Erectile dysfunction:  Viagra has been effective, but is expensive.  He finds the 50mg  is partially effective, but he never increased the dose to try the 100mg .  Asking for samples.  Impaired fasting glucose. This has been very mild, ongoing/nonprogressive x years. He continues to exercise 3x/week at [facility].  20 mins on the bike (and not having any knee pain), weights (30-40 mins).  He has also been walking with his fiance; had to back off when his hip started hurting, but is walking 30 mins 3x/week. He plans to increase this.  Hyperlipidemia follow-up: Patient is reportedly following a low-fat, low cholesterol diet. Compliant with medications and denies medication side effects   Insomnia--he takes 1/2 tablet of 5mg  zolpidem every day. Denies side effects.  Familial tremor--not bothersome to patient, mainly just in his right hand. He doesn't pour his coffee cup as full, and hasn't had problems. He is no longer taking magnesium supplement that was helping with it, but his current arthritis supplement has a small amount of magnesium.  PMH, PSH, [place] updated and reviewed Engaged and getting married the end of March  Outpatient Encounter Prescriptions as of 12/20/2014  Medication Sig Note  . glucosamine-chondroitin 500-400 MG tablet Take 2 tablets by mouth 2 (two) times daily. 12/20/2014: Also contains 1000IU of vitamin d3 and 40mg  magnesium  . sildenafil (VIAGRA) 100 MG tablet Take 1 tablet (100 mg total) by mouth daily as needed for erectile dysfunction. 12/20/2014: Never filled it due to cost  .  simvastatin (ZOCOR) 40 MG tablet TAKE 1 TABLET AT BEDTIME   . zolpidem (AMBIEN) 5 MG tablet TAKE ONE-HALF TO ONE TABLET BY MOUTH AT BEDTIME AS NEEDED FOR SLEEP 12/20/2014: Takes 1/2 every night.  Marland Kitchen acetaminophen (TYLENOL) 500 MG tablet Take 500 mg by mouth 2 (two) times daily. 12/20/2014: No longer needing; uses prn arthritis pain  . aspirin 81 MG tablet Take 81 mg by mouth daily.   06/17/2013: Avoids taking aspirin when doing a lot of yardwork in summer; takes daily in winter  . Azelastine-Fluticasone (DYMISTA) 137-50 MCG/ACT SUSP Place 1 spray into the nose 2 (two) times daily. (Patient not taking: Reported on 12/20/2014) 12/20/2014: Given sample; uses seasonally, not needing  . naproxen (NAPROSYN) 500 MG tablet TAKE ONE TABLET BY MOUTH TWICE DAILY WITH A MEAL (Patient not taking: Reported on 12/20/2014) 12/20/2014: Not currently needing; use prn significant oa pain  . sildenafil (REVATIO) 20 MG tablet Take 3-5 tablet by mouth once daily as needed for erectile dysfunction   . [DISCONTINUED] Magnesium 250 MG TABS Take 1 tablet by mouth daily.    . [DISCONTINUED] traMADol (ULTRAM) 50 MG tablet Take 1 tablet (50 mg total) by mouth every 8 (eight) hours as needed for moderate pain or severe pain. (Patient not taking: Reported on 12/20/2014)   . [DISCONTINUED] vitamin E 400 UNIT capsule Take 400 Units by mouth daily.   (revatio rx'd today, not prior to visit).  Allergies  Allergen Reactions  . Codeine     REACTION: violent vomiting   ROS:  No fever, chills,  headaches, dizziness, chest pain, URI symptoms ,cough, shortness of breath, GI complaints, GU complaints (other than ED). He stopped taking aspirin due to bruising while working in the yard.  No depression, anxiety.  Insomnia--chronic.  See HPI.  No longer having knee pain, and hip pain is significantly improved.  PHYSICAL EXAM: BP 132/82 mmHg  Pulse 72  Ht 6\' 3"  (1.905 m)  Wt 176 lb (79.833 kg)  BMI 22.00 kg/m2 Well developed, pleasant male in no  distress, in good spirits HEENT: PERRL, EOMI, conjunctiva clear Neck: no lymphadenopathy, thyromegaly or carotid bruit Heart: regular rate and rhythm without murmur Lungs: clear bilaterally Abdomen: soft, nontender, no organomegaly or mass Extremities: 2+ pulse, no edema.  Psych: normal mood, affect, hygiene and grooming Neuro: alert and oriented. Normal strength, gait Skin: no lesions/rash  Lab Results  Component Value Date   HGBA1C 5.8* 12/06/2014   Lab Results  Component Value Date   CHOL 164 12/06/2014   HDL 48 12/06/2014   LDLCALC 96 12/06/2014   TRIG 98 12/06/2014   CHOLHDL 3.4 12/06/2014   Lab Results  Component Value Date   WBC 5.7 12/06/2014   HGB 14.8 12/06/2014   HCT 44.7 12/06/2014   MCV 89.8 12/06/2014   PLT 239 12/06/2014   Lab Results  Component Value Date   TSH 0.783 12/06/2014   Lab Results  Component Value Date   PSA 1.87 12/06/2014   PSA 1.50 11/30/2013   PSA 1.22 12/09/2012     Chemistry      Component Value Date/Time   NA 139 12/06/2014 0904   K 4.6 12/06/2014 0904   CL 105 12/06/2014 0904   CO2 25 12/06/2014 0904   BUN 16 12/06/2014 0904   CREATININE 1.04 12/06/2014 0904      Component Value Date/Time   CALCIUM 9.4 12/06/2014 0904   ALKPHOS 62 12/06/2014 0904   AST 21 12/06/2014 0904   ALT 19 12/06/2014 0904   BILITOT 0.7 12/06/2014 0904     Fasting glucose 105  Urine dip normal.  ASSESSMENT/PLAN:  Pure hypercholesterolemia - at goal; continue current treatment and low cholesterol diet  Benign familial tremor - very minimal, not requiring treatment  IFG (impaired fasting glucose) - stable, mild. continue daily exercise and limiting sweets  Insomnia - controlled with 2.5mg  ambien daily.  reviewed risks/side effects/alternatives.  continue current med  Erectile dysfunction, unspecified erectile dysfunction type - trial of generic to save on cost; #2 50mg  sample Viagra tablets given. Call if needs new Rx for another  pharmacy (ie [place]) if needed - Plan: sildenafil (REVATIO) 20 MG tablet   Briefly mentioned belsomra in case ambien eventually is not covered by his insurance. He just got ambien filled 2/9 (for #30, which lasts 2 months). He reports he just received mail order #90 of simvastatin.  F/u 6 months for CPE/AWV/med check (might be changing insurance, so not sure what will be covered). Fasting labs prior  Discussed trying generic sildafenil vs [place].  Will try the generic first.

## 2014-12-20 NOTE — Patient Instructions (Signed)
Continue your current medications. We will try the generic, lower dose for viagra--take 3-5 at a time to be equivalent to the 50-100mg  Viagra. If this is not an option (ie denied by insurance or still too expensive), call us for a written prescription to shop around elsewhere.  Continue your supplements which appear to be helping with your arthritis pain.,  Use the tylenol and/or naproxen only if needed.  Your sugar remains just slightly elevated.  Continue regular exercise, and avoiding excess sweets (wedding celebrations are allowed!!).

## 2015-02-18 DIAGNOSIS — S0180XA Unspecified open wound of other part of head, initial encounter: Secondary | ICD-10-CM | POA: Diagnosis not present

## 2015-02-18 DIAGNOSIS — S0083XA Contusion of other part of head, initial encounter: Secondary | ICD-10-CM | POA: Diagnosis not present

## 2015-02-27 ENCOUNTER — Other Ambulatory Visit: Payer: Self-pay | Admitting: Family Medicine

## 2015-02-28 ENCOUNTER — Other Ambulatory Visit: Payer: Self-pay | Admitting: Family Medicine

## 2015-02-28 NOTE — Telephone Encounter (Signed)
Is this okay to call in? 

## 2015-02-28 NOTE — Telephone Encounter (Signed)
#  30 with 1 refill ok

## 2015-03-21 DIAGNOSIS — L905 Scar conditions and fibrosis of skin: Secondary | ICD-10-CM | POA: Diagnosis not present

## 2015-03-21 DIAGNOSIS — L570 Actinic keratosis: Secondary | ICD-10-CM | POA: Diagnosis not present

## 2015-03-21 DIAGNOSIS — C44612 Basal cell carcinoma of skin of right upper limb, including shoulder: Secondary | ICD-10-CM | POA: Diagnosis not present

## 2015-03-21 DIAGNOSIS — L820 Inflamed seborrheic keratosis: Secondary | ICD-10-CM | POA: Diagnosis not present

## 2015-03-21 DIAGNOSIS — D485 Neoplasm of uncertain behavior of skin: Secondary | ICD-10-CM | POA: Diagnosis not present

## 2015-03-21 DIAGNOSIS — L821 Other seborrheic keratosis: Secondary | ICD-10-CM | POA: Diagnosis not present

## 2015-03-21 DIAGNOSIS — Z85828 Personal history of other malignant neoplasm of skin: Secondary | ICD-10-CM | POA: Diagnosis not present

## 2015-03-23 ENCOUNTER — Telehealth: Payer: Self-pay | Admitting: *Deleted

## 2015-03-23 MED ORDER — SILDENAFIL CITRATE 100 MG PO TABS: 100.0000 mg | ORAL_TABLET | Freq: Every day | ORAL | Status: DC | PRN

## 2015-03-23 NOTE — Telephone Encounter (Signed)
[facility] for #10 with 5 refills

## 2015-03-23 NOTE — Telephone Encounter (Signed)
Patient called an asked for viagra 100mg  be called into [facility] on [address]-also requesting samples if we have (we do have).

## 2015-03-24 DIAGNOSIS — H2513 Age-related nuclear cataract, bilateral: Secondary | ICD-10-CM | POA: Diagnosis not present

## 2015-03-24 DIAGNOSIS — H25013 Cortical age-related cataract, bilateral: Secondary | ICD-10-CM | POA: Diagnosis not present

## 2015-03-24 DIAGNOSIS — D3132 Benign neoplasm of left choroid: Secondary | ICD-10-CM | POA: Diagnosis not present

## 2015-04-19 DIAGNOSIS — D3132 Benign neoplasm of left choroid: Secondary | ICD-10-CM | POA: Diagnosis not present

## 2015-04-19 DIAGNOSIS — H43813 Vitreous degeneration, bilateral: Secondary | ICD-10-CM | POA: Diagnosis not present

## 2015-05-12 ENCOUNTER — Other Ambulatory Visit: Payer: Self-pay | Admitting: Family Medicine

## 2015-06-15 ENCOUNTER — Other Ambulatory Visit: Payer: Medicare Other

## 2015-06-15 DIAGNOSIS — Z5181 Encounter for therapeutic drug level monitoring: Secondary | ICD-10-CM | POA: Diagnosis not present

## 2015-06-15 DIAGNOSIS — R7301 Impaired fasting glucose: Secondary | ICD-10-CM | POA: Diagnosis not present

## 2015-06-15 LAB — COMPREHENSIVE METABOLIC PANEL
ALK PHOS: 71 U/L (ref 40–115)
ALT: 19 U/L (ref 9–46)
AST: 25 U/L (ref 10–35)
Albumin: 4.1 g/dL (ref 3.6–5.1)
BUN: 17 mg/dL (ref 7–25)
CO2: 27 mmol/L (ref 20–31)
Calcium: 9.7 mg/dL (ref 8.6–10.3)
Chloride: 101 mmol/L (ref 98–110)
Creat: 0.96 mg/dL (ref 0.70–1.18)
Glucose, Bld: 103 mg/dL — ABNORMAL HIGH (ref 65–99)
POTASSIUM: 4.4 mmol/L (ref 3.5–5.3)
Sodium: 136 mmol/L (ref 135–146)
Total Bilirubin: 0.7 mg/dL (ref 0.2–1.2)
Total Protein: 6.6 g/dL (ref 6.1–8.1)

## 2015-06-15 LAB — HEMOGLOBIN A1C
Hgb A1c MFr Bld: 6 % — ABNORMAL HIGH (ref <5.7)
Mean Plasma Glucose: 126 mg/dL — ABNORMAL HIGH (ref <117)

## 2015-06-21 ENCOUNTER — Encounter: Payer: Self-pay | Admitting: Internal Medicine

## 2015-06-22 ENCOUNTER — Encounter: Payer: Self-pay | Admitting: Family Medicine

## 2015-06-22 ENCOUNTER — Ambulatory Visit (INDEPENDENT_AMBULATORY_CARE_PROVIDER_SITE_OTHER): Payer: Medicare Other | Admitting: Family Medicine

## 2015-06-22 ENCOUNTER — Encounter: Payer: Self-pay | Admitting: *Deleted

## 2015-06-22 VITALS — BP 140/84 | HR 60 | Ht 73.5 in | Wt 172.6 lb

## 2015-06-22 DIAGNOSIS — G4700 Insomnia, unspecified: Secondary | ICD-10-CM

## 2015-06-22 DIAGNOSIS — G250 Essential tremor: Secondary | ICD-10-CM | POA: Diagnosis not present

## 2015-06-22 DIAGNOSIS — E7800 Pure hypercholesterolemia, unspecified: Secondary | ICD-10-CM

## 2015-06-22 DIAGNOSIS — Z5181 Encounter for therapeutic drug level monitoring: Secondary | ICD-10-CM | POA: Diagnosis not present

## 2015-06-22 DIAGNOSIS — R030 Elevated blood-pressure reading, without diagnosis of hypertension: Secondary | ICD-10-CM | POA: Diagnosis not present

## 2015-06-22 DIAGNOSIS — Z125 Encounter for screening for malignant neoplasm of prostate: Secondary | ICD-10-CM | POA: Diagnosis not present

## 2015-06-22 DIAGNOSIS — N529 Male erectile dysfunction, unspecified: Secondary | ICD-10-CM | POA: Diagnosis not present

## 2015-06-22 DIAGNOSIS — Z23 Encounter for immunization: Secondary | ICD-10-CM | POA: Diagnosis not present

## 2015-06-22 DIAGNOSIS — Z0000 Encounter for general adult medical examination without abnormal findings: Secondary | ICD-10-CM

## 2015-06-22 DIAGNOSIS — R7301 Impaired fasting glucose: Secondary | ICD-10-CM | POA: Diagnosis not present

## 2015-06-22 MED ORDER — SILDENAFIL CITRATE 100 MG PO TABS: 100.0000 mg | ORAL_TABLET | Freq: Every day | ORAL | Status: DC | PRN
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
## 2015-06-22 NOTE — Patient Instructions (Addendum)
HEALTH MAINTENANCE RECOMMENDATIONS:  It is recommended that you get at least 30 minutes of aerobic exercise at least 5 days/week (for weight loss, you may need as much as 60-90 minutes). This can be any activity that gets your heart rate up. This can be divided in 10-15 minute intervals if needed, but try and build up your endurance at least once a week.  Weight bearing exercise is also recommended twice weekly.  Eat a healthy diet with lots of vegetables, fruits and fiber.  "Colorful" foods have a lot of vitamins (ie green vegetables, tomatoes, red peppers, etc).  Limit sweet tea, regular sodas and alcoholic beverages, all of which has a lot of calories and sugar.  Up to 2 alcoholic drinks daily may be beneficial for men (unless trying to lose weight, watch sugars).  Drink a lot of water.  Sunscreen of at least SPF 30 should be used on all sun-exposed parts of the skin when outside between the hours of 10 am and 4 pm (not just when at beach or pool, but even with exercise, golf, tennis, and yard work!)  Use a sunscreen that says "broad spectrum" so it covers both UVA and UVB rays, and make sure to reapply every 1-2 hours.  Remember to change the batteries in your smoke detectors when changing your clock times in the spring and fall.  Use your seat belt every time you are in a car, and please drive safely and not be distracted with cell phones and texting while driving.  Please try and check your blood pressures regularly, and keep a record as we discussed.  Follow up with me if your blood pressure is consistently running over 140/90, as medications would be indicated. Try and follow a low sodium diet.  You can feel free to experiment with glucosamine/chondroitin to see if it is influencing the blood pressure.  Low-Sodium Eating Plan Sodium raises blood pressure and causes water to be held in the body. Getting less sodium from food will help lower your blood pressure, reduce any swelling, and  protect your heart, liver, and kidneys. We get sodium by adding salt (sodium chloride) to food. Most of our sodium comes from canned, boxed, and frozen foods. Restaurant foods, fast foods, and pizza are also very high in sodium. Even if you take medicine to lower your blood pressure or to reduce fluid in your body, getting less sodium from your food is important. WHAT IS MY PLAN? Most people should limit their sodium intake to 2,300 mg a day. Your health care provider recommends that you limit your sodium intake to __________ a day.  WHAT DO I NEED TO KNOW ABOUT THIS EATING PLAN? For the low-sodium eating plan, you will follow these general guidelines:  Choose foods with a % Daily Value for sodium of less than 5% (as listed on the food label).   Use salt-free seasonings or herbs instead of table salt or sea salt.   Check with your health care provider or pharmacist before using salt substitutes.   Eat fresh foods.  Eat more vegetables and fruits.  Limit canned vegetables. If you do use them, rinse them well to decrease the sodium.   Limit cheese to 1 oz (28 g) per day.   Eat lower-sodium products, often labeled as "lower sodium" or "no salt added."  Avoid foods that contain monosodium glutamate (MSG). MSG is sometimes added to [place] food and some canned foods.  Check food labels (Nutrition Facts labels) on foods to learn  how much sodium is in one serving.  Eat more home-cooked food and less restaurant, buffet, and fast food.  When eating at a restaurant, ask that your food be prepared with less salt or none, if possible.  HOW DO I READ FOOD LABELS FOR SODIUM INFORMATION? The Nutrition Facts label lists the amount of sodium in one serving of the food. If you eat more than one serving, you must multiply the listed amount of sodium by the number of servings. Food labels may also identify foods as:  Sodium free--Less than 5 mg in a serving.  Very low sodium--35 mg or less  in a serving.  Low sodium--140 mg or less in a serving.  Light in sodium--50% less sodium in a serving. For example, if a food that usually has 300 mg of sodium is changed to become light in sodium, it will have 150 mg of sodium.  Reduced sodium--25% less sodium in a serving. For example, if a food that usually has 400 mg of sodium is changed to reduced sodium, it will have 300 mg of sodium. WHAT FOODS CAN I EAT? Grains Low-sodium cereals, including oats, puffed wheat and rice, and shredded wheat cereals. Low-sodium crackers. Unsalted rice and pasta. Lower-sodium bread.  Vegetables Frozen or fresh vegetables. Low-sodium or reduced-sodium canned vegetables. Low-sodium or reduced-sodium tomato sauce and paste. Low-sodium or reduced-sodium tomato and vegetable juices.  Fruits Fresh, frozen, and canned fruit. Fruit juice.  Meat and Other Protein Products Low-sodium canned tuna and salmon. Fresh or frozen meat, poultry, seafood, and fish. Lamb. Unsalted nuts. Dried beans, peas, and lentils without added salt. Unsalted canned beans. Homemade soups without salt. Eggs.  Dairy Milk. Soy milk. Ricotta cheese. Low-sodium or reduced-sodium cheeses. Yogurt.  Condiments Fresh and dried herbs and spices. Salt-free seasonings. Onion and garlic powders. Low-sodium varieties of mustard and ketchup. Lemon juice.  Fats and Oils Reduced-sodium salad dressings. Unsalted butter.  Other Unsalted popcorn and pretzels.  The items listed above may not be a complete list of recommended foods or beverages. Contact your dietitian for more options. WHAT FOODS ARE NOT RECOMMENDED? Grains Instant hot cereals. Bread stuffing, pancake, and biscuit mixes. Croutons. Seasoned rice or pasta mixes. Noodle soup cups. Boxed or frozen macaroni and cheese. Self-rising flour. Regular salted crackers. Vegetables Regular canned vegetables. Regular canned tomato sauce and paste. Regular tomato and vegetable juices. Frozen  vegetables in sauces. Salted french fries. Olives. Eric Simmons. Relishes. Sauerkraut. Salsa. Meat and Other Protein Products Salted, canned, smoked, spiced, or pickled meats, seafood, or fish. Bacon, ham, sausage, hot dogs, corned beef, chipped beef, and packaged luncheon meats. Salt pork. Jerky. Pickled herring. Anchovies, regular canned tuna, and sardines. Salted nuts. Dairy Processed cheese and cheese spreads. Cheese curds. Blue cheese and cottage cheese. Buttermilk.  Condiments Onion and garlic salt, seasoned salt, table salt, and sea salt. Canned and packaged gravies. Worcestershire sauce. Tartar sauce. Barbecue sauce. Teriyaki sauce. Soy sauce, including reduced sodium. Steak sauce. Fish sauce. Oyster sauce. Cocktail sauce. Horseradish. Regular ketchup and mustard. Meat flavorings and tenderizers. Bouillon cubes. Hot sauce. Tabasco sauce. Marinades. Taco seasonings. Relishes. Fats and Oils Regular salad dressings. Salted butter. Margarine. Ghee. Bacon fat.  Other Potato and tortilla chips. Corn chips and puffs. Salted popcorn and pretzels. Canned or dried soups. Pizza. Frozen entrees and pot pies.  The items listed above may not be a complete list of foods and beverages to avoid. Contact your dietitian for more information. Document Released: 04/06/2002 Document Revised: 10/20/2013 Document Reviewed: 08/19/2013 ExitCare Patient  Information 2015 [place], [place]. This information is not intended to replace advice given to you by your health care provider. Make sure you discuss any questions you have with your health care provider.   Eric Simmons , Thank you for taking time to come for your Medicare Wellness Visit. I appreciate your ongoing commitment to your health goals. Please review the following plan we discussed and let me know if I can assist you in the future.   These are the goals we discussed: Goals    None      This is a list of the screening recommended for you and due dates:    Health Maintenance  Topic Date Due  . Flu Shot  05/30/2015  . Colon Cancer Screening  06/08/2020  . Tetanus Vaccine  03/01/2021  . Shingles Vaccine  Completed  . Pneumonia vaccines  Completed   Colon cancer screening should be due 2016 (not 2021 as stated above).  Check with Dr. [name].  I recommend some sort of colon cancer screening--if he doesn't feel that colonoscopy is indicated now, then I recommend having Cologard (fecal DNA testing). Medicare does pay for this, and we would need to do referral.  Please try and get [place] copies of your updated Living will and healthcare power of attorney.

## 2015-06-30 ENCOUNTER — Other Ambulatory Visit: Payer: Self-pay | Admitting: Family Medicine

## 2015-06-30 NOTE — Telephone Encounter (Signed)
[facility] for #30 with 1 refill (which should last x 4 months, just like last rx did, using 1/2 tablet daily)

## 2015-06-30 NOTE — Telephone Encounter (Signed)
Is this okay?

## 2015-07-20 ENCOUNTER — Telehealth: Payer: Self-pay | Admitting: Family Medicine

## 2015-07-20 NOTE — Telephone Encounter (Signed)
Initiated P.A. Zolpidem

## 2015-07-25 NOTE — Telephone Encounter (Signed)
P.A. Zolpidem approved til 07/20/16, faxed pharmacy, left message for pt

## 2015-08-01 ENCOUNTER — Encounter: Payer: Self-pay | Admitting: Internal Medicine

## 2015-09-12 ENCOUNTER — Other Ambulatory Visit: Payer: Self-pay | Admitting: Family Medicine

## 2015-11-28 DIAGNOSIS — L821 Other seborrheic keratosis: Secondary | ICD-10-CM | POA: Diagnosis not present

## 2015-11-28 DIAGNOSIS — C44712 Basal cell carcinoma of skin of right lower limb, including hip: Secondary | ICD-10-CM | POA: Diagnosis not present

## 2015-11-28 DIAGNOSIS — D1801 Hemangioma of skin and subcutaneous tissue: Secondary | ICD-10-CM | POA: Diagnosis not present

## 2015-11-28 DIAGNOSIS — D485 Neoplasm of uncertain behavior of skin: Secondary | ICD-10-CM | POA: Diagnosis not present

## 2015-11-28 DIAGNOSIS — L853 Xerosis cutis: Secondary | ICD-10-CM | POA: Diagnosis not present

## 2015-11-28 DIAGNOSIS — Z85828 Personal history of other malignant neoplasm of skin: Secondary | ICD-10-CM | POA: Diagnosis not present

## 2015-11-28 DIAGNOSIS — L570 Actinic keratosis: Secondary | ICD-10-CM | POA: Diagnosis not present

## 2015-12-27 ENCOUNTER — Other Ambulatory Visit: Payer: PPO

## 2015-12-27 DIAGNOSIS — G250 Essential tremor: Secondary | ICD-10-CM

## 2015-12-27 DIAGNOSIS — E7800 Pure hypercholesterolemia, unspecified: Secondary | ICD-10-CM

## 2015-12-27 DIAGNOSIS — Z125 Encounter for screening for malignant neoplasm of prostate: Secondary | ICD-10-CM | POA: Diagnosis not present

## 2015-12-27 DIAGNOSIS — R7301 Impaired fasting glucose: Secondary | ICD-10-CM

## 2015-12-27 DIAGNOSIS — R030 Elevated blood-pressure reading, without diagnosis of hypertension: Secondary | ICD-10-CM | POA: Diagnosis not present

## 2015-12-27 DIAGNOSIS — Z5181 Encounter for therapeutic drug level monitoring: Secondary | ICD-10-CM

## 2015-12-27 DIAGNOSIS — N529 Male erectile dysfunction, unspecified: Secondary | ICD-10-CM | POA: Diagnosis not present

## 2015-12-27 LAB — CBC WITH DIFFERENTIAL/PLATELET
Basophils Absolute: 0.1 10*3/uL (ref 0.0–0.1)
Basophils Relative: 1 % (ref 0–1)
EOS PCT: 4 % (ref 0–5)
Eosinophils Absolute: 0.2 10*3/uL (ref 0.0–0.7)
HEMATOCRIT: 42.7 % (ref 39.0–52.0)
Hemoglobin: 14.4 g/dL (ref 13.0–17.0)
LYMPHS ABS: 1.4 10*3/uL (ref 0.7–4.0)
LYMPHS PCT: 26 % (ref 12–46)
MCH: 29.5 pg (ref 26.0–34.0)
MCHC: 33.7 g/dL (ref 30.0–36.0)
MCV: 87.5 fL (ref 78.0–100.0)
MPV: 9.4 fL (ref 8.6–12.4)
Monocytes Absolute: 0.5 10*3/uL (ref 0.1–1.0)
Monocytes Relative: 9 % (ref 3–12)
Neutro Abs: 3.2 10*3/uL (ref 1.7–7.7)
Neutrophils Relative %: 60 % (ref 43–77)
Platelets: 214 10*3/uL (ref 150–400)
RBC: 4.88 MIL/uL (ref 4.22–5.81)
RDW: 13.7 % (ref 11.5–15.5)
WBC: 5.3 10*3/uL (ref 4.0–10.5)

## 2015-12-27 LAB — LIPID PANEL
CHOLESTEROL: 140 mg/dL (ref 125–200)
HDL: 51 mg/dL (ref >=40)
LDL Cholesterol: 74 mg/dL (ref <130)
TRIGLYCERIDES: 77 mg/dL (ref <150)
Total CHOL/HDL Ratio: 2.7 Ratio (ref <=5.0)
VLDL: 15 mg/dL (ref <30)

## 2015-12-27 LAB — COMPREHENSIVE METABOLIC PANEL
ALT: 19 U/L (ref 9–46)
AST: 22 U/L (ref 10–35)
Albumin: 4.2 g/dL (ref 3.6–5.1)
Alkaline Phosphatase: 70 U/L (ref 40–115)
BUN: 14 mg/dL (ref 7–25)
CHLORIDE: 103 mmol/L (ref 98–110)
CO2: 25 mmol/L (ref 20–31)
Calcium: 9.5 mg/dL (ref 8.6–10.3)
Creat: 1.09 mg/dL (ref 0.70–1.18)
GLUCOSE: 104 mg/dL — AB (ref 65–99)
POTASSIUM: 4.7 mmol/L (ref 3.5–5.3)
Sodium: 138 mmol/L (ref 135–146)
Total Bilirubin: 0.6 mg/dL (ref 0.2–1.2)
Total Protein: 6.5 g/dL (ref 6.1–8.1)

## 2015-12-27 LAB — TSH: TSH: 0.87 mIU/L (ref 0.40–4.50)

## 2015-12-27 LAB — HEMOGLOBIN A1C
Hgb A1c MFr Bld: 5.9 % — ABNORMAL HIGH (ref <5.7)
Mean Plasma Glucose: 123 mg/dL — ABNORMAL HIGH (ref <117)

## 2015-12-28 ENCOUNTER — Encounter: Payer: Self-pay | Admitting: Family Medicine

## 2015-12-28 ENCOUNTER — Ambulatory Visit (INDEPENDENT_AMBULATORY_CARE_PROVIDER_SITE_OTHER): Payer: PPO | Admitting: Family Medicine

## 2015-12-28 VITALS — BP 130/80 | HR 60 | Ht 73.5 in | Wt 177.6 lb

## 2015-12-28 DIAGNOSIS — E7800 Pure hypercholesterolemia, unspecified: Secondary | ICD-10-CM | POA: Diagnosis not present

## 2015-12-28 DIAGNOSIS — R7301 Impaired fasting glucose: Secondary | ICD-10-CM | POA: Diagnosis not present

## 2015-12-28 DIAGNOSIS — G4700 Insomnia, unspecified: Secondary | ICD-10-CM

## 2015-12-28 DIAGNOSIS — G250 Essential tremor: Secondary | ICD-10-CM

## 2015-12-28 DIAGNOSIS — R1310 Dysphagia, unspecified: Secondary | ICD-10-CM

## 2015-12-28 HISTORY — DX: Dysphagia, unspecified: R13.10

## 2015-12-28 LAB — PSA, MEDICARE: PSA: 1.73 ng/mL (ref <=4.00)

## 2015-12-28 MED ORDER — SIMVASTATIN 40 MG PO TABS: 40.0000 mg | ORAL_TABLET | Freq: Every day | ORAL | Status: DC

## 2015-12-28 MED ORDER — ZOLPIDEM TARTRATE 5 MG PO TABS: ORAL_TABLET | ORAL | Status: DC

## 2015-12-28 NOTE — Progress Notes (Signed)
Chief Complaint  Patient presents with  . Med check    nonfasting med check, labs already done.    He has some intermittent problems swallowing over the past few months. He notices it with dry foods (had dry chicken last night). At one point he recalls only intermittently having problems with a large pill/vitamin (which he no longer takes).  Dysphagia is not consistent, only with dry solids. Denies any heartburn, nausea, vomiting or other GI complaints.  Impaired fasting glucose. This has been very mild, ongoing/nonprogressive x years. He continues to exercise regularly. No polydipsia, polyuria, numbness, tingling, weight changes or other concerns.  Hyperlipidemia follow-up: Patient is reportedly following a low-fat, low cholesterol diet. Compliant with medications and denies medication side effects   Insomnia--he takes only 1/4 tablet of 5mg  zolpidem about 6 days a week--this is just enough to help him get to sleep. This dose is effective, and he denies side effects. (has used 60 pills since 06/2015).  Familial tremor--not bothersome to patient, mainly just in his right hand. He doesn't pour his coffee cup as full, and hasn't had problems.  Erectile dysfunction: Viagra has been effective, but is expensive. He has been getting it from [place].   Left hip pain: Advanced OA noted on x-ray. He saw orthopedist and had a cortisone injection that was helpful.Overal, hip is doing well. He has been taking a glucosamine supplement which has been helpful.  He has some pain flare after a lot of activity such as yardwork.  Uses tylenol prn.  He reports being due for colon cancer screening, done every 5 years due to family history in his mother.  He had polyps in the past, but not recently. Review of chart shows that last colonoscopy 05/2010 had polyp--pathology showed submucosal leiomyoma, not adenoma.  PMH, PSH, SH reviewed and updated  Outpatient Encounter Prescriptions as of 12/28/2015  Medication  Sig Note  . glucosamine-chondroitin 500-400 MG tablet Take 2 tablets by mouth 2 (two) times daily. 06/22/2015: Also contains 1000IU of vitamin d3 and 40mg  magnesium. Cut back to just 3/day from 4/day  . Multiple Vitamins-Minerals (CENTRUM SILVER ADULT 50+ PO) Take 1 tablet by mouth daily.   . sildenafil (VIAGRA) 100 MG tablet Take 1 tablet (100 mg total) by mouth daily as needed for erectile dysfunction.   . simvastatin (ZOCOR) 40 MG tablet Take 1 tablet (40 mg total) by mouth at bedtime.   Marland Kitchen zolpidem (AMBIEN) 5 MG tablet TAKE ONE-HALF TO ONE TABLET BY MOUTH AT BEDTIME AS NEEDED FOR SLEEP   . [DISCONTINUED] simvastatin (ZOCOR) 40 MG tablet TAKE 1 TABLET AT BEDTIME   . [DISCONTINUED] zolpidem (AMBIEN) 5 MG tablet TAKE ONE-HALF TO ONE TABLET BY MOUTH AT BEDTIME AS NEEDED FOR SLEEP 12/28/2015: Takes 1/4 tablet most days of the week  . acetaminophen (TYLENOL) 500 MG tablet Take 500 mg by mouth 2 (two) times daily. Reported on 12/28/2015 06/22/2015: Uses prn pain, not often   No facility-administered encounter medications on file as of 12/28/2015.   Allergies  Allergen Reactions  . Codeine     REACTION: violent vomiting    ROS:  The patient denies anorexia, fever, headaches, decreased hearing, ear pain, hoarseness, chest pain, palpitations, dizziness, syncope, dyspnea on exertion, cough, swelling, nausea, vomiting, diarrhea, abdominal pain, melena, hematochezia, indigestion/heartburn, hematuria, incontinence, dysuria, numbness, tingling, weakness, suspicious skin lesions, depression, anxiety, abnormal bleeding/bruising, or enlarged lymph nodes  +mild tremor;+insomnia as per HPI +ED, controlled by Viagra  PHYSICAL EXAM: BP 130/80 mmHg  Pulse 60  Ht 6' 1.5" (1.867 m)  Wt 177 lb 9.6 oz (80.559 kg)  BMI 23.11 kg/m2 Well developed, pleasant male in no distress, in good spirits HEENT: PERRL, EOMI, conjunctiva clear, OP clear Neck: no lymphadenopathy, thyromegaly or carotid bruit Heart: regular rate  and rhythm without murmur Lungs: clear bilaterally Abdomen: soft, nontender, no organomegaly or mass Extremities: 2+ pulse, no edema.  Psych: normal mood, affect, hygiene and grooming Neuro: alert and oriented. Normal strength, gait Skin: no lesions/rash, normal turgor   Lab Results  Component Value Date   HGBA1C 5.9* 12/27/2015     Chemistry      Component Value Date/Time   NA 138 12/27/2015 0001   K 4.7 12/27/2015 0001   CL 103 12/27/2015 0001   CO2 25 12/27/2015 0001   BUN 14 12/27/2015 0001   CREATININE 1.09 12/27/2015 0001      Component Value Date/Time   CALCIUM 9.5 12/27/2015 0001   ALKPHOS 70 12/27/2015 0001   AST 22 12/27/2015 0001   ALT 19 12/27/2015 0001   BILITOT 0.6 12/27/2015 0001     Fasting glucose 104  Lab Results  Component Value Date   CHOL 140 12/27/2015   HDL 51 12/27/2015   LDLCALC 74 12/27/2015   TRIG 77 12/27/2015   CHOLHDL 2.7 12/27/2015   Lab Results  Component Value Date   TSH 0.87 12/27/2015   Lab Results  Component Value Date   PSA 1.73 12/27/2015   PSA 1.87 12/06/2014   PSA 1.50 11/30/2013   Lab Results  Component Value Date   WBC 5.3 12/27/2015   HGB 14.4 12/27/2015   HCT 42.7 12/27/2015   MCV 87.5 12/27/2015   PLT 214 12/27/2015   ASSESSMENT/PLAN:   Pure hypercholesterolemia - at goal; continue current medication - Plan: simvastatin (ZOCOR) 40 MG tablet  Benign familial tremor - very mild, tolerable  IFG (impaired fasting glucose) - mild, not progressive.  continue proper diet, regular exercise  Insomnia - controlled with very low dose ambien - Plan: zolpidem (AMBIEN) 5 MG tablet  Dysphagia - intermittent, with dry solids. Chew well, drink fluids, follow up with GI if persistent/worsening   Try and chew your food well, and drink fluids along with dry solids. If you have any persistent or worsening trouble swallowing, please contact us for referral to gastroenterologist for further evaluation. If you notice  any reflux or heartburn symptoms, start taking prilosec OTC daily and see if your symptoms.  Check with Dr. [name] to see if you need colonoscopy vs if you are a good candidate for the Cologard test (fecal DNA--if abnormal, you will then still need the colonoscopy).  It depends on your prior colonoscopies, if you had polyps, etc to see if he would prefer colonoscopy vs the Cologard.  He wants printed prescriptions since he has new insurance.  F/u 76mos at AWV/med check+, sooner prn

## 2015-12-28 NOTE — Patient Instructions (Signed)
  Try and chew your food well, and drink fluids along with dry solids. If you have any persistent or worsening trouble swallowing, please contact us for referral to gastroenterologist for further evaluation. If you notice any reflux or heartburn symptoms, start taking prilosec OTC daily and see if your symptoms.   Check with Dr. [name] to see if you need colonoscopy vs if you are a good candidate for the Cologard test (fecal DNA--if abnormal, you will then still need the colonoscopy).  It depends on your prior colonoscopies, if you had polyps, etc to see if he would prefer colonoscopy vs the Cologard.

## 2016-01-14 ENCOUNTER — Telehealth: Payer: Self-pay | Admitting: Family Medicine

## 2016-01-14 NOTE — Telephone Encounter (Signed)
P.A. ZOLPIDEM °

## 2016-01-16 NOTE — Telephone Encounter (Signed)
Approved til 10/28/16, left message for pt

## 2016-05-29 DIAGNOSIS — L821 Other seborrheic keratosis: Secondary | ICD-10-CM | POA: Diagnosis not present

## 2016-05-29 DIAGNOSIS — L578 Other skin changes due to chronic exposure to nonionizing radiation: Secondary | ICD-10-CM | POA: Diagnosis not present

## 2016-05-29 DIAGNOSIS — D485 Neoplasm of uncertain behavior of skin: Secondary | ICD-10-CM | POA: Diagnosis not present

## 2016-05-29 DIAGNOSIS — Z85828 Personal history of other malignant neoplasm of skin: Secondary | ICD-10-CM | POA: Diagnosis not present

## 2016-05-29 DIAGNOSIS — D1801 Hemangioma of skin and subcutaneous tissue: Secondary | ICD-10-CM | POA: Diagnosis not present

## 2016-05-29 DIAGNOSIS — L812 Freckles: Secondary | ICD-10-CM | POA: Diagnosis not present

## 2016-06-25 DIAGNOSIS — C44311 Basal cell carcinoma of skin of nose: Secondary | ICD-10-CM | POA: Diagnosis not present

## 2016-06-25 DIAGNOSIS — L570 Actinic keratosis: Secondary | ICD-10-CM | POA: Diagnosis not present

## 2016-06-25 DIAGNOSIS — Z85828 Personal history of other malignant neoplasm of skin: Secondary | ICD-10-CM | POA: Diagnosis not present

## 2016-06-25 DIAGNOSIS — L859 Epidermal thickening, unspecified: Secondary | ICD-10-CM | POA: Diagnosis not present

## 2016-06-25 DIAGNOSIS — D485 Neoplasm of uncertain behavior of skin: Secondary | ICD-10-CM | POA: Diagnosis not present

## 2016-06-25 DIAGNOSIS — L720 Epidermal cyst: Secondary | ICD-10-CM | POA: Diagnosis not present

## 2016-07-04 ENCOUNTER — Encounter: Payer: Self-pay | Admitting: Family Medicine

## 2016-07-12 ENCOUNTER — Encounter: Payer: Self-pay | Admitting: Family Medicine

## 2016-07-12 DIAGNOSIS — H35373 Puckering of macula, bilateral: Secondary | ICD-10-CM | POA: Diagnosis not present

## 2016-07-12 DIAGNOSIS — H43813 Vitreous degeneration, bilateral: Secondary | ICD-10-CM | POA: Diagnosis not present

## 2016-07-12 DIAGNOSIS — D3132 Benign neoplasm of left choroid: Secondary | ICD-10-CM | POA: Diagnosis not present

## 2016-08-17 NOTE — Progress Notes (Signed)
Chief Complaint  Patient presents with  . Medicare Wellness    fasting AWV/CPE. Did not do eye exam he prefers to do with his eye doc. His right hand as been having some numbness and tingling x 3-4 months. Is also having urinary frequency during the night 3-5 times (could not provide urine sample).     Eric Simmons. is a 75 y.o. male who presents for annual wellness visit and follow-up on chronic medical conditions.  He has the following concerns:  Right hand numbness/tingling x 3-4 months; never involves the thumb, mostly the 2nd through 4th fingers. Intermittent, related to activity such as yardwork. Numb over the weekend after using a trimmer (with a lot of vibration); resolves after the activity.  Denies neck pain (occasional crick, stretches resolve). Sometimes the hand numbness will wake him up at night. He shakes it out and it resolves. Denies hand weakness.  Nocturia, up 3-5x/night over the last few months.  Denies increase in frequency during the day. Denies hematuria, odor, dysuria. Occasional has a dribble of leakage during the night. No large incontinence. No changes to his evening routine, still drinks a small glass of milk at bedtime, a glass of wine or beer with dinner, not much later. Also drinks a glass of sweet tea with his evening meal.  Basal cell cancer found on his nose, diagnosed in August, planning on Mohs surgery with Dr. Sarajane Jews soon.  No longer having problems with dysphagia with dry solids. Swallowing his large glucosamine pill without a problem.  Impaired fasting glucose. This has been very mild, ongoing/nonprogressive x years. He continues to exercise regularly. No polydipsia, polyuria (but has developed nocturia, as above), weight changes.  +paresthesias as above, and sometimes in his toes R>L.  Hyperlipidemia follow-up: Patient is reportedly following a low-fat, low cholesterol diet. Compliant with medications and denies medication side effects    Insomnia--he takes only 1/4 tablet of 63m zolpidem about 6 days a week--this is just enough to help him get to sleep. This dose is effective, and he denies side effects or strange behaviors.  Familial tremor--not bothersome to patient, mainly just in his right hand. He doesn't pour his coffee cup as full, and hasn't had problems. Unchanged compared to previous visits, not worsening.  Erectile dysfunction: Viagra has been effective, but is expensive. [place] He will need a printed prescription. He has been having trouble achieving orgasm. Denies significantly decreased libido or significant fatigue (just if working in the yard a lot).  Left hip pain: Advanced OA noted on x-ray. He saw orthopedist and had a cortisone injection that was helpful.This was 2 years ago, and hasn't had significant recurrence of pain. Overal, hip is doing well. He has been taking a glucosamine supplement which has been helpful. He has some pain flare after a lot of activity such as yardwork.  Uses tylenol prn.  He reports being due for colon cancer screening, done every 5 years due to family history in his mother.  He had polyps in the past, but not recently. Review of chart shows that last colonoscopy 05/2010 had polyp--pathology showed submucosal leiomyoma, not adenoma. This was discussed last year, but he admits that he didn't check with Dr. [facility].  Immunization History  Administered Date(s) Administered  . Influenza Split 07/18/2011, 08/28/2012  . Influenza, High Dose Seasonal PF 08/19/2014, 06/22/2015  . Influenza,inj,Quad PF,36+ Mos 07/06/2013  . Pneumococcal Conjugate-13 06/14/2014  . Pneumococcal Polysaccharide-23 12/13/2011  . Tdap  03/02/2011  . Zoster 03/25/2014   Last colonoscopy: 05/2010; had been on a q5y schedule, so likely past due (+family history colon cancer, personal h/o polyps)--to contact Dr. Celesta Aver office to check Last PSA: 11/2015 Dentist:  twice yearly  Ophtho: May 2016, plans to schedule; (saw retinal specialist last month. Reports that the "freckle on retina" is stable) Exercise: exercise bike 3x/week at [facility], plus weight machines. Walks most days, 1-2 miles.  Other doctors caring for patient include: Ophtho--Dr. Peter Garter Retinal specialist (cannot recall name, on [address]) Gastroenterologist: Dr. [name]  Dermatologist: Dr. Jarome Matin Dentist: Dr. Lonia Chimera  Ortho:  [facility] (Dr. Mina Marble gave the hip injection 2 years ago).  Depression screen: negative Fall screen: negative Functional Status Survey: Short term memory isn't as good as it used to be.  Some trouble remembering names of new people he has met (through his new wife, many new people). Some leakage of urine sporadically, at nighttime, small drop.   See full questionnaires in epic.   End of Life Discussion:  Patient has a living will and medical power of attorney  Past Medical History:  Diagnosis Date  . Adenomatous colon polyp 6/06   tubular adenoma, Dr. [name]  . BCC (basal cell carcinoma), face 03/2006   left face, R ear (04/2014), nose 05/2016  . Erectile dysfunction   . Hemorrhoids    internal and external  . Hip arthritis    left  . Impaired fasting glucose    (since his 20's; had yearly GTT's for 5 yrs, never progressed)  . Insomnia   . Pseudogout of knee 10/04   left  . Pure hypercholesterolemia     Past Surgical History:  Procedure Laterality Date  . COLONOSCOPY  6/06, 05/2010   Dr. [name]  . MOHS SURGERY  6/07   L face, Dr. Link Snuffer  . TONSILLECTOMY    . VASECTOMY      Social History   Social History  . Marital status: Widowed    Spouse name: N/A  . Number of children: 2  . Years of education: N/A   Occupational History  . retired    Social History Main Topics  . Smoking status: Never Smoker  . Smokeless tobacco: Never Used  . Alcohol use 0.0 oz/week     Comment: 1 light beer every afternoon or before  dinner  (vs occasionally has [place] wine instead)  . Drug use: No  . Sexual activity: Yes   Other Topics Concern  . Not on file   Social History Narrative   Widowed 2013 (after wife's long battle with MS).  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  Children live in [place], [place] and [place].   Married March 2016 [name])    Family History  Problem Relation Age of Onset  . Cancer Mother 26    colon cancer  . Diabetes Mother   . Colon cancer Mother   . Heart disease Father     MI  . Hypertension Father   . Hyperlipidemia Brother   . Heart disease Brother     septal defect  . Tremor Brother   . Hyperlipidemia Brother   . Tremor Brother   . Hyperlipidemia Brother   . Hyperlipidemia Son   . Heart disease Son     irregular heart beat--being evaluated (2015)  . Syncope episode Son     getting evaluated with tilt table  . Hyperlipidemia Son     Outpatient  Encounter Prescriptions as of 08/20/2016  Medication Sig Note  . co-enzyme Q-10 30 MG capsule Take 30 mg by mouth daily.   Marland Kitchen glucosamine-chondroitin 500-400 MG tablet Take 1 tablet by mouth daily.  06/22/2015: Also contains 1000IU of vitamin d3 and 66m magnesium. Cut back to just 3/day from 4/day  . Multiple Vitamins-Minerals (CENTRUM SILVER ADULT 50+ PO) Take 1 tablet by mouth daily.   . sildenafil (VIAGRA) 100 MG tablet Take 0.5-1 tablets (50-100 mg total) by mouth daily as needed for erectile dysfunction.   . simvastatin (ZOCOR) 40 MG tablet Take 1 tablet (40 mg total) by mouth at bedtime.   .Marland Kitchenzolpidem (AMBIEN) 5 MG tablet TAKE ONE-HALF TO ONE TABLET BY MOUTH AT BEDTIME AS NEEDED FOR SLEEP   . [DISCONTINUED] sildenafil (VIAGRA) 100 MG tablet Take 1 tablet (100 mg total) by mouth daily as needed for erectile dysfunction.   . [DISCONTINUED] simvastatin (ZOCOR) 40 MG tablet Take 1 tablet (40 mg total) by mouth at bedtime.   . [DISCONTINUED] zolpidem (AMBIEN) 5 MG tablet TAKE ONE-HALF TO ONE  TABLET BY MOUTH AT BEDTIME AS NEEDED FOR SLEEP 08/20/2016: Takes 1/4 tablet  . acetaminophen (TYLENOL) 500 MG tablet Take 500 mg by mouth 2 (two) times daily. Reported on 12/28/2015 06/22/2015: Uses prn pain, not often   No facility-administered encounter medications on file as of 08/20/2016.     Allergies  Allergen Reactions  . Codeine     REACTION: violent vomiting    ROS: The patient denies anorexia, fever, weight changes, headaches, decreased hearing, ear pain, hoarseness, chest pain, palpitations, dizziness, syncope, dyspnea on exertion, cough, swelling, nausea, vomiting, diarrhea, abdominal pain, melena, hematochezia, indigestion/heartburn, hematuria, weakened urine stream, dysuria, genital lesions, joint pains, weakness, suspicious skin lesions (to have Mohs on nose soon), depression, anxiety, abnormal bleeding/bruising, or enlarged lymph nodes  +insomnia, controlled with low dose ambien. Occasional slight urinary hesitancy; nocturia as per HPI. Paresthesias R hand, and tremor on R as per HPI +ED, controlled with Viagra   PHYSICAL EXAM:  BP 130/80 (BP Location: Left Arm, Patient Position: Sitting, Cuff Size: Normal)   Pulse 68   Ht 6' 0.25" (1.835 m)   Wt 173 lb 6.4 oz (78.7 kg)   BMI 23.35 kg/m    General Appearance:  Alert, cooperative, no distress, appears stated age   Head:  Normocephalic, without obvious abnormality, atraumatic   Eyes:  PERRL, conjunctiva/corneas clear, EOM's intact, fundi benign. Brown pigmentation to upper portion of R eye (rest is blue)   Ears:  Normal TM's and external ear canals   Nose:  Nares normal, mucosa normal, no drainage or sinus tenderness   Throat:  Lips, mucosa, and tongue normal; teeth and gums normal   Neck:  Supple, no lymphadenopathy; thyroid: no enlargement/tenderness/nodules; no carotid  bruit or JVD. C-spine nontender, no muscle spasm  Back:  Spine nontender, no curvature, ROM normal, no CVA tenderness   Lungs:   Clear to auscultation bilaterally without wheezes, rales or ronchi; respirations unlabored   Chest Wall:  No tenderness or deformity   Heart:  Regular rate and rhythm, S1 and S2 normal, no murmur, rub or gallop   Breast Exam:  No chest wall tenderness, masses or gynecomastia   Abdomen:  Soft, non-tender, nondistended, normoactive bowel sounds, no masses, no hepatosplenomegaly   Genitalia:  Normal male external genitalia without lesions. Testicles without masses. No inguinal hernias.  Rectal:  Normal sphincter tone, no masses or tenderness; guaiac negative stool. Prostate smooth, no nodules, mildly  enlarged. nonboggy, nontender.  Extremities:  No clubbing, cyanosis or edema. Negative Tinel and Phalen.    Pulses:  2+ and symmetric all extremities   Skin:  Skin color, turgor normal, no rashes or lesions. Skin is very dry, especially on legs.   Lymph nodes:  Cervical, supraclavicular, and axillary nodes normal   Neurologic:  CNII-XII intact, normal strength, sensation and gait; reflexes 2+ and symmetric throughout. Very mild, fine resting tremor of RUE  Psych: Normal mood, affect, hygiene and grooming  Lab Results  Component Value Date   HGBA1C 5.8 08/20/2016    ASSESSMENT/PLAN:  Medicare annual wellness visit, subsequent  Pure hypercholesterolemia - Plan: Lipid panel, Hepatic function panel, simvastatin (ZOCOR) 40 MG tablet  IFG (impaired fasting glucose) - stable/unchanged.  Reviewed proper diet - Plan: HgB A1c, Glucose, random  Benign familial tremor - stable, unchanged/tolerable  Insomnia, unspecified type - controlled with 1/4 tablet of ambien most nights - Plan: zolpidem (AMBIEN) 5 MG tablet  Erectile dysfunction, unspecified erectile dysfunction type - Viagra is effective - Plan: Testosterone, sildenafil (VIAGRA) 100 MG tablet  Prostate cancer screening - normal PSA in February.  Normal exam without nodules (mild BPH)  Medication monitoring  encounter - Plan: CBC with Differential/Platelet, Lipid panel, Vitamin B12, Hepatic function panel  Paresthesias in right hand - no e/o CTS on exam, but suspect by history (occurs with activity) vs cervical radiculopathy - Plan: CBC with Differential/Platelet, Vitamin B12  Memory change - minimal (trouble with names); check B12 because also having paresthesias - Plan: TSH, Vitamin B12  Nocturia - mild BPH. Discussed behavioral measures (limit alcohol/caffeine/fluids) and saw palmetto  Benign prostatic hyperplasia with nocturia - trial of saw palmetto; consider tamsulosin if ineffective  Annual physical exam - Plan: POCT Urinalysis Dipstick  Need for prophylactic vaccination and inoculation against influenza - Plan: Flu vaccine HIGH DOSE PF (Fluzone High dose)    Hand tingling--? Etiology. If weakness/worsening, consider EMG/NCV Trial of brace with yardwork.  Glu, LFT, lipid, CBC, B12, testosterone (pt interested in having this checked; new problem with ejaculation)  Recommended at least 30 minutes of aerobic activity at least 5 days/week; proper sunscreen use reviewed; healthy diet and alcohol recommendations (less than or equal to 2 drinks/day) reviewed; regular seatbelt use; changing batteries in smoke detectors. Self-testicular exams. Immunization recommendations discussed--high dose flu shot yearly, given today. Colonoscopy recommendations reviewed--is past due (q5 yrs bc h/o polyps and family history colon cancer in his mother).  Pt to check with Dr. [name].  Briefly discussed Cologard (however if having polyps, and low risk for procedure, might prefer colonoscopy at this point. Last colonoscopy did not show adenomatous polyp)  AWV--MOST form reviewed and updated. Full Code, Full Care   Medicare Attestation I have personally reviewed: The patient's medical and social history Their use of alcohol, tobacco or illicit drugs Their current medications and supplements The patient's  functional ability including ADLs,fall risks, home safety risks, cognitive, and hearing and visual impairment Diet and physical activities Evidence for depression or mood disorders  The patient's weight, height, and BMI have been recorded in the chart.  I have made referrals, counseling, and provided education to the patient based on review of the above and I have provided the patient with a written personalized care plan for preventive services.     Lessa Huge A, MD   08/17/2016

## 2016-08-20 ENCOUNTER — Ambulatory Visit (INDEPENDENT_AMBULATORY_CARE_PROVIDER_SITE_OTHER): Payer: PPO | Admitting: Family Medicine

## 2016-08-20 ENCOUNTER — Encounter: Payer: Self-pay | Admitting: Family Medicine

## 2016-08-20 VITALS — BP 130/80 | HR 68 | Ht 72.25 in | Wt 173.4 lb

## 2016-08-20 DIAGNOSIS — Z0000 Encounter for general adult medical examination without abnormal findings: Secondary | ICD-10-CM

## 2016-08-20 DIAGNOSIS — N401 Enlarged prostate with lower urinary tract symptoms: Secondary | ICD-10-CM | POA: Diagnosis not present

## 2016-08-20 DIAGNOSIS — E7800 Pure hypercholesterolemia, unspecified: Secondary | ICD-10-CM

## 2016-08-20 DIAGNOSIS — N529 Male erectile dysfunction, unspecified: Secondary | ICD-10-CM

## 2016-08-20 DIAGNOSIS — Z5181 Encounter for therapeutic drug level monitoring: Secondary | ICD-10-CM | POA: Diagnosis not present

## 2016-08-20 DIAGNOSIS — Z23 Encounter for immunization: Secondary | ICD-10-CM

## 2016-08-20 DIAGNOSIS — G4700 Insomnia, unspecified: Secondary | ICD-10-CM | POA: Diagnosis not present

## 2016-08-20 DIAGNOSIS — R7301 Impaired fasting glucose: Secondary | ICD-10-CM

## 2016-08-20 DIAGNOSIS — R351 Nocturia: Secondary | ICD-10-CM | POA: Diagnosis not present

## 2016-08-20 DIAGNOSIS — R202 Paresthesia of skin: Secondary | ICD-10-CM

## 2016-08-20 DIAGNOSIS — Z125 Encounter for screening for malignant neoplasm of prostate: Secondary | ICD-10-CM

## 2016-08-20 DIAGNOSIS — G250 Essential tremor: Secondary | ICD-10-CM

## 2016-08-20 DIAGNOSIS — R413 Other amnesia: Secondary | ICD-10-CM | POA: Diagnosis not present

## 2016-08-20 LAB — POCT URINALYSIS DIPSTICK
BILIRUBIN UA: NEGATIVE
Blood, UA: NEGATIVE
GLUCOSE UA: NEGATIVE
KETONES UA: NEGATIVE
LEUKOCYTES UA: NEGATIVE
NITRITE UA: NEGATIVE
Protein, UA: NEGATIVE
Spec Grav, UA: >=1.03
Urobilinogen, UA: NEGATIVE
pH, UA: 6

## 2016-08-20 LAB — CBC WITH DIFFERENTIAL/PLATELET
Basophils Absolute: 0 cells/uL (ref 0–200)
Basophils Relative: 0 %
EOS PCT: 3 %
Eosinophils Absolute: 207 cells/uL (ref 15–500)
HCT: 42.1 % (ref 38.5–50.0)
Hemoglobin: 14 g/dL (ref 13.2–17.1)
LYMPHS ABS: 1725 {cells}/uL (ref 850–3900)
Lymphocytes Relative: 25 %
MCH: 29.4 pg (ref 27.0–33.0)
MCHC: 33.3 g/dL (ref 32.0–36.0)
MCV: 88.4 fL (ref 80.0–100.0)
MONOS PCT: 11 %
MPV: 9.7 fL (ref 7.5–12.5)
Monocytes Absolute: 759 cells/uL (ref 200–950)
NEUTROS ABS: 4209 {cells}/uL (ref 1500–7800)
Neutrophils Relative %: 61 %
PLATELETS: 250 10*3/uL (ref 140–400)
RBC: 4.76 MIL/uL (ref 4.20–5.80)
RDW: 13.9 % (ref 11.0–15.0)
WBC: 6.9 10*3/uL (ref 4.0–10.5)

## 2016-08-20 LAB — POCT GLYCOSYLATED HEMOGLOBIN (HGB A1C): HEMOGLOBIN A1C: 5.8

## 2016-08-20 MED ORDER — SIMVASTATIN 40 MG PO TABS: 40.0000 mg | ORAL_TABLET | Freq: Every day | ORAL | 1 refills | Status: DC

## 2016-08-20 MED ORDER — ZOLPIDEM TARTRATE 5 MG PO TABS: ORAL_TABLET | ORAL | 1 refills | Status: DC

## 2016-08-20 MED ORDER — SILDENAFIL CITRATE 100 MG PO TABS: 50.0000 mg | ORAL_TABLET | Freq: Every day | ORAL | 11 refills | Status: DC | PRN

## 2016-08-20 NOTE — Patient Instructions (Addendum)
HEALTH MAINTENANCE RECOMMENDATIONS:  It is recommended that you get at least 30 minutes of aerobic exercise at least 5 days/week (for weight loss, you may need as much as 60-90 minutes). This can be any activity that gets your heart rate up. This can be divided in 10-15 minute intervals if needed, but try and build up your endurance at least once a week.  Weight bearing exercise is also recommended twice weekly.  Eat a healthy diet with lots of vegetables, fruits and fiber.  "Colorful" foods have a lot of vitamins (ie green vegetables, tomatoes, red peppers, etc).  Limit sweet tea, regular sodas and alcoholic beverages, all of which has a lot of calories and sugar.  Up to 2 alcoholic drinks daily may be beneficial for men (unless trying to lose weight, watch sugars).  Drink a lot of water.  Sunscreen of at least SPF 30 should be used on all sun-exposed parts of the skin when outside between the hours of 10 am and 4 pm (not just when at beach or pool, but even with exercise, golf, tennis, and yard work!)  Use a sunscreen that says "broad spectrum" so it covers both UVA and UVB rays, and make sure to reapply every 1-2 hours.  Remember to change the batteries in your smoke detectors when changing your clock times in the spring and fall.  Use your seat belt every time you are in a car, and please drive safely and not be distracted with cell phones and texting while driving.   Eric Simmons , Thank you for taking time to come for your Medicare Wellness Visit. I appreciate your ongoing commitment to your health goals. Please review the following plan we discussed and let me know if I can assist you in the future.   These are the goals we discussed: Goals    None      This is a list of the screening recommended for you and due dates:  Health Maintenance  Topic Date Due  . Flu Shot  05/29/2016  . Tetanus Vaccine  03/01/2021  . Shingles Vaccine  Completed  . Pneumonia vaccines  Completed   You got  your flu shot today.  Please call Dr. Celesta Aver office to check and see if colonoscopy is due. If he feels like you are "average risk", you might be a candidate for Cologard (fecal DNA testing--if this test is abnormal, then a colonoscopy is definitely warranted).  Please get [place] copies of your Living will and healthcare Power of Attorney at your convenience.  You can try Saw Palmetto to see if helpful with the frequent urination at night. If not, let me know and we can start a medication (such as tamsulosin, prescription)   Benign Prostatic Hypertrophy The prostate gland is part of the reproductive system of men. A normal prostate is about the size and shape of a walnut. The prostate gland produces a fluid that is mixed with sperm to make semen. This gland surrounds the urethra and is located in front of the rectum and just below the bladder. The bladder is where urine is stored. The urethra is the tube through which urine passes from the bladder to get out of the body. The prostate grows as a man ages. An enlarged prostate not caused by cancer is called benign prostatic hypertrophy (BPH). An enlarged prostate can press on the urethra. This can make it harder to pass urine. In the early stages of enlargement, the bladder can get by with a  narrowed urethra by forcing the urine through. If the problem gets worse, medical or surgical treatment may be required.  This condition should be followed by your health care provider. The accumulation of urine in the bladder can cause infection. Back pressure and infection can progress to bladder damage and kidney (renal) failure. If needed, your health care provider may refer you to a specialist in kidney and prostate disease (urologist). CAUSES  BPH is a common health problem in men older than 50 years. This condition is a normal part of aging. However, not all men will develop problems from this condition. If the enlargement grows away from the urethra, then  there will not be any compression of the urethra and resistance to urine flow.If the growth is toward the urethra and compresses it, you will experience difficulty urinating.  SYMPTOMS   Not able to completely empty your bladder.  Getting up often during the night to urinate.  Need to urinate frequently during the day.  Difficultly starting urine flow.  Decrease in size and strength of your urine stream.  Dribbling after urination.  Pain on urination (more common with infection).  Inability to pass urine. This needs immediate treatment.  The development of a urinary tract infection. DIAGNOSIS  These tests will help your health care provider understand your problem:  A thorough history and physical examination.  A urination history, with the number of times you urinate, the amounts of urine, the strength of the urine stream, and the feeling of emptiness or fullness after urinating.  A postvoid bladder scan that measures any amount of urine that may remain in your bladder after you finish urinating.  Digital rectal exam. In a rectal exam, your health care provider checks your prostate by putting a gloved, lubricated finger into your rectum to feel the back of your prostate gland. This exam detects the size of your gland and abnormal lumps or growths.  Exam of your urine (urinalysis).  Prostate specific antigen (PSA) screening. This is a blood test used to screen for prostate cancer.  Rectal ultrasonography. This test uses sound waves to electronically produce a picture of your prostate gland. TREATMENT  Once symptoms begin, your health care provider will monitor your condition. Of the men with this condition, one third will have symptoms that stabilize, one third will have symptoms that improve, and one third will have symptoms that progress in the first year. Mild symptoms may not need treatment. Simple observation and yearly exams may be all that is required. Medicines and  surgery are options for more severe problems. Your health care provider can help you make an informed decision for what is best. Two classes of medicines are available for relief of prostate symptoms:  Medicines that shrink the prostate. This helps relieve symptoms. These medicines take time to work, and it may be months before any improvement is seen.  Uncommon side effects include problems with sexual function.  Medicines to relax the muscle of the prostate. This also relieves the obstruction by reducing any compression on the urethra.This group of medicines work much faster than those that reduce the size of the prostate gland. Usually, one can experience improvement in days to weeks..  Side effects can include dizziness, fatigue, lightheadedness, and retrograde ejaculation (diminished volume of ejaculate). Several types of surgical treatments are available for relief of prostate symptoms:  Transurethral resection of the prostate (TURP)--In this treatment, an instrument is inserted through opening at the tip of the penis. It is used to  cut away pieces of the inner core of the prostate. The pieces are removed through the same opening of the penis. This removes the obstruction and helps get rid of the symptoms.  Transurethral incision (TUIP)--In this procedure, small cuts are made in the prostate. This lessens the prostates pressure on the urethra.  Transurethral microwave thermotherapy (TUMT)--This procedure uses microwaves to create heat. The heat destroys and removes a small amount of prostate tissue.  Transurethral needle ablation (TUNA)--This is a procedure that uses radio frequencies to do the same as TUMT.  Interstitial laser coagulation (ILC)--This is a procedure that uses a laser to do the same as TUMT and TUNA.  Transurethral electrovaporization (TUVP)--This is a procedure that uses electrodes to do the same as the procedures listed above. SEEK MEDICAL CARE IF:   You develop a  fever.  There is unexplained back pain.  Symptoms are not helped by medicines prescribed.  You develop side effects from the medicine you are taking.  Your urine becomes very dark or has a bad smell.  Your lower abdomen becomes distended and you have difficulty passing your urine. SEEK IMMEDIATE MEDICAL CARE IF:   You are suddenly unable to urinate. This is an emergency. You should be seen immediately.  There are large amounts of blood or clots in the urine.  Your urinary problems become unmanageable.  You develop lightheadedness, severe dizziness, or you feel faint.  You develop moderate to severe low back or flank pain.  You develop chills or fever.   This information is not intended to replace advice given to you by your health care provider. Make sure you discuss any questions you have with your health care provider.   Document Released: 10/15/2005 Document Revised: 10/20/2013 Document Reviewed: 04/30/2013 Elsevier Interactive Patient Education [employer].

## 2016-08-21 LAB — HEPATIC FUNCTION PANEL
ALBUMIN: 4.2 g/dL (ref 3.6–5.1)
ALK PHOS: 68 U/L (ref 40–115)
ALT: 19 U/L (ref 9–46)
AST: 25 U/L (ref 10–35)
BILIRUBIN INDIRECT: 0.6 mg/dL (ref 0.2–1.2)
BILIRUBIN TOTAL: 0.7 mg/dL (ref 0.2–1.2)
Bilirubin, Direct: 0.1 mg/dL (ref <=0.2)
Total Protein: 6.5 g/dL (ref 6.1–8.1)

## 2016-08-21 LAB — GLUCOSE, RANDOM: Glucose, Bld: 113 mg/dL — ABNORMAL HIGH (ref 65–99)

## 2016-08-21 LAB — TSH: TSH: 0.84 m[IU]/L (ref 0.40–4.50)

## 2016-08-21 LAB — LIPID PANEL
CHOLESTEROL: 150 mg/dL (ref 125–200)
HDL: 52 mg/dL (ref >=40)
LDL CALC: 81 mg/dL (ref <130)
TRIGLYCERIDES: 83 mg/dL (ref <150)
Total CHOL/HDL Ratio: 2.9 Ratio (ref <=5.0)
VLDL: 17 mg/dL (ref <30)

## 2016-08-21 LAB — VITAMIN B12: Vitamin B-12: 403 pg/mL (ref 200–1100)

## 2016-08-21 LAB — TESTOSTERONE: TESTOSTERONE: 302 ng/dL (ref 250–827)

## 2016-08-23 ENCOUNTER — Other Ambulatory Visit: Payer: Self-pay | Admitting: *Deleted

## 2016-08-23 ENCOUNTER — Telehealth: Payer: Self-pay | Admitting: *Deleted

## 2016-08-23 MED ORDER — SILDENAFIL CITRATE 20 MG PO TABS: 40.0000 mg | ORAL_TABLET | ORAL | 2 refills | Status: DC | PRN

## 2016-08-23 NOTE — Telephone Encounter (Signed)
[facility] called asking for new rx for Viagra. The 100mg  is very expensive. If we could send sildenafil 20mg  #50 2-5 tabs prn with 2 refills.

## 2016-08-23 NOTE — Telephone Encounter (Signed)
Ok to change to this rx

## 2016-09-05 DIAGNOSIS — Z85828 Personal history of other malignant neoplasm of skin: Secondary | ICD-10-CM | POA: Diagnosis not present

## 2016-09-05 DIAGNOSIS — C44311 Basal cell carcinoma of skin of nose: Secondary | ICD-10-CM | POA: Diagnosis not present

## 2016-09-12 DIAGNOSIS — Z4802 Encounter for removal of sutures: Secondary | ICD-10-CM | POA: Diagnosis not present

## 2016-12-10 DIAGNOSIS — L853 Xerosis cutis: Secondary | ICD-10-CM | POA: Diagnosis not present

## 2016-12-10 DIAGNOSIS — D225 Melanocytic nevi of trunk: Secondary | ICD-10-CM | POA: Diagnosis not present

## 2016-12-10 DIAGNOSIS — L821 Other seborrheic keratosis: Secondary | ICD-10-CM | POA: Diagnosis not present

## 2016-12-10 DIAGNOSIS — L820 Inflamed seborrheic keratosis: Secondary | ICD-10-CM | POA: Diagnosis not present

## 2016-12-10 DIAGNOSIS — D1801 Hemangioma of skin and subcutaneous tissue: Secondary | ICD-10-CM | POA: Diagnosis not present

## 2016-12-10 DIAGNOSIS — L570 Actinic keratosis: Secondary | ICD-10-CM | POA: Diagnosis not present

## 2016-12-10 DIAGNOSIS — Z85828 Personal history of other malignant neoplasm of skin: Secondary | ICD-10-CM | POA: Diagnosis not present

## 2016-12-20 DIAGNOSIS — D3132 Benign neoplasm of left choroid: Secondary | ICD-10-CM | POA: Diagnosis not present

## 2016-12-20 DIAGNOSIS — H5203 Hypermetropia, bilateral: Secondary | ICD-10-CM | POA: Diagnosis not present

## 2016-12-20 DIAGNOSIS — H2513 Age-related nuclear cataract, bilateral: Secondary | ICD-10-CM | POA: Diagnosis not present

## 2017-01-02 ENCOUNTER — Telehealth: Payer: Self-pay | Admitting: Family Medicine

## 2017-01-02 DIAGNOSIS — N529 Male erectile dysfunction, unspecified: Secondary | ICD-10-CM

## 2017-01-02 MED ORDER — SILDENAFIL CITRATE 100 MG PO TABS: 50.0000 mg | ORAL_TABLET | Freq: Every day | ORAL | 11 refills | Status: DC | PRN

## 2017-01-02 NOTE — Telephone Encounter (Signed)
[facility] for rx as requested

## 2017-01-02 NOTE — Telephone Encounter (Signed)
Pt requesting refill for #8 Viagra be faxed to [place] at [facility] in [place] @ [phone]

## 2017-01-02 NOTE — Telephone Encounter (Signed)
Done

## 2017-02-20 ENCOUNTER — Encounter: Payer: PPO | Admitting: Family Medicine

## 2017-03-07 ENCOUNTER — Encounter: Payer: PPO | Admitting: Family Medicine

## 2017-03-17 NOTE — Progress Notes (Signed)
Chief Complaint  Patient presents with  . Hyperlipidemia    nonfasting med check. No concerns.    Patient presents for routine follow up.  He denies any complaints.  He reports that the right hand numbness/tingling discussed at physical in November is much better--less often, only occasional. It only occurs at night, no longer wakes him up.  He continues to wake up 3-5x/night to void. Some intermittent hesitancy during the day only. Empties bladder well.  Only sporadic dribbling. This is unchanged from 6 months ago.  Denies hematuria, odor, dysuria. Denies any changes to his routine--continues to drink 1 glass of tea or beer per evening. At his physical we discussed trial of saw palmetto; consider tamsulosin if ineffective.  He has not tried saw palmetto yet.  Impaired fasting glucose. This has been very mild, ongoing/nonprogressive x years. He continues to exercise regularly. No polydipsia, polyuria (but has developed nocturia, as above), weight changes.  Lab Results  Component Value Date   HGBA1C 5.8 08/20/2016    Hyperlipidemia follow-up: Patient is reportedly following a low-fat, low cholesterol diet. Compliant with medications and denies medication side effects. Lab Results  Component Value Date   CHOL 150 08/20/2016   HDL 52 08/20/2016   LDLCALC 81 08/20/2016   TRIG 83 08/20/2016   CHOLHDL 2.9 08/20/2016    Insomnia--he takes only 1/4 tablet of 75m zolpidem about 6-7 days a week--this is just enough to help him get to sleep. This dose is effective, and he denies side effects or strange behaviors.  Familial tremor--not bothersome to patient, mainly just in his right hand. He doesn't pour his coffee cup as full, and hasn't had problems. Unchanged compared to previous visits, not worsening.  Erectile dysfunction: Viagra has been effective, but is expensive. [facility]   Left hip pain: Advanced OA noted on x-ray. He saw orthopedist and had a  cortisone injection that was helpful.This was 2-3 years ago, and hasn't had significant recurrence of pain. Overal, hip is doing well. He has beentaking glucosamine/chondroitin and coenzyme Q10 which helps a lot.  [place] [employer], walking 2-2.5 miles on the cushioned track, plus walking in neighborhood.   PMH, PSH, SH reviewed  Outpatient Encounter Prescriptions as of 03/18/2017  Medication Sig Note  . co-enzyme Q-10 30 MG capsule Take 30 mg by mouth daily.   .Marland Kitchenglucosamine-chondroitin 500-400 MG tablet Take 1 tablet by mouth daily.  06/22/2015: Also contains 1000IU of vitamin d3 and 424mmagnesium. Cut back to just 3/day from 4/day  . Multiple Vitamins-Minerals (CENTRUM SILVER ADULT 50+ PO) Take 1 tablet by mouth daily.   . sildenafil (REVATIO) 20 MG tablet Take 2-5 tablets (40-100 mg total) by mouth as needed.   . sildenafil (VIAGRA) 100 MG tablet Take 0.5-1 tablets (50-100 mg total) by mouth daily as needed for erectile dysfunction.   . simvastatin (ZOCOR) 40 MG tablet Take 1 tablet (40 mg total) by mouth at bedtime.   . Marland Kitchenolpidem (AMBIEN) 5 MG tablet TAKE ONE-HALF TO ONE TABLET BY MOUTH AT BEDTIME AS NEEDED FOR SLEEP   . acetaminophen (TYLENOL) 500 MG tablet Take 500 mg by mouth 2 (two) times daily. Reported on 12/28/2015 06/22/2015: Uses prn pain, not often   No facility-administered encounter medications on file as of 03/18/2017.    Allergies  Allergen Reactions  . Codeine     REACTION: violent vomiting    ROS:  No fever, chills, URI symptoms, hear/vision changes, headaches, dizziness, chest  pain, palpitations, shortness of breath, GI complaints.  Urinary/prostate symptoms as per HPI. No bleeding, bruising, rash.  Minimal hip pain.  Minimal RUE paresthesias per HPI.  Moods are good. Minimal tremor, unchanged.     PHYSICAL EXAM:  BP 132/86 (BP Location: Left Arm, Patient Position: Sitting, Cuff Size: Normal)   Pulse 60   Ht 6' 0.05" (1.83 m)   Wt 175 lb 3.2 oz (79.5 kg)    BMI 23.73 kg/m   Well developed, pleasant male in no distress, in good spirits HEENT: PERRL, EOMI, conjunctiva clear, OP clear Neck: no lymphadenopathy, thyromegaly or carotid bruit Heart: regular rate and rhythm without murmur Lungs: clear bilaterally Abdomen: soft, nontender, no organomegaly or mass Extremities: 2+ pulse, no edema.  Psych: normal mood, affect, hygiene and grooming Neuro: alert and oriented. Normal strength, gait Skin: no lesions/rash, normal turgor   Lab Results  Component Value Date   HGBA1C 5.8 03/18/2017    ASSESSMENT/PLAN:  Pure hypercholesterolemia - well controlled per last check.  Continue statin, check liver tests - Plan: Comprehensive metabolic panel  IFG (impaired fasting glucose) - stable; continue regular exercise and proper diet - Plan: HgB A1c  Benign familial tremor - mild, stable, no treatment needed at this time  Insomnia, unspecified type - well controlled with very low dose zolpidem  Benign prostatic hyperplasia with nocturia - symptomatic with nocturia. Will try saw palmetto, and if not improving, consider hytrin or flomax  Medication monitoring encounter - Plan: Comprehensive metabolic panel   shingrix recommended, risks/side effects reviewed (to get from pharmacy)  A1c, c-met   States he just recently got simvastatin, no refills needed today.  f/u 6 mos for CPE, sooner prn. Will touch base via MyChart/phone call re: prostate symptoms.   Saw palmetto is the over-the-counter/herbal medication we mentioned for prostate symptoms.  If this isn't effective in reducing the frequency you are up at night to void, then we can try a prescription medication (Flomax, hytrin).

## 2017-03-18 ENCOUNTER — Encounter: Payer: Self-pay | Admitting: Family Medicine

## 2017-03-18 ENCOUNTER — Ambulatory Visit (INDEPENDENT_AMBULATORY_CARE_PROVIDER_SITE_OTHER): Payer: PPO | Admitting: Family Medicine

## 2017-03-18 VITALS — BP 132/86 | HR 60 | Ht 72.05 in | Wt 175.2 lb

## 2017-03-18 DIAGNOSIS — G4700 Insomnia, unspecified: Secondary | ICD-10-CM

## 2017-03-18 DIAGNOSIS — Z5181 Encounter for therapeutic drug level monitoring: Secondary | ICD-10-CM

## 2017-03-18 DIAGNOSIS — R351 Nocturia: Secondary | ICD-10-CM

## 2017-03-18 DIAGNOSIS — G250 Essential tremor: Secondary | ICD-10-CM | POA: Diagnosis not present

## 2017-03-18 DIAGNOSIS — N401 Enlarged prostate with lower urinary tract symptoms: Secondary | ICD-10-CM

## 2017-03-18 DIAGNOSIS — E7800 Pure hypercholesterolemia, unspecified: Secondary | ICD-10-CM | POA: Diagnosis not present

## 2017-03-18 DIAGNOSIS — R7301 Impaired fasting glucose: Secondary | ICD-10-CM | POA: Diagnosis not present

## 2017-03-18 LAB — POCT GLYCOSYLATED HEMOGLOBIN (HGB A1C): HEMOGLOBIN A1C: 5.8

## 2017-03-18 NOTE — Patient Instructions (Signed)
  Saw palmetto is the over-the-counter/herbal medication we mentioned for prostate symptoms.  If this isn't effective in reducing the frequency you are up at night to void, then we can try a prescription medication (Flomax, hytrin).  I recommend getting the new shingles vaccine (Shingrix). You will need to check with your insurance to see if it is covered, and if covered by Medicare Part D, you need to get from the pharmacy rather than our office.  It is a series of 2 injections, spaced 2 months apart.

## 2017-03-19 ENCOUNTER — Encounter: Payer: Self-pay | Admitting: Family Medicine

## 2017-03-19 LAB — COMPREHENSIVE METABOLIC PANEL
ALK PHOS: 78 U/L (ref 40–115)
ALT: 17 U/L (ref 9–46)
AST: 21 U/L (ref 10–35)
Albumin: 4.4 g/dL (ref 3.6–5.1)
BILIRUBIN TOTAL: 0.5 mg/dL (ref 0.2–1.2)
BUN: 15 mg/dL (ref 7–25)
CO2: 25 mmol/L (ref 20–31)
CREATININE: 1.09 mg/dL (ref 0.70–1.18)
Calcium: 9.6 mg/dL (ref 8.6–10.3)
Chloride: 105 mmol/L (ref 98–110)
GLUCOSE: 89 mg/dL (ref 65–99)
POTASSIUM: 4.3 mmol/L (ref 3.5–5.3)
SODIUM: 140 mmol/L (ref 135–146)
TOTAL PROTEIN: 6.5 g/dL (ref 6.1–8.1)

## 2017-05-29 ENCOUNTER — Other Ambulatory Visit: Payer: Self-pay | Admitting: Family Medicine

## 2017-05-29 DIAGNOSIS — E7800 Pure hypercholesterolemia, unspecified: Secondary | ICD-10-CM

## 2017-05-29 DIAGNOSIS — G4700 Insomnia, unspecified: Secondary | ICD-10-CM

## 2017-05-29 NOTE — Telephone Encounter (Signed)
Is this okay to refill? 

## 2017-05-29 NOTE — Telephone Encounter (Signed)
Last filled #30 with 1 refill 07/2016. Okay to refill another #30 with 1 add'l refill. Next appointment is in January

## 2017-06-03 DIAGNOSIS — D485 Neoplasm of uncertain behavior of skin: Secondary | ICD-10-CM | POA: Diagnosis not present

## 2017-06-03 DIAGNOSIS — C44619 Basal cell carcinoma of skin of left upper limb, including shoulder: Secondary | ICD-10-CM | POA: Diagnosis not present

## 2017-06-03 DIAGNOSIS — L820 Inflamed seborrheic keratosis: Secondary | ICD-10-CM | POA: Diagnosis not present

## 2017-06-03 DIAGNOSIS — D1801 Hemangioma of skin and subcutaneous tissue: Secondary | ICD-10-CM | POA: Diagnosis not present

## 2017-06-03 DIAGNOSIS — L570 Actinic keratosis: Secondary | ICD-10-CM | POA: Diagnosis not present

## 2017-06-03 DIAGNOSIS — L821 Other seborrheic keratosis: Secondary | ICD-10-CM | POA: Diagnosis not present

## 2017-06-03 DIAGNOSIS — Z85828 Personal history of other malignant neoplasm of skin: Secondary | ICD-10-CM | POA: Diagnosis not present

## 2017-08-20 ENCOUNTER — Other Ambulatory Visit (INDEPENDENT_AMBULATORY_CARE_PROVIDER_SITE_OTHER): Payer: PPO

## 2017-08-20 DIAGNOSIS — Z23 Encounter for immunization: Secondary | ICD-10-CM

## 2017-09-04 ENCOUNTER — Other Ambulatory Visit: Payer: Self-pay | Admitting: Family Medicine

## 2017-09-05 DIAGNOSIS — Z85828 Personal history of other malignant neoplasm of skin: Secondary | ICD-10-CM | POA: Diagnosis not present

## 2017-09-05 DIAGNOSIS — L820 Inflamed seborrheic keratosis: Secondary | ICD-10-CM | POA: Diagnosis not present

## 2017-09-05 DIAGNOSIS — L821 Other seborrheic keratosis: Secondary | ICD-10-CM | POA: Diagnosis not present

## 2017-09-05 NOTE — Telephone Encounter (Signed)
Is this okay to refill? 

## 2017-10-27 NOTE — Progress Notes (Signed)
Chief Complaint  Patient presents with  . Medicare Wellness    fasting AWV/CPE. Has 2 concerns today. One is about side effects of sildenafil-he read something somewhere. And also about the [place]. He will be having eye exam next month.    Eric Simmons. is a 76 y.o. male who presents for annual physical, Medicare wellness visit and follow-up on chronic medical conditions.   He continues to wake up 3-5x/night to void (usually 3-4, only twice last night). Some intermittent hesitancy during the day only, only occasionally. Empties bladder well.  Only sporadic dribbling.  Denies hematuria, odor, dysuria. Denies any changes to his routine--continues to drink 1 glass of tea or beer or wine per evening. We previously discussed trial of saw palmetto; consider tamsulosin if ineffective.  He has not yet tried this.  Impaired fasting glucose. This has been very mild, ongoing/nonprogressive x years. He continues to exercise regularly. No polydipsia, polyuria (but has developed nocturia, as above), weight changes. Lab Results  Component Value Date   HGBA1C 5.8 03/18/2017   Hyperlipidemia follow-up: Patient is reportedly following a low-fat, low cholesterol diet. Compliant with medications and denies medication side effects. Lab Results  Component Value Date   CHOL 150 08/20/2016   HDL 52 08/20/2016   LDLCALC 81 08/20/2016   TRIG 83 08/20/2016   CHOLHDL 2.9 08/20/2016   Insomnia--he takes only 1/4 tablet of 5mg  zolpidem about 6-7 days a week--this is just enough to help him get to sleep. This dose is effective, and he denies side effects or strange behaviors.  He is concerned about the fact that he takes this, but he truly takes 1/4 tablet, and sometimes even smaller.  Familial tremor--not bothersome to patient, mainly just in his right hand. He doesn't pour his coffee cup as full, and hasn't had problems. Unchanged compared to previous visits, not worsening. More noticeable when using his  hands a lot, with yard tools.  No change, mild and very tolerable.  Erectile dysfunction: Changed to generic sildenafil from Viagra for cost purposes.  60mg  seems to be effective.  Some trouble ejaculating; libido is okay.  No significant fatigue. He had borderline low testosterone last year.  H/o Left hip pain: Advanced OA noted on x-ray. He saw orthopedist and had a cortisone injection that was helpful.This was 4 years ago, and hasn't had significant recurrence of pain, just an occasional twinge of pain. Overall, hip is doing well. He has beentaking glucosamine/chondroitin and coenzyme Q10 which helps a lot.  Immunization History  Administered Date(s) Administered  . Influenza Split 07/18/2011, 08/28/2012  . Influenza, High Dose Seasonal PF 08/19/2014, 06/22/2015, 08/20/2016, 08/20/2017  . Influenza,inj,Quad PF,6+ Mos 07/06/2013  . Pneumococcal Conjugate-13 06/14/2014  . Pneumococcal Polysaccharide-23 12/13/2011  . Tdap 03/02/2011  . Zoster 03/25/2014   Last colonoscopy: 05/2010; had been on a q5y schedule, so likely past due (+family history colon cancer, personal h/o polyps)--He admits that he never contacted Dr. Celesta Aver office to check Last PSA: Lab Results  Component Value Date   PSA 1.73 12/27/2015   PSA 1.87 12/06/2014   PSA 1.50 11/30/2013   Dentist: twice yearly  Ophtho: yearly, scheduled in February Exercise: Goes to [facility] 3x/week, walking 2 miles on the cushioned track, and weight-bearing exercise; plus walking in neighborhood.   Other doctors caring for patient include: Ophtho--changed doctors, can't recall name Retinal specialist (cannot recall name, on [address] Gastroenterologist: Dr. [name]  Dermatologist: Dr. Jarome Matin (2x/year) Dentist: Dr. Lonia Chimera  Ortho: Kathleen Argue  Ortho (Dr. Mina Marble gave the hip injection).  Depression screen: negative Fall screen: negative Functional Status Survey: Notable for some issues with his vision/glasses  (bifocals, has upcoming appointment with eye doctor).  Short term memory isn't as good as it used to be, some trouble with names mostly. No significant change from last year. Some leakage of urine sporadically, at nighttime, small drop.   See full questionnaires in epic.   End of Life Discussion: Patient hasa living will and medical power of attorney  Past Medical History:  Diagnosis Date  . Adenomatous colon polyp 6/06   tubular adenoma, Dr. [name]  . BCC (basal cell carcinoma), face 03/2006   left face, R ear (04/2014), nose 05/2016  . Erectile dysfunction   . Hemorrhoids    internal and external  . Hip arthritis    left  . Impaired fasting glucose    (since his 20's; had yearly GTT's for 5 yrs, never progressed)  . Insomnia   . Pseudogout of knee 10/04   left  . Pure hypercholesterolemia     Past Surgical History:  Procedure Laterality Date  . COLONOSCOPY  6/06, 05/2010   Dr. [name]  . MOHS SURGERY  6/07   L face, Dr. Link Snuffer  . TONSILLECTOMY    . VASECTOMY      Social History   Socioeconomic History  . Marital status: Widowed    Spouse name: Not on file  . Number of children: 2  . Years of education: Not on file  . Highest education level: Not on file  Social Needs  . Financial resource strain: Not on file  . Food insecurity - worry: Not on file  . Food insecurity - inability: Not on file  . Transportation needs - medical: Not on file  . Transportation needs - non-medical: Not on file  Occupational History  . Occupation: retired  Tobacco Use  . Smoking status: Never Smoker  . Smokeless tobacco: Never Used  Substance and Sexual Activity  . Alcohol use: Yes    Alcohol/week: 0.0 oz    Comment: 1 light beer or wine, once daily  . Drug use: No  . Sexual activity: Yes  Other Topics Concern  . Not on file  Social History Narrative   Widowed 2013 (after wife's long battle with MS).  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  Children live in [place], [place] and [place].   Married March 2016 [name])   1 dog.    Family History  Problem Relation Age of Onset  . Cancer Mother 76       colon cancer  . Diabetes Mother   . Colon cancer Mother   . Heart disease Father        MI  . Hypertension Father   . Hyperlipidemia Brother   . Heart disease Brother        septal defect  . Tremor Brother   . Hyperlipidemia Brother   . Tremor Brother   . Hyperlipidemia Brother   . Hyperlipidemia Son   . Heart disease Son        irregular heart beat--being evaluated (2015)  . Syncope episode Son        getting evaluated with tilt table  . Hyperlipidemia Son     Outpatient Encounter Medications as of 10/30/2017  Medication Sig Note  . acetaminophen (TYLENOL) 500 MG tablet Take 500 mg by mouth 2 (two) times daily. Reported on 12/28/2015 06/22/2015: Uses prn  pain, not often  . cholecalciferol (VITAMIN D) 1000 units tablet Take 1,000 Units by mouth daily.   Marland Kitchen Co-Enzyme Q-10 100 MG CAPS Take 300 mg by mouth daily.   . Glucosamine-Chondroit-Vit C-Mn (GLUCOSAMINE 1500 COMPLEX) CAPS Take 1 capsule by mouth daily.   . Multiple Vitamins-Minerals (CENTRUM SILVER ADULT 50+ PO) Take 1 tablet by mouth daily.   . sildenafil (REVATIO) 20 MG tablet TAKE 2 TO 5 TABLETS BY MOUTH AS NEEDED 10/30/2017: Takes 60mg   . simvastatin (ZOCOR) 40 MG tablet TAKE ONE TABLET AT BEDTIME   . zolpidem (AMBIEN) 5 MG tablet TAKE 1/2 TO 1 TABLET AT BEDTIME AS NEEDED FOR SLEEP 10/30/2017: Takes 1/4 tablet most nights (or less)  . [DISCONTINUED] co-enzyme Q-10 30 MG capsule Take 30 mg by mouth daily.   . [DISCONTINUED] glucosamine-chondroitin 500-400 MG tablet Take 1 tablet by mouth daily.  06/22/2015: Also contains 1000IU of vitamin d3 and 40mg  magnesium. Cut back to just 3/day from 4/day  . [DISCONTINUED] sildenafil (VIAGRA) 100 MG tablet Take 0.5-1 tablets (50-100 mg total) by mouth daily as needed for erectile dysfunction.    No facility-administered  encounter medications on file as of 10/30/2017.     Allergies  Allergen Reactions  . Codeine     REACTION: violent vomiting    ROS: The patient denies anorexia, fever, weight changes, headaches, decreased hearing, ear pain, hoarseness, chest pain, palpitations, dizziness, syncope, dyspnea on exertion, cough, swelling, nausea, vomiting, diarrhea, abdominal pain, melena, hematochezia, indigestion/heartburn, hematuria, weakened urine stream, dysuria, genital lesions, joint pains, weakness, suspicious skin lesions, depression, anxiety, abnormal bleeding/bruising, or enlarged lymph nodes  +insomnia, controlled with low dose ambien. Occasional slight urinary hesitancy; nocturia as per HPI. +ED, controlled with sildenafil No further tingling in his hand since he started taking B12 supplement. Tremor remains very mild, in R hand. Occasional twinge of pain in left hip, no other joint pains.   PHYSICAL EXAM:  BP 132/82   Pulse 68   Ht 6\' 1"  (1.854 m)   Wt 176 lb 9.6 oz (80.1 kg)   BMI 23.30 kg/m   Wt Readings from Last 3 Encounters:  10/30/17 176 lb 9.6 oz (80.1 kg)  03/18/17 175 lb 3.2 oz (79.5 kg)  08/20/16 173 lb 6.4 oz (78.7 kg)    General Appearance:  Alert, cooperative, no distress, appears stated age   Head:  Normocephalic, without obvious abnormality, atraumatic   Eyes:  PERRL, conjunctiva/corneas clear, EOM's intact, fundi benign. Brown pigmentation to upper portion of R eye (rest is blue)   Ears:  Normal TM's and external ear canals   Nose:  Nares normal, mucosa normal, no drainage or sinus tenderness   Throat:  Lips, mucosa, and tongue normal; teeth and gums normal   Neck:  Supple, no lymphadenopathy; thyroid: no enlargement/tenderness/nodules; no carotid bruit or JVD. C-spine nontender  Back:  Spine nontender, no curvature, ROM normal, no CVA tenderness   Lungs:  Clear to auscultation bilaterally without wheezes, rales or ronchi; respirations unlabored    Chest Wall:  No tenderness or deformity   Heart:  Regular rate and rhythm, S1 and S2 normal, no murmur, rub or gallop   Breast Exam:  No chest wall tenderness, masses or gynecomastia   Abdomen:  Soft, non-tender, nondistended, normoactive bowel sounds, no masses, no hepatosplenomegaly   Genitalia:  Normal male external genitalia without lesions. Testicles without masses. No inguinal hernias.  Rectal:  Normal sphincter tone, no masses or tenderness; guaiac negative stool. Prostate smooth, no nodules, minimally  enlarged. nonboggy, nontender.  Extremities:  No clubbing, cyanosis or edema.   Pulses:  2+ and symmetric all extremities   Skin:  Skin color, turgor normal, no rashes or lesions. Skin is very dry, especially on legs.   Lymph nodes:  Cervical, supraclavicular, and axillary nodes normal   Neurologic:  CNII-XII intact, normal strength, sensation and gait; reflexes 2+ and symmetric throughout. Very mild, fine resting tremor of RUE  Psych: Normal mood, affect, hygiene and grooming     ASSESSMENT/PLAN:  Annual physical exam - Plan: POCT Urinalysis DIP (Proadvantage Device), Lipid panel, Comprehensive metabolic panel, CBC with Differential/Platelet, TSH, Hemoglobin A1c, PSA  IFG (impaired fasting glucose) - Plan: HgB A1c, Comprehensive metabolic panel, Hemoglobin A1c  Pure hypercholesterolemia - Plan: Lipid panel, Comprehensive metabolic panel  Benign familial tremor - mild, stable - Plan: TSH  Benign prostatic hyperplasia with nocturia - fairly normal prostate size, some urinary symptoms/nocturia. Will try Saw Palmetto - Plan: PSA  Erectile dysfunction, unspecified erectile dysfunction type - controlled with sildenafil; questions answered  Medication monitoring encounter - Plan: Lipid panel, Comprehensive metabolic panel, CBC with Differential/Platelet  Insomnia, unspecified type - risks of zolpidem reviewed, discussed belsomra as safer alternative.  However he is on minimal dose, likely fine to continue  Screening for prostate cancer - risks/benefits of screening reviewed, I did NOT recommend, but he preferred to have rechecked today - Plan: PSA  Medicare annual wellness visit, subsequent    Testosterone 302 in 07/2016 Normal B12 last year  Insomnia--discussed Belsomra vs other herbal/natural alternative for sleep. Discussed safety of ambien in detail. He is on 1.5 or less mg daily.  Recommended at least 30 minutes of aerobic activity at least 5 days/week; proper sunscreen use reviewed; healthy diet and alcohol recommendations (less than or equal to 2 drinks/day) reviewed; regular seatbelt use; changing batteries in smoke detectors. Self-testicular exams. Immunization recommendations discussed--continue high dose flu shots yearly. Shingrix recommended. Colonoscopy recommendations reviewed--likely past due (q5 yrs bc h/o polyps and family history colon cancer in his mother).  Pt to check with Dr. [name]. Briefly discussed Cologard (however if having polyps, and low risk for procedure, might prefer colonoscopy at this point)  AWV--MOST form reviewed and updated. Full Code, Full Care  No refills needed today.  Medicare Attestation I have personally reviewed: The patient's medical and social history Their use of alcohol, tobacco or illicit drugs Their current medications and supplements The patient's functional ability including ADLs,fall risks, home safety risks, cognitive, and hearing and visual impairment Diet and physical activities Evidence for depression or mood disorders  The patient's weight, height, and BMI have been recorded in the chart.  I have made referrals, counseling, and provided education to the patient based on review of the above and I have provided the patient with a written personalized care plan for preventive services.

## 2017-10-30 ENCOUNTER — Ambulatory Visit (INDEPENDENT_AMBULATORY_CARE_PROVIDER_SITE_OTHER): Payer: PPO | Admitting: Family Medicine

## 2017-10-30 ENCOUNTER — Encounter: Payer: Self-pay | Admitting: Family Medicine

## 2017-10-30 VITALS — BP 132/82 | HR 68 | Ht 73.0 in | Wt 176.6 lb

## 2017-10-30 DIAGNOSIS — R7301 Impaired fasting glucose: Secondary | ICD-10-CM | POA: Diagnosis not present

## 2017-10-30 DIAGNOSIS — Z5181 Encounter for therapeutic drug level monitoring: Secondary | ICD-10-CM | POA: Diagnosis not present

## 2017-10-30 DIAGNOSIS — N529 Male erectile dysfunction, unspecified: Secondary | ICD-10-CM | POA: Diagnosis not present

## 2017-10-30 DIAGNOSIS — G250 Essential tremor: Secondary | ICD-10-CM

## 2017-10-30 DIAGNOSIS — G4700 Insomnia, unspecified: Secondary | ICD-10-CM

## 2017-10-30 DIAGNOSIS — N401 Enlarged prostate with lower urinary tract symptoms: Secondary | ICD-10-CM | POA: Diagnosis not present

## 2017-10-30 DIAGNOSIS — Z0000 Encounter for general adult medical examination without abnormal findings: Secondary | ICD-10-CM

## 2017-10-30 DIAGNOSIS — R351 Nocturia: Secondary | ICD-10-CM | POA: Diagnosis not present

## 2017-10-30 DIAGNOSIS — Z125 Encounter for screening for malignant neoplasm of prostate: Secondary | ICD-10-CM

## 2017-10-30 DIAGNOSIS — E7800 Pure hypercholesterolemia, unspecified: Secondary | ICD-10-CM | POA: Diagnosis not present

## 2017-10-30 LAB — POCT URINALYSIS DIP (PROADVANTAGE DEVICE)
Bilirubin, UA: NEGATIVE
Glucose, UA: NEGATIVE mg/dL
Ketones, POC UA: NEGATIVE mg/dL
Leukocytes, UA: NEGATIVE
NITRITE UA: NEGATIVE
PH UA: 6 (ref 5.0–8.0)
PROTEIN UA: NEGATIVE mg/dL
RBC UA: NEGATIVE
SPECIFIC GRAVITY, URINE: 1.02
Urobilinogen, Ur: NEGATIVE

## 2017-10-30 LAB — POCT GLYCOSYLATED HEMOGLOBIN (HGB A1C): HEMOGLOBIN A1C: 6

## 2017-10-30 NOTE — Patient Instructions (Addendum)
  HEALTH MAINTENANCE RECOMMENDATIONS:  It is recommended that you get at least 30 minutes of aerobic exercise at least 5 days/week (for weight loss, you may need as much as 60-90 minutes). This can be any activity that gets your heart rate up. This can be divided in 10-15 minute intervals if needed, but try and build up your endurance at least once a week.  Weight bearing exercise is also recommended twice weekly.  Eat a healthy diet with lots of vegetables, fruits and fiber.  "Colorful" foods have a lot of vitamins (ie green vegetables, tomatoes, red peppers, etc).  Limit sweet tea, regular sodas and alcoholic beverages, all of which has a lot of calories and sugar.  Up to 2 alcoholic drinks daily may be beneficial for men (unless trying to lose weight, watch sugars).  Drink a lot of water.  Sunscreen of at least SPF 30 should be used on all sun-exposed parts of the skin when outside between the hours of 10 am and 4 pm (not just when at beach or pool, but even with exercise, golf, tennis, and yard work!)  Use a sunscreen that says "broad spectrum" so it covers both UVA and UVB rays, and make sure to reapply every 1-2 hours.  Remember to change the batteries in your smoke detectors when changing your clock times in the spring and fall.  Use your seat belt every time you are in a car, and please drive safely and not be distracted with cell phones and texting while driving.   Eric Simmons , Thank you for taking time to come for your Medicare Wellness Visit. I appreciate your ongoing commitment to your health goals. Please review the following plan we discussed and let me know if I can assist you in the future.   These are the goals we discussed: Goals    None      This is a list of the screening recommended for you and due dates:  Health Maintenance  Topic Date Due  . Tetanus Vaccine  03/01/2021  . Flu Shot  Completed  . Pneumonia vaccines  Completed   I recommend getting the new shingles  vaccine (Shingrix). You will need to check with your insurance to see if it is covered, and if covered by Medicare Part D, you need to get from the pharmacy rather than our office.  It is a series of 2 injections, spaced 2 months apart.  We discussed trial of Saw Palmetto for prostate symptoms. Belsomra was the name of the sleep medication we discussed.   Contact Dr. Celesta Aver office to see if you need to have colonoscopy, vs whether or not you are a candidate for Cologard testing in place of colonoscopy (with the understanding that you need colonoscopy if it is abnormal).

## 2017-10-31 ENCOUNTER — Encounter: Payer: Self-pay | Admitting: Family Medicine

## 2017-10-31 LAB — COMPREHENSIVE METABOLIC PANEL
AG RATIO: 1.8 (calc) (ref 1.0–2.5)
ALT: 19 U/L (ref 9–46)
AST: 30 U/L (ref 10–35)
Albumin: 4.7 g/dL (ref 3.6–5.1)
Alkaline phosphatase (APISO): 80 U/L (ref 40–115)
BUN: 15 mg/dL (ref 7–25)
CHLORIDE: 103 mmol/L (ref 98–110)
CO2: 24 mmol/L (ref 20–32)
Calcium: 9.8 mg/dL (ref 8.6–10.3)
Creat: 1.11 mg/dL (ref 0.70–1.18)
Globulin: 2.6 g/dL (calc) (ref 1.9–3.7)
Glucose, Bld: 87 mg/dL (ref 65–99)
POTASSIUM: 4.5 mmol/L (ref 3.5–5.3)
Sodium: 137 mmol/L (ref 135–146)
TOTAL PROTEIN: 7.3 g/dL (ref 6.1–8.1)
Total Bilirubin: 0.7 mg/dL (ref 0.2–1.2)

## 2017-10-31 LAB — HEMOGLOBIN A1C
EAG (MMOL/L): 6.6 (calc)
HEMOGLOBIN A1C: 5.8 %{Hb} — AB (ref <5.7)
Mean Plasma Glucose: 120 (calc)

## 2017-10-31 LAB — CBC WITH DIFFERENTIAL/PLATELET
BASOS ABS: 82 {cells}/uL (ref 0–200)
Basophils Relative: 1.2 %
EOS PCT: 3.5 %
Eosinophils Absolute: 238 cells/uL (ref 15–500)
HCT: 45.9 % (ref 38.5–50.0)
Hemoglobin: 15.5 g/dL (ref 13.2–17.1)
Lymphs Abs: 1775 cells/uL (ref 850–3900)
MCH: 29.4 pg (ref 27.0–33.0)
MCHC: 33.8 g/dL (ref 32.0–36.0)
MCV: 87.1 fL (ref 80.0–100.0)
MONOS PCT: 11 %
MPV: 9.8 fL (ref 7.5–12.5)
NEUTROS PCT: 58.2 %
Neutro Abs: 3958 cells/uL (ref 1500–7800)
PLATELETS: 241 10*3/uL (ref 140–400)
RBC: 5.27 10*6/uL (ref 4.20–5.80)
RDW: 12.6 % (ref 11.0–15.0)
TOTAL LYMPHOCYTE: 26.1 %
WBC mixed population: 748 cells/uL (ref 200–950)
WBC: 6.8 10*3/uL (ref 3.8–10.8)

## 2017-10-31 LAB — TSH: TSH: 0.98 m[IU]/L (ref 0.40–4.50)

## 2017-10-31 LAB — LIPID PANEL
CHOLESTEROL: 157 mg/dL (ref <200)
HDL: 55 mg/dL (ref >40)
LDL CHOLESTEROL (CALC): 80 mg/dL
Non-HDL Cholesterol (Calc): 102 mg/dL (calc) (ref <130)
TRIGLYCERIDES: 121 mg/dL (ref <150)
Total CHOL/HDL Ratio: 2.9 (calc) (ref <5.0)

## 2017-10-31 LAB — PSA: PSA: 1.5 ng/mL (ref <=4.0)

## 2017-11-11 ENCOUNTER — Encounter: Payer: Self-pay | Admitting: Internal Medicine

## 2017-12-09 DIAGNOSIS — Z85828 Personal history of other malignant neoplasm of skin: Secondary | ICD-10-CM | POA: Diagnosis not present

## 2017-12-09 DIAGNOSIS — D1801 Hemangioma of skin and subcutaneous tissue: Secondary | ICD-10-CM | POA: Diagnosis not present

## 2017-12-09 DIAGNOSIS — L570 Actinic keratosis: Secondary | ICD-10-CM | POA: Diagnosis not present

## 2017-12-09 DIAGNOSIS — L812 Freckles: Secondary | ICD-10-CM | POA: Diagnosis not present

## 2017-12-09 DIAGNOSIS — L910 Hypertrophic scar: Secondary | ICD-10-CM | POA: Diagnosis not present

## 2017-12-09 DIAGNOSIS — L821 Other seborrheic keratosis: Secondary | ICD-10-CM | POA: Diagnosis not present

## 2017-12-21 ENCOUNTER — Other Ambulatory Visit: Payer: Self-pay | Admitting: Family Medicine

## 2017-12-21 DIAGNOSIS — E7800 Pure hypercholesterolemia, unspecified: Secondary | ICD-10-CM

## 2017-12-26 ENCOUNTER — Ambulatory Visit (AMBULATORY_SURGERY_CENTER): Payer: Self-pay | Admitting: *Deleted

## 2017-12-26 ENCOUNTER — Other Ambulatory Visit: Payer: Self-pay

## 2017-12-26 ENCOUNTER — Encounter: Payer: Self-pay | Admitting: Internal Medicine

## 2017-12-26 VITALS — Ht 72.0 in | Wt 181.0 lb

## 2017-12-26 DIAGNOSIS — Z86010 Personal history of colonic polyps: Secondary | ICD-10-CM

## 2017-12-26 NOTE — Progress Notes (Signed)
No egg or soy allergy known to patient  No issues with past sedation with any surgeries  or procedures, no intubation problems  No diet pills per patient No home 02 use per patient  No blood thinners per patient  Pt denies issues with constipation  No A fib or A flutter  EMMI video sent to pt's e mail  

## 2018-01-09 ENCOUNTER — Other Ambulatory Visit: Payer: Self-pay

## 2018-01-09 ENCOUNTER — Ambulatory Visit (AMBULATORY_SURGERY_CENTER): Payer: PPO | Admitting: Internal Medicine

## 2018-01-09 ENCOUNTER — Encounter: Payer: Self-pay | Admitting: Internal Medicine

## 2018-01-09 VITALS — BP 140/88 | HR 57 | Temp 98.6°F | Resp 11 | Ht 73.0 in | Wt 176.0 lb

## 2018-01-09 DIAGNOSIS — D125 Benign neoplasm of sigmoid colon: Secondary | ICD-10-CM | POA: Diagnosis not present

## 2018-01-09 DIAGNOSIS — D123 Benign neoplasm of transverse colon: Secondary | ICD-10-CM | POA: Diagnosis not present

## 2018-01-09 DIAGNOSIS — Z86010 Personal history of colonic polyps: Secondary | ICD-10-CM | POA: Diagnosis not present

## 2018-01-09 MED ORDER — SODIUM CHLORIDE 0.9 % IV SOLN: 500.0000 mL | Freq: Once | INTRAVENOUS | Status: DC

## 2018-01-09 NOTE — Progress Notes (Signed)
No changes in medical or surgical hx since Pv per pt

## 2018-01-09 NOTE — Op Note (Signed)
[facility] Patient Name: Eric Simmons Procedure Date: 01/09/2018 9:33 AM MRN: [id number] Endoscopist: Gatha Mayer , MD Age: 77 Referring MD:  Date of Birth: 02/16/1941 Gender: Male Account #: [id number] Procedure:                Colonoscopy Indications:              Surveillance: Personal history of adenomatous                            polyps on last colonoscopy > 5 years ago Medicines:                Propofol per Anesthesia, Monitored Anesthesia Care Procedure:                Pre-Anesthesia Assessment:                           - Prior to the procedure, a History and Physical                            was performed, and patient medications and                            allergies were reviewed. The patient's tolerance of                            previous anesthesia was also reviewed. The risks                            and benefits of the procedure and the sedation                            options and risks were discussed with the patient.                            All questions were answered, and informed consent                            was obtained. Prior Anticoagulants: The patient has                            taken no previous anticoagulant or antiplatelet                            agents. ASA Grade Assessment: I - A normal, healthy                            patient. After reviewing the risks and benefits,                            the patient was deemed in satisfactory condition to                            undergo the procedure.  After obtaining informed consent, the colonoscope                            was passed under direct vision. Throughout the                            procedure, the patient's blood pressure, pulse, and                            oxygen saturations were monitored continuously. The                            Colonoscope was introduced through the anus and                            advanced to  the the cecum, identified by                            appendiceal orifice and ileocecal valve. The                            colonoscopy was performed without difficulty. The                            patient tolerated the procedure well. The quality                            of the bowel preparation was excellent. The                            ileocecal valve, appendiceal orifice, and rectum                            were photographed. The bowel preparation used was                            Miralax. Scope In: 9:43:58 AM Scope Out: 10:00:23 AM Scope Withdrawal Time: 0 hours 14 minutes 5 seconds  Total Procedure Duration: 0 hours 16 minutes 25 seconds  Findings:                 The perianal and digital rectal examinations were                            normal. Pertinent negatives include normal prostate                            (size, shape, and consistency).                           Three sessile polyps were found in the sigmoid                            colon and transverse colon. The polyps were  diminutive in size. These polyps were removed with                            a cold snare. Resection and retrieval were                            complete. Verification of patient identification                            for the specimen was done. Estimated blood loss was                            minimal.                           A 1 mm polyp was found in the transverse colon. The                            polyp was sessile. The polyp was removed with a                            cold biopsy forceps. Resection and retrieval were                            complete. Verification of patient identification                            for the specimen was done. Estimated blood loss was                            minimal.                           The exam was otherwise without abnormality on                            direct and retroflexion  views. Complications:            No immediate complications. Estimated Blood Loss:     Estimated blood loss was minimal. Impression:               - Three diminutive polyps in the sigmoid colon and                            in the transverse colon, removed with a cold snare.                            Resected and retrieved.                           - One 1 mm polyp in the transverse colon, removed                            with a cold biopsy forceps. Resected and retrieved.                           -  The examination was otherwise normal on direct                            and retroflexion views. Recommendation:           - Patient has a contact number available for                            emergencies. The signs and symptoms of potential                            delayed complications were discussed with the                            patient. Return to normal activities tomorrow.                            Written discharge instructions were provided to the                            patient.                           - Resume previous diet.                           - Continue present medications.                           - No repeat colonoscopy due to age. Gatha Mayer, MD 01/09/2018 10:10:35 AM This report has been signed electronically.

## 2018-01-09 NOTE — Patient Instructions (Addendum)
I found and remove 4 small polyps that look benign. I will let you know pathology results by mail. I don't anticipate recommending a routine repeat colonoscopy.  I appreciate the opportunity to care for you. Gatha Mayer, MD, FACG  YOU HAD AN ENDOSCOPIC PROCEDURE TODAY AT [facility]:   Refer to the procedure report that was given to you for any specific questions about what was found during the examination.  If the procedure report does not answer your questions, please call your gastroenterologist to clarify.  If you requested that your care partner not be given the details of your procedure findings, then the procedure report has been included in a sealed envelope for you to review at your convenience later.  YOU SHOULD EXPECT: Some feelings of bloating in the abdomen. Passage of more gas than usual.  Walking can help get rid of the air that was put into your GI tract during the procedure and reduce the bloating. If you had a lower endoscopy (such as a colonoscopy or flexible sigmoidoscopy) you may notice spotting of blood in your stool or on the toilet paper. If you underwent a bowel prep for your procedure, you may not have a normal bowel movement for a few days.  Please Note:  You might notice some irritation and congestion in your nose or some drainage.  This is from the oxygen used during your procedure.  There is no need for concern and it should clear up in a day or so.  SYMPTOMS TO REPORT IMMEDIATELY:   Following lower endoscopy (colonoscopy or flexible sigmoidoscopy):  Excessive amounts of blood in the stool  Significant tenderness or worsening of abdominal pains  Swelling of the abdomen that is new, acute  Fever of 100F or higher  For urgent or emergent issues, a gastroenterologist can be reached at any hour by calling [phone].   DIET:  We do recommend a small meal at first, but then you may proceed to your regular diet.  Drink plenty of  fluids but you should avoid alcoholic beverages for 24 hours.  MEDICATIONS: Continue present medications.  Please see handouts given to you by your recovery nurse.  ACTIVITY:  You should plan to take it easy for the rest of today and you should NOT DRIVE or use heavy machinery until tomorrow (because of the sedation medicines used during the test).    FOLLOW UP: Our staff will call the number listed on your records the next business day following your procedure to check on you and address any questions or concerns that you may have regarding the information given to you following your procedure. If we do not reach you, we will leave a message.  However, if you are feeling well and you are not experiencing any problems, there is no need to return our call.  We will assume that you have returned to your regular daily activities without incident.  If any biopsies were taken you will be contacted by phone or by letter within the next 1-3 weeks.  Please call us at [phone] if you have not heard about the biopsies in 3 weeks.   Thank you for allowing [place] to provide for your healthcare needs today.  SIGNATURES/CONFIDENTIALITY: You and/or your care partner have signed paperwork which will be entered into your electronic medical record.  These signatures attest to the fact that that the information above on your After Visit Summary has been reviewed and is understood.  Full  responsibility of the confidentiality of this discharge information lies with you and/or your care-partner.

## 2018-01-09 NOTE — Progress Notes (Signed)
Report given to PACU, vss 

## 2018-01-09 NOTE — Progress Notes (Signed)
Called to room to assist during endoscopic procedure.  Patient ID and intended procedure confirmed with present staff. Received instructions for my participation in the procedure from the performing physician.  

## 2018-01-10 ENCOUNTER — Telehealth: Payer: Self-pay | Admitting: *Deleted

## 2018-01-10 NOTE — Telephone Encounter (Signed)
Opened chart in error.

## 2018-01-10 NOTE — Telephone Encounter (Signed)
  Follow up Call-  Call back number 01/09/2018  Post procedure Call Back phone  # [phone]  Permission to leave phone message Yes  Some recent data might be hidden     Patient questions:  Do you have a fever, pain , or abdominal swelling? No. Pain Score  0 *  Have you tolerated food without any problems? Yes.    Have you been able to return to your normal activities? Yes.    Do you have any questions about your discharge instructions: Diet   No. Medications  No. Follow up visit  No.  Do you have questions or concerns about your Care? No.  Actions: * If pain score is 4 or above: No action needed, pain <4.

## 2018-01-13 ENCOUNTER — Encounter: Payer: Self-pay | Admitting: Medical

## 2018-01-13 ENCOUNTER — Ambulatory Visit (INDEPENDENT_AMBULATORY_CARE_PROVIDER_SITE_OTHER): Payer: PPO | Admitting: Medical

## 2018-01-13 VITALS — BP 148/90 | HR 69 | Temp 97.8°F | Ht 72.0 in | Wt 179.0 lb

## 2018-01-13 DIAGNOSIS — L258 Unspecified contact dermatitis due to other agents: Secondary | ICD-10-CM

## 2018-01-13 MED ORDER — TRIAMCINOLONE ACETONIDE 0.1 % EX CREA: 1.0000 "application " | TOPICAL_CREAM | Freq: Two times a day (BID) | CUTANEOUS | 0 refills | Status: DC

## 2018-01-13 NOTE — Progress Notes (Signed)
Subjective:  Eric Simmons. is a 77 y.o. male who presents for rash Chief Complaint  Patient presents with  . Rash    began Thursday- on back, itchy, reddness, some burning   Started on low back Thursday.   Itchy.  No specific trigger.  He did have colonoscopy Thursday, but no other new exposure.  Can't recall the area begin touched.  Rash in vertical in low back.    Worried about shingles.  No other aggravating or relieving factors.    No other c/o.   The following portions of the patient's history were reviewed and updated as appropriate: allergies, current medications, past family history, past medical history, past social history, past surgical history and problem list.  ROS Otherwise as in subjective above  Objective: BP (!) 148/90 (BP Location: Right Arm, Patient Position: Sitting, Cuff Size: Normal)   Pulse 69   Temp 97.8 F (36.6 C) (Oral)   Ht 6' (1.829 m)   Wt 179 lb (81.2 kg)   SpO2 97%   BMI 24.28 kg/m   General appearance: alert, no distress, WD/WN Skin: patch of erythema vertically in mid low back overlying spine.  Nonspecific, generalized erythema and splotchy rash.   There are some  scattered benign seborrheic keratotic lesions along whole back.  There are no other specific vesicular or bullous or pustular lesions .    Assessment: Encounter Diagnosis  Name Primary?  . Contact dermatitis due to other agent, unspecified contact dermatitis type Yes     Plan: Symptoms and exam suggests contact dermatitis.   Begin ointment below, use normal hygiene.  If worse or not completley resolved within the next 7-10 days, then recheck.    Ashe was seen today for rash.  Diagnoses and all orders for this visit:  Contact dermatitis due to other agent, unspecified contact dermatitis type  Other orders -     triamcinolone cream (KENALOG) 0.1 %; Apply 1 application topically 2 (two) times daily.  Follow up: prn

## 2018-01-15 ENCOUNTER — Encounter: Payer: Self-pay | Admitting: Internal Medicine

## 2018-01-15 DIAGNOSIS — Z86010 Personal history of colonic polyps: Secondary | ICD-10-CM | POA: Insufficient documentation

## 2018-03-15 ENCOUNTER — Other Ambulatory Visit: Payer: Self-pay | Admitting: Family Medicine

## 2018-03-15 DIAGNOSIS — G4700 Insomnia, unspecified: Secondary | ICD-10-CM

## 2018-03-17 NOTE — Telephone Encounter (Signed)
Is this okay to refill? 

## 2018-03-31 DIAGNOSIS — M1612 Unilateral primary osteoarthritis, left hip: Secondary | ICD-10-CM | POA: Diagnosis not present

## 2018-04-08 DIAGNOSIS — M1612 Unilateral primary osteoarthritis, left hip: Secondary | ICD-10-CM | POA: Diagnosis not present

## 2018-05-06 NOTE — Progress Notes (Signed)
Chief Complaint  Patient presents with  . Hyperlipidemia    fasting med check. Would like to have TSH checked today with labs if possible due to excessive fatigue lately. And would like discuss memory issues.   . Mass    has a bump under his right arm that has been there for about a month that is itching, unsure of what it is (maybe a bug bite?) but not sure.    Patient presents accompanied by his wife for med check.  He was seen yesterday with urinary complaints, and started on cipro for possible prostatitis.  He reports doing much better.  He was only up 3x last night. Frequency during the day yesterday had started to improve, and by last night was back to normal Admits to taking only half pill last night and this morning.  Took full pill yesterday morning, no side effects, just doesn't like medicatons.  Wife reports he is tired all the time, asking for his to be thyroid checked. It was normal in January.  No other thyroid symptoms--no changes in hair/skin/bowels/temperature tolerance/weight.  She reports he doesn't drink enough water.  And recent sleep interruptions (urinary frequency).  Memory concerns: Couldn't remember where he put the newspaper yesterday after returning from walking the dog.  Doesn't seem to pay attention to and recall things his wife tells him. He had his hearing checked and it was fine.   He has been leaving the storm door open (and closing the other door instead).  Has been reminded, but still forgets to close it. She has had to tell him multiple times about closing the shower curtain so it doesn't get moldy (but he replied he does that on purpose because he is hanging the towel over the rod for the towel to dry). Wife is asking for neurology referral for further evaluation of memory concerns, wondering if additional tests are needed.  Hand tremor, felt to be familial--not bothersome to patient, mainly just in his right hand. He doesn't pour his coffee cup as full, and  hasn't had problems. Unchanged compared to previous visits, not worsening.  He feels that B12 helps with this, so has been taking B12 vitamin.  Hyperlipidemia follow-up: Patient is reportedly following a low-fat, low cholesterol diet. Compliant with medications and denies medication side effects. Lab Results  Component Value Date   CHOL 157 10/30/2017   HDL 55 10/30/2017   LDLCALC 80 10/30/2017   TRIG 121 10/30/2017   CHOLHDL 2.9 10/30/2017   Insomnia--he takes only 1/4 tablet of 69m zolpidem about 6-7days a week--this is just enough to help him get to sleep. This dose is effective, and he denies side effects or strange behaviors.  Impaired fasting glucose. This has been very mild, ongoing/nonprogressive x years. He continues to exercise regularly. No polydipsia, polyuria (but has developed nocturia, worse acutely as stated above). Lab Results  Component Value Date   HGBA1C 5.8 (H) 10/30/2017    Erectile dysfunction: Changed to generic sildenafil from Viagra for cost purposes.  687mseems to be effective.    H/o Left hip pain: Advanced OA noted on x-ray. He saw orthopedist and had a cortisone injection 4 years ago that was helpful. He had some recent recurrence of pain and had another hip injection 03/2018. He continues to take glucosamine/chondroitin and coenzyme Q10 which helps a lot.  PMH, PSH, SH reviewed  Outpatient Encounter Medications as of 05/08/2018  Medication Sig Note  . cholecalciferol (VITAMIN D) 1000 units tablet Take 1,000 Units  by mouth daily.   . ciprofloxacin (CIPRO) 500 MG tablet Take 1 tablet (500 mg total) by mouth 2 (two) times daily. 05/08/2018: Started yesterday--admits he only took 1/2 tablet last night and this morning  . Co-Enzyme Q-10 100 MG CAPS Take 300 mg by mouth daily.   . Cyanocobalamin (VITAMIN B 12 PO) Take 1 capsule by mouth daily.   . Glucosamine-Chondroit-Vit C-Mn (GLUCOSAMINE 1500 COMPLEX) CAPS Take 1 capsule by mouth daily.   .  sildenafil (REVATIO) 20 MG tablet TAKE 2 TO 5 TABLETS BY MOUTH AS NEEDED 10/30/2017: Takes 82m  . simvastatin (ZOCOR) 40 MG tablet TAKE ONE TABLET AT BEDTIME   . zolpidem (AMBIEN) 5 MG tablet TAKE 1/2 TO 1 TABLET AT BEDTIME AS NEEDED FOR SLEEP 05/08/2018: Takes 1/4 tablet nightly  . acetaminophen (TYLENOL) 500 MG tablet Take 500 mg by mouth 2 (two) times daily. Reported on 12/28/2015 06/22/2015: Uses prn pain, not often  . triamcinolone cream (KENALOG) 0.1 % Apply 1 application topically 2 (two) times daily. (Patient not taking: Reported on 05/07/2018)   . vitamin E 100 UNIT capsule Take 100 Units by mouth daily.    Facility-Administered Encounter Medications as of 05/08/2018  Medication  . 0.9 %  sodium chloride infusion   Allergies  Allergen Reactions  . Codeine     REACTION: violent vomiting   ROS:  No fever, chills, URI symptoms, hearing/vision changes, headaches, dizziness, chest pain, palpitations, shortness of breath, GI complaints.  Nocturia, up 3x/night at baseline; recent increased frequency (daytime and nighttime) improved since starting cipro. Some intermittent mild hesitancy during the day only. Empties bladder well.  No bleeding, bruising, rash.  Minimal hip pain s/p injection.  Moods are good.  Tremor and memory concerns per HPI.    PHYSICAL EXAM:  BP 120/80   Pulse 72   Ht 6' (1.829 m)   Wt 172 lb 9.6 oz (78.3 kg)   BMI 23.41 kg/m   Wt Readings from Last 3 Encounters:  05/08/18 172 lb 9.6 oz (78.3 kg)  05/07/18 175 lb 9.6 oz (79.7 kg)  01/13/18 179 lb (81.2 kg)   Well developed, pleasant male in no distress, in good spirits HEENT: PERRL, EOMI, conjunctiva clear, OP clear Neck: no lymphadenopathy, thyromegaly or carotid bruit Heart: regular rate and rhythm without murmur Lungs: clear bilaterally Back: no spinal or CVA tenderness Abdomen: soft, nontender, no organomegaly or mass Extremities: 2+ pulse, no edema.  Psych: normal mood, affect, hygiene and  grooming Neuro: alert and oriented. Normal strength, gait, cranial nerves.  Very mild resting tremor R>L with hands held out.  Significant tremor noted with writing.  No pill-rolling tremor. Skin: no visible lesions/rash, normal turgor (unfortunately, axilla was not evaluated today--mentioned to nurse, but discussed many other things during OV, and didn't get to that)  Lab Results  Component Value Date   HGBA1C 6.1 (A) 05/08/2018   MMSE 29/30, missed 1 item on recall.   He had significant tremor with writing (sentence, drawing).   ASSESSMENT/PLAN:  Pure hypercholesterolemia - continue statin and lowfat, low cholesterol diet - Plan: Comprehensive metabolic panel  Insomnia, unspecified type - reviewed potential risks of sleeping meds in elderly.  Doing very well on very low dose ambien (1/4 of 531mtablet); okay to continue if needed  IFG (impaired fasting glucose) - A1c slightly higher. Reviewed proper diet, exercise - Plan: HgB A1c, Comprehensive metabolic panel  Acute prostatitis - improved with cipro--advised to take the full tablet BID for at least a full  week, and not cut dose like he did (can cut the dose if culture is + for UTI only)  Benign familial tremor - pt is interested in discussing possible treatments with neurologist (and also to speak of his memory concerns)  Memory changes - reassured of normal MMSE and no worrisome complaints. Given his significant tremor, will send to neuro, and they can discuss both.   Fatigue, unspecified type - Ddx reviewed, reviewed all January labs, incl nl CBC, TSH. Increase fluid intake, cont exercise, healthy diet. Consider rechecking testosterone if persists  Discussed memory concerns at length--which would be more concerning/alarming. Discussed routines to minimize errors/anxiety. Discussed mediterranean diet, Ddx memory concerns. Do not suspect B12 or thyroid issue given nl CBC, MCV and TSH on last check, and is currently takin B12 with no  reason for poor absorption.   Counseled re: properly taking meds for current infection. Discussed Lorrin Mais potential risks, and how the heat and poor fluid intake can affect energy.  Refer to neuro (Dr. Carles Collet)  Visit was >40 mins, more than 1/2 spent counseling. (unfortunately the skin concern mentioned to the nurse did not get addressed, and was not mentioned at yesterday's visit either).   F/u 6 mos for CPE/AWV, with labs prior. We discussed risks/benefits of PSA, normal prior results, and relatively normal (slightly enlarged, but no nodules/masses) exam done yesterday.  Will not check PSA this year. CBC, TSH, c-met, lipids, testosterone, A1c (including testosterone given fatigue, borderline low in past, ED)

## 2018-05-07 ENCOUNTER — Encounter: Payer: Self-pay | Admitting: Family Medicine

## 2018-05-07 ENCOUNTER — Ambulatory Visit (INDEPENDENT_AMBULATORY_CARE_PROVIDER_SITE_OTHER): Payer: PPO | Admitting: Family Medicine

## 2018-05-07 VITALS — BP 150/100 | HR 72 | Temp 99.1°F | Ht 72.0 in | Wt 175.6 lb

## 2018-05-07 DIAGNOSIS — R509 Fever, unspecified: Secondary | ICD-10-CM | POA: Diagnosis not present

## 2018-05-07 DIAGNOSIS — R109 Unspecified abdominal pain: Secondary | ICD-10-CM

## 2018-05-07 DIAGNOSIS — N410 Acute prostatitis: Secondary | ICD-10-CM | POA: Diagnosis not present

## 2018-05-07 DIAGNOSIS — R350 Frequency of micturition: Secondary | ICD-10-CM | POA: Diagnosis not present

## 2018-05-07 LAB — POCT URINALYSIS DIP (PROADVANTAGE DEVICE)
BILIRUBIN UA: NEGATIVE
Blood, UA: NEGATIVE
Glucose, UA: NEGATIVE mg/dL
Ketones, POC UA: NEGATIVE mg/dL
LEUKOCYTES UA: NEGATIVE
NITRITE UA: NEGATIVE
PROTEIN UA: NEGATIVE mg/dL
Specific Gravity, Urine: 1.015
Urobilinogen, Ur: NEGATIVE
pH, UA: 6 (ref 5.0–8.0)

## 2018-05-07 MED ORDER — CIPROFLOXACIN HCL 500 MG PO TABS: 500.0000 mg | ORAL_TABLET | Freq: Two times a day (BID) | ORAL | 0 refills | Status: DC

## 2018-05-07 NOTE — Progress Notes (Signed)
Subjective:     Patient ID: Eric Roux., male   DOB: January 28, 1941, 77 y.o.   MRN: [id number]  HPI  Patient presents with urinary frequency that started last night. Usually voids 3x per night and last night it was worse with 8-9x. Regular amount of urine each time. Reports mild hesitancy with start of stream. C/o mild mid lower abdominal pain that started last night but went away this morning. No excessive amount of fluid intake, caffeine or alcohol.   Temp 99.6 at home.  Has not taken any new medications, herbs or supplements.  Denies pain with urination, urgency, foul odor, or blood in urine. Denies chills, SOB, chest pain, weakness.  BP (!) 150/100   Pulse 72   Temp 99.1 F (37.3 C) (Tympanic)   Ht 6' (1.829 m)   Wt 175 lb 9.6 oz (79.7 kg)   BMI 23.82 kg/m      Review of Systems  Constitutional: Positive for fever. Negative for chills and malaise/fatigue.  HENT: Negative for congestion, ear pain and sinus pain.   Eyes: Negative.  Negative for blurred vision.  Respiratory: Negative.  Negative for cough.   Cardiovascular: Negative.  Negative for chest pain, orthopnea and leg swelling.  Gastrointestinal: Positive for abdominal pain. Negative for diarrhea, nausea and vomiting.  Genitourinary: Positive for frequency. Negative for dysuria, flank pain, hematuria and urgency.  Musculoskeletal: Negative.   Skin: Negative.   Neurological: Negative.  Negative for dizziness, tingling, weakness and headaches.  Endo/Heme/Allergies: Negative.   Psychiatric/Behavioral: Negative.        Objective:    Physical Exam  Constitutional: He is oriented to person, place, and time. He appears well-developed and well-nourished. No distress.  HENT:  Head: Normocephalic.  Right Ear: Hearing, tympanic membrane, external ear and ear canal normal.  Left Ear: Hearing, tympanic membrane, external ear and ear canal normal.  Nose: Nose normal.  Mouth/Throat: Oropharynx is clear and moist.   Tympanic Membrane pearly gray.   Eyes: Pupils are equal, round, and reactive to light. Conjunctivae and EOM are normal.  Neck: Normal range of motion. Neck supple. Carotid bruit is not present. No tracheal deviation present. No thyromegaly present.  Cardiovascular: Normal rate, regular rhythm, S1 normal, S2 normal, normal heart sounds and intact distal pulses.  Pulses:      Dorsalis pedis pulses are 2+ on the right side, and 2+ on the left side.       Posterior tibial pulses are 2+ on the right side, and 2+ on the left side.  Pulmonary/Chest: Effort normal and breath sounds normal. No respiratory distress. He has no wheezes. He has no rhonchi. He exhibits no tenderness.  Abdominal: Soft. Normal appearance. He exhibits no distension and no mass. There is no tenderness. There is no rebound and no guarding.  Genitourinary: Rectum normal and prostate normal. Prostate is not enlarged and not tender.  Genitourinary Comments: Prostate non-boggy, non-tender, and firm. Stool present, heme negative.   Musculoskeletal: Normal range of motion.  Lymphadenopathy:       Head (right side): No submental, no submandibular, no preauricular and no posterior auricular adenopathy present.       Head (left side): No submental, no submandibular, no preauricular and no posterior auricular adenopathy present.    He has no cervical adenopathy.  Neurological: He is alert and oriented to person, place, and time. He has normal reflexes. Gait normal.  Skin: Skin is warm and dry.  Psychiatric: He has a normal mood and affect. Judgment  normal.      Assessment:     Fever Urinary frequency Acute prostatitis    Plan:     Cipro 500 mg 2x/day for 2 weeks.  Send urine for culture due to fever.   Patient instructed he can likely stop Cipro after 1 week if symptoms resolve quickly within the first 2 days. Also instructed to contact office for worsening symptoms, tendon pain or side effects.

## 2018-05-07 NOTE — Patient Instructions (Signed)
Start the antibiotic today. Take it twice daily.  You are given enough for 2 weeks. If your symptoms resolve quickly, within the first 2 days, you likely can stop after 1 week of antiibiotcs, rather than completing the full 2 week course in the bottle. Contact me in a week if there is any question about what to do. If you develop side effects, tendon pain or other concerns, contact us.   Prostatitis Prostatitis is swelling or inflammation of the prostate gland. The prostate is a walnut-sized gland that is involved in the production of semen. It is located below a man's bladder, in front of the rectum. There are four types of prostatitis:  Chronic nonbacterial prostatitis. This is the most common type of prostatitis. It may be associated with a viral infection or autoimmune disorder.  Acute bacterial prostatitis. This is the least common type of prostatitis. It starts quickly and is usually associated with a bladder infection, high fever, and shaking chills. It can occur at any age.  Chronic bacterial prostatitis. This type usually results from acute bacterial prostatitis that happens repeatedly (is recurrent) or has not been treated properly. It can occur in men of any age but is most common among middle-aged men whose prostate has begun to get larger. The symptoms are not as severe as symptoms caused by acute bacterial prostatitis.  Prostatodynia or chronic pelvic pain syndrome (CPPS). This type is also called pelvic floor disorder. It is associated with increased muscular tone in the pelvis surrounding the prostate.  What are the causes? Bacterial prostatitis is caused by infection from bacteria. Chronic nonbacterial prostatitis may be caused by:  Urinary tract infections (UTIs).  Nerve damage.  A response by the body's disease-fighting system (autoimmune response).  Chemicals in the urine.  The causes of the other types of prostatitis are usually not known. What are the signs or  symptoms? Symptoms of this condition vary depending upon the type of prostatitis. If you have acute bacterial prostatitis, you may experience:  Urinary symptoms, such as: ? Painful urination. ? Burning during urination. ? Frequent and sudden urges to urinate. ? Inability to start urinating. ? A weak or interrupted stream of urine.  Vomiting.  Nausea.  Fever.  Chills.  Inability to empty the bladder completely.  Pain in the: ? Muscles or joints. ? Lower back. ? Lower abdomen.  If you have any of the other types of prostatitis, you may experience:  Urinary symptoms, such as: ? Sudden urges to urinate. ? Frequent urination. ? Difficulty starting urination. ? Weak urine stream. ? Dribbling after urination.  Discharge from the urethra. The urethra is a tube that opens at the end of the penis.  Pain in the: ? Testicles. ? Penis or tip of the penis. ? Rectum. ? Area in front of the rectum and below the scrotum (perineum).  Problems with sexual function.  Painful ejaculation.  Bloody semen.  How is this diagnosed? This condition may be diagnosed based on:  A physical and medical exam.  Your symptoms.  A urine test to check for bacteria.  An exam in which a health care provider uses a finger to feel the prostate (digital rectal exam).  A test of a sample of semen.  Blood tests.  Ultrasound.  Removal of prostate tissue to be examined under a microscope (biopsy).  Tests to check how your body handles urine (urodynamic tests).  A test to look inside your bladder or urethra (cystoscopy).  How is this treated? Treatment  for this condition depends on the type of prostatitis. Treatment may involve:  Medicines to relieve pain or inflammation.  Medicines to help relax your muscles.  Physical therapy.  Heat therapy.  Techniques to help you control certain body functions (biofeedback).  Relaxation exercises.  Antibiotic medicine, if your condition is  caused by bacteria.  Warm water baths (sitz baths). Sitz baths help with relaxing your pelvic floor muscles, which helps to relieve pressure on the prostate.  Follow these instructions at home:  Take over-the-counter and prescription medicines only as told by your health care provider.  If you were prescribed an antibiotic, take it as told by your health care provider. Do not stop taking the antibiotic even if you start to feel better.  If physical therapy, biofeedback, or relaxation exercises were prescribed, do exercises as instructed.  Take sitz baths as directed by your health care provider. For a sitz bath, sit in warm water that is deep enough to cover your hips and buttocks.  Keep all follow-up visits as told by your health care provider. This is important. Contact a health care provider if:  Your symptoms get worse.  You have a fever. Get help right away if:  You have chills.  You feel nauseous.  You vomit.  You feel light-headed or feel like you are going to faint.  You are unable to urinate.  You have blood or blood clots in your urine. This information is not intended to replace advice given to you by your health care provider. Make sure you discuss any questions you have with your health care provider. Document Released: 10/12/2000 Document Revised: 07/05/2016 Document Reviewed: 07/05/2016 Elsevier Interactive Patient Education  2017 [employer].

## 2018-05-08 ENCOUNTER — Ambulatory Visit (INDEPENDENT_AMBULATORY_CARE_PROVIDER_SITE_OTHER): Payer: PPO | Admitting: Family Medicine

## 2018-05-08 ENCOUNTER — Encounter: Payer: Self-pay | Admitting: Family Medicine

## 2018-05-08 VITALS — BP 120/80 | HR 72 | Ht 72.0 in | Wt 172.6 lb

## 2018-05-08 DIAGNOSIS — Z5181 Encounter for therapeutic drug level monitoring: Secondary | ICD-10-CM

## 2018-05-08 DIAGNOSIS — E7800 Pure hypercholesterolemia, unspecified: Secondary | ICD-10-CM

## 2018-05-08 DIAGNOSIS — R413 Other amnesia: Secondary | ICD-10-CM | POA: Diagnosis not present

## 2018-05-08 DIAGNOSIS — G250 Essential tremor: Secondary | ICD-10-CM

## 2018-05-08 DIAGNOSIS — N410 Acute prostatitis: Secondary | ICD-10-CM | POA: Diagnosis not present

## 2018-05-08 DIAGNOSIS — G4700 Insomnia, unspecified: Secondary | ICD-10-CM

## 2018-05-08 DIAGNOSIS — R5383 Other fatigue: Secondary | ICD-10-CM | POA: Diagnosis not present

## 2018-05-08 DIAGNOSIS — N529 Male erectile dysfunction, unspecified: Secondary | ICD-10-CM | POA: Diagnosis not present

## 2018-05-08 DIAGNOSIS — R7301 Impaired fasting glucose: Secondary | ICD-10-CM | POA: Diagnosis not present

## 2018-05-08 LAB — URINE CULTURE: Organism ID, Bacteria: NO GROWTH

## 2018-05-08 LAB — COMPREHENSIVE METABOLIC PANEL
ALBUMIN: 4.6 g/dL (ref 3.5–4.8)
ALK PHOS: 83 IU/L (ref 39–117)
ALT: 35 IU/L (ref 0–44)
AST: 36 IU/L (ref 0–40)
Albumin/Globulin Ratio: 2.1 (ref 1.2–2.2)
BILIRUBIN TOTAL: 0.7 mg/dL (ref 0.0–1.2)
BUN / CREAT RATIO: 10 (ref 10–24)
BUN: 14 mg/dL (ref 8–27)
CO2: 22 mmol/L (ref 20–29)
CREATININE: 1.36 mg/dL — AB (ref 0.76–1.27)
Calcium: 9.7 mg/dL (ref 8.6–10.2)
Chloride: 99 mmol/L (ref 96–106)
GFR calc non Af Amer: 49 mL/min/{1.73_m2} — ABNORMAL LOW (ref >59)
GFR, EST AFRICAN AMERICAN: 57 mL/min/{1.73_m2} — AB (ref >59)
GLOBULIN, TOTAL: 2.2 g/dL (ref 1.5–4.5)
GLUCOSE: 112 mg/dL — AB (ref 65–99)
Potassium: 4.6 mmol/L (ref 3.5–5.2)
SODIUM: 138 mmol/L (ref 134–144)
Total Protein: 6.8 g/dL (ref 6.0–8.5)

## 2018-05-08 LAB — POCT GLYCOSYLATED HEMOGLOBIN (HGB A1C): HEMOGLOBIN A1C: 6.1 % — AB (ref 4.0–5.6)

## 2018-05-12 ENCOUNTER — Encounter: Payer: Self-pay | Admitting: Neurology

## 2018-05-16 NOTE — Progress Notes (Signed)
Eric Simmons. was seen today in the movement disorders clinic for neurologic consultation at the request of [name], MD.  The consultation is for the evaluation of action tremor and memory change.  This patient is accompanied in the office by his spouse who supplements the history.  Tremor: Yes.     How long has it been going on? 3-4 years  At rest or with activation?  activation  When is it noted the most?  After being active (working in yard; trimming trees)  Fam hx of tremor?  No.  Located where?  R hand  Affected by caffeine:  No. (drinks 1 cup coffee/1 glass tea per day)  Affected by alcohol:  No. (drinks 1 beer per day or glass of wine per day)  Affected by stress:  Yes.  , a little  Affected by fatigue:  Yes.  , after physical stress  Spills soup if on spoon:  No but has to be careful  Spills glass of liquid if full:  Yes.    Affects ADL's (tying shoes, brushing teeth, etc):  No.  Tremor inducing meds:  No.  Other Specific Symptoms:  Voice: no change Sleep: sleeps well with 1/4 tablet of ambien  Vivid Dreams:  No.  Acting out dreams:  No. Wet Pillows: No. Postural symptoms:  No.  Falls?  No. Bradykinesia symptoms: no bradykinesia noted Loss of smell:  No. Loss of taste:  No. Urinary Incontinence:  No. but has nocturia x 2-4 times per night Difficulty Swallowing:  No. Handwriting, micrographia: No., just tremulous Memory changes:  Yes.       Living situation:  Pt lives with their spouse.  Biggest c/o is trouble with remembering names.   He thinks that it is worse than others his age.  Mild other word finding trouble but "my vocabulary is pretty good."  Some mild trouble with remembering conversations and wife doesn't know if not listening.  The patient does do some of  the finances in the home; he has no trouble with this.  The patient does drive.  No getting lost.   There have not been any motor vehicle accidents in the recent years.  The patient does not cook  (wife does most of it).  Retired Sales executive.  Retired x 20 years.    Recent MMSE at [facility] on 05/08/18 was 29/30   Neuroimaging of the brain has not previously been performed.   PREVIOUS MEDICATIONS: none to date (never been on tremor medications)  ALLERGIES:   Allergies  Allergen Reactions  . Codeine     REACTION: violent vomiting    CURRENT MEDICATIONS:  Outpatient Encounter Medications as of 05/20/2018  Medication Sig  . acetaminophen (TYLENOL) 500 MG tablet Take 500 mg by mouth 2 (two) times daily. Reported on 12/28/2015  . cholecalciferol (VITAMIN D) 1000 units tablet Take 1,000 Units by mouth daily.  Marland Kitchen Co-Enzyme Q-10 100 MG CAPS Take 300 mg by mouth daily.  . Cyanocobalamin (VITAMIN B 12 PO) Take 1 capsule by mouth daily.  . Glucosamine-Chondroit-Vit C-Mn (GLUCOSAMINE 1500 COMPLEX) CAPS Take 1 capsule by mouth daily.  . sildenafil (REVATIO) 20 MG tablet TAKE 2 TO 5 TABLETS BY MOUTH AS NEEDED  . simvastatin (ZOCOR) 40 MG tablet TAKE ONE TABLET AT BEDTIME  . triamcinolone cream (KENALOG) 0.1 % Apply 1 application topically 2 (two) times daily.  . vitamin E 100 UNIT capsule Take 100 Units by mouth daily.  Marland Kitchen zolpidem (AMBIEN) 5 MG tablet  TAKE 1/2 TO 1 TABLET AT BEDTIME AS NEEDED FOR SLEEP  . [DISCONTINUED] ciprofloxacin (CIPRO) 500 MG tablet Take 1 tablet (500 mg total) by mouth 2 (two) times daily.   Facility-Administered Encounter Medications as of 05/20/2018  Medication  . 0.9 %  sodium chloride infusion    PAST MEDICAL HISTORY:   Past Medical History:  Diagnosis Date  . Adenomatous colon polyp 6/06   tubular adenoma, Dr. [name]  . BCC (basal cell carcinoma), face 03/2006   left face, R ear (04/2014), nose 05/2016  . Erectile dysfunction   . Hemorrhoids    internal and external  . Hip arthritis    left  . Hx of adenomatous colonic polyps 01/15/2018  . Impaired fasting glucose    (since his 20's; had yearly GTT's for 5 yrs, never progressed)  . Insomnia   . Pseudogout  of knee 10/04   left  . Pure hypercholesterolemia     PAST SURGICAL HISTORY:   Past Surgical History:  Procedure Laterality Date  . COLONOSCOPY  6/06, 05/2010   Dr. [name]  . MOHS SURGERY  6/07   L face, Dr. Link Snuffer  . POLYPECTOMY    . TONSILLECTOMY    . VASECTOMY      SOCIAL HISTORY:   Social History   Socioeconomic History  . Marital status: Widowed    Spouse name: Not on file  . Number of children: 2  . Years of education: Not on file  . Highest education level: Not on file  Occupational History  . Occupation: retired  Scientific laboratory technician  . Financial resource strain: Not on file  . Food insecurity:    Worry: Not on file    Inability: Not on file  . Transportation needs:    Medical: Not on file    Non-medical: Not on file  Tobacco Use  . Smoking status: Former Smoker    Types: Cigars  . Smokeless tobacco: Never Used  . Tobacco comment: as a teenager  Substance and Sexual Activity  . Alcohol use: Yes    Alcohol/week: 0.0 oz    Comment: 1 light beer or wine, once daily  . Drug use: No  . Sexual activity: Yes  Lifestyle  . Physical activity:    Days per week: Not on file    Minutes per session: Not on file  . Stress: Not on file  Relationships  . Social connections:    Talks on phone: Not on file    Gets together: Not on file    Attends religious service: Not on file    Active member of club or organization: Not on file    Attends meetings of clubs or organizations: Not on file    Relationship status: Not on file  . Intimate partner violence:    Fear of current or ex partner: Not on file    Emotionally abused: Not on file    Physically abused: Not on file    Forced sexual activity: Not on file  Other Topics Concern  . Not on file  Social History Narrative   Widowed 2013 (after wife's long battle with MS).  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  Children live in [place], [place] and [place].   Married March 2016  [name])   1 dog.    FAMILY HISTORY:   Family Status  Relation Name Status  . Mother  Deceased at age 53  . Father  Deceased at age 70  .  Brother  Deceased at age 5       liver mass, internal bleeding  . Brother  Alive  . Brother  Alive  . Son [employer]  . Son  Alive  . Neg Hx  (Not Specified)    ROS:  Review of Systems  Constitutional: Negative.   HENT: Negative.   Eyes: Negative.   Respiratory: Negative.   Cardiovascular: Negative.   Gastrointestinal: Negative.   Genitourinary: Positive for frequency (nocturia).  Musculoskeletal: Negative.   Skin: Negative.   Neurological: Positive for tingling (R hand with sleeping).  Endo/Heme/Allergies: Negative.   Psychiatric/Behavioral: Negative.     PHYSICAL EXAMINATION:    VITALS:   Vitals:   05/20/18 0856  BP: 130/80  Pulse: 70  SpO2: 95%  Weight: 175 lb 2 oz (79.4 kg)  Height: 6' (1.829 m)    GEN:  The patient appears stated age and is in NAD. HEENT:  Normocephalic, atraumatic.  The mucous membranes are moist. The superficial temporal arteries are without ropiness or tenderness. CV:  RRR Lungs:  CTAB Neck/HEME:  There are no carotid bruits bilaterally.  Neurological examination:  Orientation:  Montreal Cognitive Assessment  05/20/2018  Visuospatial/ Executive (0/5) 5  Naming (0/3) 3  Attention: Read list of digits (0/2) 2  Attention: Read list of letters (0/1) 1  Attention: Serial 7 subtraction starting at 100 (0/3) 2  Language: Repeat phrase (0/2) 2  Language : Fluency (0/1) 1  Abstraction (0/2) 1  Delayed Recall (0/5) 0  Orientation (0/6) 6  Total 23  Adjusted Score (based on education) 23   Cranial nerves: There is good facial symmetry. Pupils are equal round and reactive to light bilaterally. Fundoscopic exam reveals clear margins bilaterally. Extraocular muscles are intact. The visual fields are full to confrontational testing. The speech is fluent and clear. Soft palate rises symmetrically and  there is no tongue deviation. Hearing is intact to conversational tone. Sensation: Sensation is intact to light and pinprick throughout (facial, trunk, extremities). Vibration is decreased at the bilateral big toe. There is no extinction with double simultaneous stimulation. There is no sensory dermatomal level identified. Motor: Strength is 5/5 in the bilateral upper and lower extremities.   Shoulder shrug is equal and symmetric.  There is no pronator drift. Deep tendon reflexes: Deep tendon reflexes are 2/4 at the bilateral biceps, triceps, brachioradialis, patella and achilles. Plantar responses are downgoing bilaterally.  Movement examination: Tone: There is normal tone in the bilateral upper extremities.  The tone in the lower extremities is normal.  Abnormal movements: there is tremor of the outstretched hands bilaterally, R much more so than L.  he has mild difficulty with archimedes spirals.  he has mild difficulty when asked to pour a full glass of water from one glass to another. Coordination:  There is no decremation with RAM's, with any form of RAMS, including alternating supination and pronation of the forearm, hand opening and closing, finger taps, heel taps and toe taps. Gait and Station: The patient has no difficulty arising out of a deep-seated chair without the use of the hands. The patient's stride length is normal.  Mild trouble with tandem gait.      Chemistry      Component Value Date/Time   NA 138 05/08/2018 1048   K 4.6 05/08/2018 1048   CL 99 05/08/2018 1048   CO2 22 05/08/2018 1048   BUN 14 05/08/2018 1048   CREATININE 1.36 (H) 05/08/2018 1048   CREATININE 1.11 10/30/2017 0944  Component Value Date/Time   CALCIUM 9.7 05/08/2018 1048   ALKPHOS 83 05/08/2018 1048   AST 36 05/08/2018 1048   ALT 35 05/08/2018 1048   BILITOT 0.7 05/08/2018 1048     Lab Results  Component Value Date   TSH 0.98 10/30/2017   Lab Results  Component Value Date   VITAMINB12 403  08/20/2016     ASSESSMENT/PLAN:  1.   Essential Tremor.  - We discussed nature and pathophysiology.  We discussed that this can continue to gradually get worse with time.  We discussed that some medications can worsen this, as can caffeine use.  We discussed medication therapy as well as surgical therapy.  Ultimately, the patient decided to hold off on medication.  He will let me know if he changes his mind.     2.  Memory change  -suspect that this is MCI and not dementia.  Discussed difference between MCI and dementia.  Discussed importance of safe CV exercise (he is doing that).  Discussed importance of learning new activities.  -will pursue neurocognitive testing.    -now taking daily b12  3.  He will get results of cognitive testing from Dr. Si Raider.  If has dementia, will follow back up with me.  Otherwise, he will follow up as needed.  Much greater than 50% of this visit was spent in counseling and coordinating care.  Total face to face time:  45 min  Cc:  [name], MD

## 2018-05-20 ENCOUNTER — Ambulatory Visit: Payer: PPO | Admitting: Neurology

## 2018-05-20 ENCOUNTER — Encounter: Payer: Self-pay | Admitting: Neurology

## 2018-05-20 VITALS — BP 130/80 | HR 70 | Ht 72.0 in | Wt 175.1 lb

## 2018-05-20 DIAGNOSIS — G3184 Mild cognitive impairment, so stated: Secondary | ICD-10-CM

## 2018-05-20 DIAGNOSIS — G250 Essential tremor: Secondary | ICD-10-CM | POA: Diagnosis not present

## 2018-05-20 NOTE — Patient Instructions (Signed)
Schedule neurocognitive testing with Dr. Si Raider.

## 2018-06-17 DIAGNOSIS — L578 Other skin changes due to chronic exposure to nonionizing radiation: Secondary | ICD-10-CM | POA: Diagnosis not present

## 2018-06-17 DIAGNOSIS — D1801 Hemangioma of skin and subcutaneous tissue: Secondary | ICD-10-CM | POA: Diagnosis not present

## 2018-06-17 DIAGNOSIS — L820 Inflamed seborrheic keratosis: Secondary | ICD-10-CM | POA: Diagnosis not present

## 2018-06-17 DIAGNOSIS — L570 Actinic keratosis: Secondary | ICD-10-CM | POA: Diagnosis not present

## 2018-06-17 DIAGNOSIS — Z85828 Personal history of other malignant neoplasm of skin: Secondary | ICD-10-CM | POA: Diagnosis not present

## 2018-06-17 DIAGNOSIS — L821 Other seborrheic keratosis: Secondary | ICD-10-CM | POA: Diagnosis not present

## 2018-07-25 ENCOUNTER — Encounter: Payer: Self-pay | Admitting: Family Medicine

## 2018-07-25 ENCOUNTER — Ambulatory Visit (INDEPENDENT_AMBULATORY_CARE_PROVIDER_SITE_OTHER): Payer: PPO | Admitting: Family Medicine

## 2018-07-25 VITALS — BP 140/80 | HR 64 | Temp 97.7°F | Resp 16 | Wt 174.2 lb

## 2018-07-25 DIAGNOSIS — H9312 Tinnitus, left ear: Secondary | ICD-10-CM | POA: Diagnosis not present

## 2018-07-25 DIAGNOSIS — R42 Dizziness and giddiness: Secondary | ICD-10-CM | POA: Diagnosis not present

## 2018-07-25 LAB — GLUCOSE, POCT (MANUAL RESULT ENTRY): POC Glucose: 140 mg/dl — AB (ref 70–99)

## 2018-07-25 NOTE — Progress Notes (Signed)
Subjective:    Patient ID: Miachel Simmons., male    DOB: 1941-05-19, 77 y.o.   MRN: [id number]  HPI Chief Complaint  Patient presents with  . vertigo    vertigo flare up. yesterday was really bad, doing much better today.  would like flu shot   He is here with complaints of dizziness that he describes as vertigo symptoms.  States he had severe dizziness yesterday upon awakening and until going to bed last night. States he had nausea most of yesterday and one episode of vomiting. Describes dizziness as feeling wobbly and unsteady. Dizziness seemed to be worse with certain positions but cannot recall which position bothered him most. He reports left ear tinnitus that is a "buzzing" sensation and he has a history of this. No ear fullness.   Did not fall. Denies having headache, vision changes. Reports taking Benadryl last night.  Denies fever, chills, URI symptoms, sore throat, chest pain, palpitations, shortness of breath, cough, abdominal pain, back pain, LE edema.   States today he feels at least 75% better.   States today he has normal appetite and drinking plenty of fluids.   No other concerns.   Nocturia. Usually goes 1-2 times per night.   Reviewed allergies, medications, past medical, surgical, family, and social history.   Review of Systems Pertinent positives and negatives in the history of present illness.     Objective:   Physical Exam  Constitutional: He is oriented to person, place, and time. He appears well-developed and well-nourished. He does not have a sickly appearance. No distress.  HENT:  Right Ear: Hearing, tympanic membrane and ear canal normal.  Left Ear: Hearing, tympanic membrane and ear canal normal.  Nose: Nose normal.  Mouth/Throat: Uvula is midline, oropharynx is clear and moist and mucous membranes are normal.  Eyes: Pupils are equal, round, and reactive to light. Conjunctivae, EOM and lids are normal.  Neck: Full passive range of motion  without pain. Neck supple. No JVD present. Carotid bruit is not present. No thyromegaly present.  Cardiovascular: Normal rate, regular rhythm, normal heart sounds and intact distal pulses. Exam reveals no gallop and no friction rub.  No murmur heard. No LE edema   Pulmonary/Chest: Effort normal and breath sounds normal.  Musculoskeletal: Normal range of motion.  Lymphadenopathy:    He has no cervical adenopathy.  Neurological: He is alert and oriented to person, place, and time. He has normal strength and normal reflexes. He displays tremor. No cranial nerve deficit or sensory deficit. He displays a negative Romberg sign. Gait normal. GCS eye subscore is 4. GCS verbal subscore is 5. GCS motor subscore is 6.  No facial asymmetry, no pronator drift. Normal finger to nose however he does have a chronic tremor unchanged.   Skin: Skin is warm and dry. No rash noted.  Psychiatric: He has a normal mood and affect. His speech is normal and behavior is normal. Cognition and memory are normal.   BP 140/80   Pulse 64   Temp 97.7 F (36.5 C) (Oral)   Resp 16   Wt 174 lb 3.2 oz (79 kg)   SpO2 97%   BMI 23.63 kg/m       Assessment & Plan:  Dizziness - Plan: CBC with Differential/Platelet, Comprehensive metabolic panel, TSH, POCT glucose (manual entry)  Tinnitus of left ear  BS normal since he ate a brownie before his visit.  Normal neurological exam and he reports feeling at least 75% improved today.  Suspect symptoms related to vertigo. He will hydrate. If symptoms return he will try meclizine OTC. Advised if any stroke-like symptoms he will call 911 or go to ED.  Follow up once I have lab results.

## 2018-07-25 NOTE — Patient Instructions (Addendum)
It was a pleasure seeing you today. If you develop dizziness again, as long as you have no stroke-like symptoms such as facial droop, weakness, difficulty speaking etc., then you can try over-the-counter meclizine or Bonine to see if this helps your symptoms. I recommend trying 1/2 tablet of meclizine (12.5 mg) or a whole tablet if the half tablet does not help. Be aware that this medication may cause sedation so do not drive or operate machinery if you take it.   If you do have any strokelike symptoms then you should call 911 or go straight to the emergency room.  I do not expect this to happen.  We will call you with your lab results.

## 2018-07-26 LAB — CBC WITH DIFFERENTIAL/PLATELET
BASOS: 1 %
Basophils Absolute: 0.1 10*3/uL (ref 0.0–0.2)
EOS (ABSOLUTE): 0.2 10*3/uL (ref 0.0–0.4)
EOS: 2 %
HEMATOCRIT: 46.9 % (ref 37.5–51.0)
HEMOGLOBIN: 15.4 g/dL (ref 13.0–17.7)
Immature Grans (Abs): 0 10*3/uL (ref 0.0–0.1)
Immature Granulocytes: 0 %
LYMPHS ABS: 1.8 10*3/uL (ref 0.7–3.1)
Lymphs: 22 %
MCH: 28.2 pg (ref 26.6–33.0)
MCHC: 32.8 g/dL (ref 31.5–35.7)
MCV: 86 fL (ref 79–97)
MONOCYTES: 8 %
Monocytes Absolute: 0.7 10*3/uL (ref 0.1–0.9)
NEUTROS ABS: 5.5 10*3/uL (ref 1.4–7.0)
Neutrophils: 67 %
Platelets: 248 10*3/uL (ref 150–450)
RBC: 5.46 x10E6/uL (ref 4.14–5.80)
RDW: 13.1 % (ref 12.3–15.4)
WBC: 8.3 10*3/uL (ref 3.4–10.8)

## 2018-07-26 LAB — COMPREHENSIVE METABOLIC PANEL
A/G RATIO: 2.2 (ref 1.2–2.2)
ALBUMIN: 4.7 g/dL (ref 3.5–4.8)
ALK PHOS: 83 IU/L (ref 39–117)
ALT: 20 IU/L (ref 0–44)
AST: 29 IU/L (ref 0–40)
BILIRUBIN TOTAL: 0.5 mg/dL (ref 0.0–1.2)
BUN / CREAT RATIO: 14 (ref 10–24)
BUN: 15 mg/dL (ref 8–27)
CO2: 24 mmol/L (ref 20–29)
CREATININE: 1.04 mg/dL (ref 0.76–1.27)
Calcium: 10.1 mg/dL (ref 8.6–10.2)
Chloride: 99 mmol/L (ref 96–106)
GFR calc Af Amer: 79 mL/min/{1.73_m2} (ref >59)
GFR calc non Af Amer: 68 mL/min/{1.73_m2} (ref >59)
GLOBULIN, TOTAL: 2.1 g/dL (ref 1.5–4.5)
Glucose: 112 mg/dL — ABNORMAL HIGH (ref 65–99)
POTASSIUM: 4.2 mmol/L (ref 3.5–5.2)
SODIUM: 139 mmol/L (ref 134–144)
Total Protein: 6.8 g/dL (ref 6.0–8.5)

## 2018-07-26 LAB — TSH: TSH: 0.979 u[IU]/mL (ref 0.450–4.500)

## 2018-08-16 ENCOUNTER — Other Ambulatory Visit: Payer: Self-pay | Admitting: Family Medicine

## 2018-08-16 DIAGNOSIS — E7800 Pure hypercholesterolemia, unspecified: Secondary | ICD-10-CM

## 2018-08-18 ENCOUNTER — Other Ambulatory Visit: Payer: Self-pay | Admitting: Family Medicine

## 2018-08-18 DIAGNOSIS — E7800 Pure hypercholesterolemia, unspecified: Secondary | ICD-10-CM

## 2018-08-26 ENCOUNTER — Telehealth: Payer: Self-pay | Admitting: Neurology

## 2018-08-26 NOTE — Telephone Encounter (Signed)
Dr Bailar is leaving our office to take a job closer to home. We have mailed letter to the patient letting them know we have canceled all appointments with Bailar. We thank you for your understanding  °

## 2018-09-23 ENCOUNTER — Telehealth: Payer: Self-pay | Admitting: Neurology

## 2018-09-23 NOTE — Telephone Encounter (Signed)
Left msg with pt regarding Dr. Si Raider leaving. Asked pt to contact us if wanting to proceed with the referral to a neuropsychologist. Also contacted son regarding letter. Son stated he would encourage his dad to have testing in [facility]. Son said he would leave the decision up to his dad.

## 2018-11-06 ENCOUNTER — Other Ambulatory Visit: Payer: Self-pay

## 2018-11-09 NOTE — Patient Instructions (Addendum)
HEALTH MAINTENANCE RECOMMENDATIONS:  It is recommended that you get at least 30 minutes of aerobic exercise at least 5 days/week (for weight loss, you may need as much as 60-90 minutes). This can be any activity that gets your heart rate up. This can be divided in 10-15 minute intervals if needed, but try and build up your endurance at least once a week.  Weight bearing exercise is also recommended twice weekly.  Eat a healthy diet with lots of vegetables, fruits and fiber.  "Colorful" foods have a lot of vitamins (ie green vegetables, tomatoes, red peppers, etc).  Limit sweet tea, regular sodas and alcoholic beverages, all of which has a lot of calories and sugar.  Up to 2 alcoholic drinks daily may be beneficial for men (unless trying to lose weight, watch sugars).  Drink a lot of water.  Sunscreen of at least SPF 30 should be used on all sun-exposed parts of the skin when outside between the hours of 10 am and 4 pm (not just when at beach or pool, but even with exercise, golf, tennis, and yard work!)  Use a sunscreen that says "broad spectrum" so it covers both UVA and UVB rays, and make sure to reapply every 1-2 hours.  Remember to change the batteries in your smoke detectors when changing your clock times in the spring and fall.  Use your seat belt every time you are in a car, and please drive safely and not be distracted with cell phones and texting while driving.   Mr. Aldea , Thank you for taking time to come for your Medicare Wellness Visit. I appreciate your ongoing commitment to your health goals. Please review the following plan we discussed and let me know if I can assist you in the future.   This is a list of the screening recommended for you and due dates:  Health Maintenance  Topic Date Due  . Flu Shot  05/29/2018  . Tetanus Vaccine  03/01/2021  . Pneumonia vaccines  Completed   We have no record that you got a flu shot from our office this year (last date I have is October  2018).  Double check your records to verify that you didn't get it elsewhere (check your calendar, check with your wife, possibly check your pharmacy).  If there is no record, then I recommend that you get a high dose flu shot when you return for your fasting labs.  I recommend getting the new shingles vaccine (Shingrix). You will need to check with your insurance to see if it is covered, and if covered by Medicare Part D, you need to get from the pharmacy rather than our office.  It is a series of 2 injections, spaced 2 months apart.  Your blood pressure was elevated today. Please cut back on the sodium/salt in your diet, and monitor your blood pressure regularly. Use the paper provided to keep a record.  Return in 2 months with this list of blood pressures. If it is consistently running >135-140/85-90 then medications may be needed.    Low-Sodium Eating Plan Sodium, which is an element that makes up salt, helps you maintain a healthy balance of fluids in your body. Too much sodium can increase your blood pressure and cause fluid and waste to be held in your body. Your health care provider or dietitian may recommend following this plan if you have high blood pressure (hypertension), kidney disease, liver disease, or heart failure. Eating less sodium can help lower your  blood pressure, reduce swelling, and protect your heart, liver, and kidneys. What are tips for following this plan? General guidelines  Most people on this plan should limit their sodium intake to 1,500-2,000 mg (milligrams) of sodium each day. Reading food labels   The Nutrition Facts label lists the amount of sodium in one serving of the food. If you eat more than one serving, you must multiply the listed amount of sodium by the number of servings.  Choose foods with less than 140 mg of sodium per serving.  Avoid foods with 300 mg of sodium or more per serving. Shopping  Look for lower-sodium products, often labeled as  "low-sodium" or "no salt added."  Always check the sodium content even if foods are labeled as "unsalted" or "no salt added".  Buy fresh foods. ? Avoid canned foods and premade or frozen meals. ? Avoid canned, cured, or processed meats  Buy breads that have less than 80 mg of sodium per slice. Cooking  Eat more home-cooked food and less restaurant, buffet, and fast food.  Avoid adding salt when cooking. Use salt-free seasonings or herbs instead of table salt or sea salt. Check with your health care provider or pharmacist before using salt substitutes.  Cook with plant-based oils, such as canola, sunflower, or olive oil. Meal planning  When eating at a restaurant, ask that your food be prepared with less salt or no salt, if possible.  Avoid foods that contain MSG (monosodium glutamate). MSG is sometimes added to [place] food, bouillon, and some canned foods. What foods are recommended? The items listed may not be a complete list. Talk with your dietitian about what dietary choices are best for you. Grains Low-sodium cereals, including oats, puffed wheat and rice, and shredded wheat. Low-sodium crackers. Unsalted rice. Unsalted pasta. Low-sodium bread. Whole-grain breads and whole-grain pasta. Vegetables Fresh or frozen vegetables. "No salt added" canned vegetables. "No salt added" tomato sauce and paste. Low-sodium or reduced-sodium tomato and vegetable juice. Fruits Fresh, frozen, or canned fruit. Fruit juice. Meats and other protein foods Fresh or frozen (no salt added) meat, poultry, seafood, and fish. Low-sodium canned tuna and salmon. Unsalted nuts. Dried peas, beans, and lentils without added salt. Unsalted canned beans. Eggs. Unsalted nut butters. Dairy Milk. Soy milk. Cheese that is naturally low in sodium, such as ricotta cheese, fresh mozzarella, or Swiss cheese Low-sodium or reduced-sodium cheese. Cream cheese. Yogurt. Fats and oils Unsalted butter. Unsalted margarine with  no trans fat. Vegetable oils such as canola or olive oils. Seasonings and other foods Fresh and dried herbs and spices. Salt-free seasonings. Low-sodium mustard and ketchup. Sodium-free salad dressing. Sodium-free light mayonnaise. Fresh or refrigerated horseradish. Lemon juice. Vinegar. Homemade, reduced-sodium, or low-sodium soups. Unsalted popcorn and pretzels. Low-salt or salt-free chips. What foods are not recommended? The items listed may not be a complete list. Talk with your dietitian about what dietary choices are best for you. Grains Instant hot cereals. Bread stuffing, pancake, and biscuit mixes. Croutons. Seasoned rice or pasta mixes. Noodle soup cups. Boxed or frozen macaroni and cheese. Regular salted crackers. Self-rising flour. Vegetables Sauerkraut, pickled vegetables, and relishes. Olives. [place] fries. Onion rings. Regular canned vegetables (not low-sodium or reduced-sodium). Regular canned tomato sauce and paste (not low-sodium or reduced-sodium). Regular tomato and vegetable juice (not low-sodium or reduced-sodium). Frozen vegetables in sauces. Meats and other protein foods Meat or fish that is salted, canned, smoked, spiced, or pickled. Bacon, ham, sausage, hotdogs, corned beef, chipped beef, packaged lunch meats, salt pork,  jerky, pickled herring, anchovies, regular canned tuna, sardines, salted nuts. Dairy Processed cheese and cheese spreads. Cheese curds. Blue cheese. Feta cheese. String cheese. Regular cottage cheese. Buttermilk. Canned milk. Fats and oils Salted butter. Regular margarine. Ghee. Bacon fat. Seasonings and other foods Onion salt, garlic salt, seasoned salt, table salt, and sea salt. Canned and packaged gravies. Worcestershire sauce. Tartar sauce. Barbecue sauce. Teriyaki sauce. Soy sauce, including reduced-sodium. Steak sauce. Fish sauce. Oyster sauce. Cocktail sauce. Horseradish that you find on the shelf. Regular ketchup and mustard. Meat flavorings and  tenderizers. Bouillon cubes. Hot sauce and Tabasco sauce. Premade or packaged marinades. Premade or packaged taco seasonings. Relishes. Regular salad dressings. Salsa. Potato and tortilla chips. Corn chips and puffs. Salted popcorn and pretzels. Canned or dried soups. Pizza. Frozen entrees and pot pies. Summary  Eating less sodium can help lower your blood pressure, reduce swelling, and protect your heart, liver, and kidneys.  Most people on this plan should limit their sodium intake to 1,500-2,000 mg (milligrams) of sodium each day.  Canned, boxed, and frozen foods are high in sodium. Restaurant foods, fast foods, and pizza are also very high in sodium. You also get sodium by adding salt to food.  Try to cook at home, eat more fresh fruits and vegetables, and eat less fast food, canned, processed, or prepared foods. This information is not intended to replace advice given to you by your health care provider. Make sure you discuss any questions you have with your health care provider. Document Released: 04/06/2002 Document Revised: 10/08/2016 Document Reviewed: 10/08/2016 Elsevier Interactive Patient Education  2019 [employer].

## 2018-11-09 NOTE — Progress Notes (Signed)
Chief Complaint  Patient presents with  . Medicare Wellness    nonfasting AWV. Sees eye doctor regularly. Would like to discuss his essential tremor and progression to Parkinson's. Said his particular doctor at [facility] left the practice.   . Medication Refill    would like refill.     Eric Simmons. is a 78 y.o. male who presents for annual physical exam, Medicare wellness visit and follow-up on chronic medical conditions.   Since his last visit, he saw Dr. Carles Collet for memory concerns and tremor.  He was diagnosed with essential tremor, and treatment was declined.  As far as memory, they discussed MCI vs dementia.  He was referred for neuropsych testing, but the person they referred to left the practice. He was given a list of other neuropsych testing providers ([facility], [facility] and [facility]), didn't schedule. He has a friend with a tremor who was ultimately diagnosed with Parkinson's disease, and is concerned about this.  He reports that his handwriting has gotten worse, otherwise the tremor hasn't changed much. He denies significant impairment in daily activities as long as he doesn't pour his cup full.   No significant changes to memory since his last visit. Just some ongoing short-term memory issues.  Hyperlipidemia follow-up: Patient is reportedly following a low-fat, low cholesterol diet. Compliant with medications and denies medication side effects. Previously well controlled on current regimen, due for recheck. He is not fasting today. Lab Results  Component Value Date   CHOL 157 10/30/2017   HDL 55 10/30/2017   LDLCALC 80 10/30/2017   TRIG 121 10/30/2017   CHOLHDL 2.9 10/30/2017    Insomnia--He no longer takes any zolpidem, and reports his sleep is good. He no longer has trouble falling asleep (previously needed to take 1/4 tablet of 33m zolpidem most nights to get to sleep).  Impaired fasting glucose. This has been very mild, ongoing/nonprogressive x years. He continues to  exercise regularly. No polydipsia, polyuria (just nocturia). Lab Results  Component Value Date   HGBA1C 6.1 (A) 05/08/2018   Erectile dysfunction: Changed to generic sildenafil from Viagra for cost purposes. 673mseems to be effective. Needs refill.  Nocturia: He wakes up 1-4x/nightto void (typically 2-3 times). He notes some intermittent hesitancy during the day only, only occasionally. Empties bladder well. Only sporadic dribbling. Denies hematuria, odor, dysuria.  We previously discussedtrial of saw palmetto; consider tamsulosin if ineffective. He hasn't tried these because isn't really bothered by these symptoms. No worsening over the last year.  H/o Left hip pain: Advanced OA noted on x-ray. He saw orthopedist and had a cortisone injection 4-5 years ago that was helpful. Last hip injection 03/2018.  He doesn't have pain daily, but more when very active (yardwork, lots of squatting).  He uses tylenol prn for pain.  He continues to take glucosamine/chondroitin and coenzyme Q10 which helps a lot.   Immunization History  Administered Date(s) Administered  . Influenza Split 07/18/2011, 08/28/2012  . Influenza, High Dose Seasonal PF 08/19/2014, 06/22/2015, 08/20/2016, 08/20/2017  . Influenza,inj,Quad PF,6+ Mos 07/06/2013  . Pneumococcal Conjugate-13 06/14/2014  . Pneumococcal Polysaccharide-23 12/13/2011  . Tdap 03/02/2011  . Zoster 03/25/2014  pt insists that he had his flu shot here this year (we have no record). Last colonoscopy: 12/2017 (Dr. GeBarnett Abudenomas x 4. He was told no follow up was needed (due to age) Last PSA: Lab Results  Component Value Date   PSA 1.5 10/30/2017   PSA 1.73 12/27/2015   PSA 1.87  12/06/2014   Dentist: twice yearly  Ophtho: yearly (to two doctors) Exercise: Goes to [employer] 3x/week, walking 2 miles on the cushioned track (40-45 mins), and weight-bearing exercise; plus walking in neighborhood.   Other doctors caring for patient  include: Ophtho-- [facility] specialist (cannot recall name, on [employer]) Gastroenterologist: Dr. [name]  Dermatologist: Dr. Jarome Matin (2x/year) Dentist: Dr. Lonia Chimera  Ortho: [facility] (Dr. Mina Marble gave the hip injection). Neuro: Dr. Carles Collet  Depression screen:negative Fall screen: negative Functional Status Survey: Notable for memory concerns (per HPI) Some leakage of urine sporadically, at nighttime, small drop.  Mini-Cog screen:  Normal See full questionnaires in epic.   End of Life Discussion: Patient hasa living will and medical power of attorney   Past Medical History:  Diagnosis Date  . Adenomatous colon polyp 6/06   tubular adenoma, Dr. [name]  . BCC (basal cell carcinoma), face 03/2006   left face, R ear (04/2014), nose 05/2016  . Erectile dysfunction   . Hemorrhoids    internal and external  . Hip arthritis    left  . Hx of adenomatous colonic polyps 01/15/2018  . Impaired fasting glucose    (since his 20's; had yearly GTT's for 5 yrs, never progressed)  . Insomnia   . Pseudogout of knee 10/04   left  . Pure hypercholesterolemia     Past Surgical History:  Procedure Laterality Date  . COLONOSCOPY  6/06, 05/2010   Dr. [name]  . MOHS SURGERY  6/07   L face, Dr. Link Snuffer  . POLYPECTOMY    . TONSILLECTOMY    . VASECTOMY      Social History   Socioeconomic History  . Marital status: Widowed    Spouse name: Not on file  . Number of children: 2  . Years of education: Not on file  . Highest education level: Not on file  Occupational History  . Occupation: retired  Scientific laboratory technician  . Financial resource strain: Not on file  . Food insecurity:    Worry: Not on file    Inability: Not on file  . Transportation needs:    Medical: Not on file    Non-medical: Not on file  Tobacco Use  . Smoking status: Former Smoker    Types: Cigars  . Smokeless tobacco: Never Used  . Tobacco comment: as a teenager  Substance and Sexual Activity   . Alcohol use: Yes    Alcohol/week: 0.0 standard drinks    Comment: 1 light beer or wine, once daily  . Drug use: No  . Sexual activity: Yes  Lifestyle  . Physical activity:    Days per week: Not on file    Minutes per session: Not on file  . Stress: Not on file  Relationships  . Social connections:    Talks on phone: Not on file    Gets together: Not on file    Attends religious service: Not on file    Active member of club or organization: Not on file    Attends meetings of clubs or organizations: Not on file    Relationship status: Not on file  . Intimate partner violence:    Fear of current or ex partner: Not on file    Emotionally abused: Not on file    Physically abused: Not on file    Forced sexual activity: Not on file  Other Topics Concern  . Not on file  Social History Narrative   Widowed 2013 (after wife's long  battle with MS).  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  Children live in [place], [place] and [place].   Married March 2016 [name])   1 dog.    Family History  Problem Relation Age of Onset  . Cancer Mother 41       colon cancer  . Diabetes Mother   . Colon cancer Mother   . Colon polyps Mother   . Heart disease Father        MI  . Hypertension Father   . Hyperlipidemia Brother   . Heart disease Brother        septal defect  . Tremor Brother   . Hyperlipidemia Brother   . Tremor Brother   . Hyperlipidemia Brother   . Hyperlipidemia Son   . Heart disease Son        irregular heart beat--being evaluated (2015)  . Syncope episode Son        getting evaluated with tilt table  . Hyperlipidemia Son   . Sleep apnea Son   . Esophageal cancer Neg Hx   . Stomach cancer Neg Hx   . Rectal cancer Neg Hx     Outpatient Encounter Medications as of 11/10/2018  Medication Sig Note  . cholecalciferol (VITAMIN D) 1000 units tablet Take 1,000 Units by mouth daily.   Marland Kitchen Co-Enzyme Q-10  100 MG CAPS Take 300 mg by mouth daily.   . Cyanocobalamin (VITAMIN B 12 PO) Take 1 capsule by mouth daily.   . Glucosamine-Chondroit-Vit C-Mn (GLUCOSAMINE 1500 COMPLEX) CAPS Take 1 capsule by mouth daily.   . sildenafil (REVATIO) 20 MG tablet TAKE 2 TO 5 TABLETS BY MOUTH AS NEEDED 10/30/2017: Takes 85m  . simvastatin (ZOCOR) 40 MG tablet TAKE ONE TABLET AT BEDTIME   . triamcinolone cream (KENALOG) 0.1 % Apply 1 application topically 2 (two) times daily. 11/10/2018: Uses prn rashes  . acetaminophen (TYLENOL) 500 MG tablet Take 500 mg by mouth 2 (two) times daily. Reported on 12/28/2015 06/22/2015: Uses prn pain, not often  . [DISCONTINUED] simvastatin (ZOCOR) 40 MG tablet TAKE ONE TABLET AT BEDTIME    Facility-Administered Encounter Medications as of 11/10/2018  Medication  . 0.9 %  sodium chloride infusion    Allergies  Allergen Reactions  . Codeine     REACTION: violent vomiting     ROS: The patient denies anorexia, fever, weight changes, headaches, decreased hearing, ear pain, hoarseness, chest pain, palpitations, dizziness, syncope, dyspnea on exertion, cough, swelling, nausea, vomiting, diarrhea, abdominal pain, melena, hematochezia, indigestion/heartburn, hematuria, weakened urine stream, dysuria, genital lesions, joint pains, weakness, suspicious skin lesions, depression, anxiety, abnormal bleeding/bruising, or enlarged lymph nodes  Insomnia resolved Occasional slight urinary hesitancy; nocturia as per HPI. +ED, controlled with sildenafil Tremor in right hand is stable. Left hip pain (mild), no other joint pains. Right hand sometimes falls asleep when he is sleeping (less often than prior to taking B12 supplement).    PHYSICAL EXAM:  BP (!) 144/80   Pulse 64   Ht 6' (1.829 m)   Wt 178 lb 9.6 oz (81 kg)   BMI 24.22 kg/m   Wt Readings from Last 3 Encounters:  11/10/18 178 lb 9.6 oz (81 kg)  07/25/18 174 lb 3.2 oz (79 kg)  05/20/18 175 lb 2 oz (79.4 kg)     General  Appearance:  Alert, cooperative, no distress, appears stated age   Head:  Normocephalic, without obvious abnormality, atraumatic  Eyes:  PERRL, conjunctiva/corneas clear, EOM's intact, fundibenign. Brown pigmentation to upper portion of R eye (rest is blue), unchanged   Ears:  Normal TM's and external ear canals. Prominent vessel is noted on the right TM  Nose:  Nares normal, mucosa normal, no drainage or sinus tenderness   Throat:  Lips, mucosa, and tongue normal; teeth and gums normal   Neck:  Supple, no lymphadenopathy; thyroid: no enlargement/tenderness/nodules; no carotid bruit or JVD. C-spine nontender  Back:  Spine nontender, no curvature, ROM normal, no CVA tenderness   Lungs:  Clear to auscultation bilaterally without wheezes, rales or ronchi; respirations unlabored   Chest Wall:  No tenderness or deformity   Heart:  Regular rate and rhythm, S1 and S2 normal, no murmur, rub or gallop   Breast Exam:  No chest wall tenderness, masses or gynecomastia   Abdomen:  Soft, non-tender, nondistended, normoactive bowel sounds, no masses, no hepatosplenomegaly   Genitalia:  Normal male external genitalia without lesions. Testicles without masses. No inguinal hernias.  Rectal:  Normal sphincter tone, no masses or tenderness; guaiac negative stool. Prostate smooth, no nodules, mildly enlarged,nonboggy, nontender.  Extremities:  No clubbing, cyanosis or edema.  Pulses:  2+ and symmetric all extremities   Skin:  Skin color, turgor normal, no rashes. Skin is very dry, especially on legs and back. AK on chest. SK's noted on back.  Lymph nodes:  Cervical, supraclavicular, and axillary nodes normal   Neurologic:  CNII-XII intact, normal strength, sensation and gait; reflexes 2+ and symmetric throughout. Very mild, fine resting tremor of RUE   Psych: Normal mood, affect, hygiene and grooming    ASSESSMENT/PLAN:  Annual physical exam - Plan: POCT Urinalysis  DIP (Proadvantage Device)  Medicare annual wellness visit, subsequent  Benign prostatic hyperplasia with nocturia - symptoms are tolerable declines treatment  Pure hypercholesterolemia - return for fasting labs, due  Insomnia, unspecified type - resolved. No longer needing any zolpidem  IFG (impaired fasting glucose)  Essential tremor - stable; reviewed that tremor with PD is different, not his dx from neuro Advised Dr. Carles Collet is still there, and he can f/u with questions/concerns if tx desired  MCI (mild cognitive impairment) - he brought in list of names of alternatives for neuropsych testing, discussed  Medication monitoring encounter  Erectile dysfunction, unspecified erectile dysfunction type - well controlled with sildenafil - Plan: sildenafil (REVATIO) 20 MG tablet  Elevated blood pressure reading - no h/o HTN. Counseled re: home monitoring, low Na diet. f/u in 2 mos with list of BP's    A1c (do with labs, no FS), c-met, lipids, CBC Future labs already in system, as well as testosterone, which he requested to be checked.   No further PSA screening is recommended. Recommended at least 30 minutes of aerobic activity at least 5 days/week, weight bearing exercise at least 2x/week; proper sunscreen use reviewed; healthy diet and alcohol recommendations (less than or equal to 2 drinks/day) reviewed; regular seatbelt use; changing batteries in smoke detectors. Immunization recommendations discussed--continue high dose flu shots yearly (we have no record of giving him one this year--he prefers to check his calendar to verify--If he can't verify getting it anywhere else, rec we give at [facility] when he comes for his labs). Shingrix recommended; risks/SE reviewed, to get from pharmacy. Colonoscopy is UTD.  AWV--MOST form reviewed and updated. Full Code, Full Care.   We have no record that you got a flu shot from our office this year (last date I have is October  2018).  Double check your  records to verify that you didn't get it elsewhere (check your calendar, check with your wife, possibly check your pharmacy).  If there is no record, then I recommend that you get a high dose flu shot when you return for your fasting labs.  Your blood pressure was elevated today. Please cut back on the sodium/salt in your diet, and monitor your blood pressure regularly. Use the paper provided to keep a record.  Return in 2 months with this list of blood pressures. If it is consistently running >135-140/85-90 then medications may be needed.   Medicare Attestation I have personally reviewed: The patient's medical and social history Their use of alcohol, tobacco or illicit drugs Their current medications and supplements The patient's functional ability including ADLs,fall risks, home safety risks, cognitive, and hearing and visual impairment Diet and physical activities Evidence for depression or mood disorders  The patient's weight, height and BMI have been recorded in the chart.  I have made referrals, counseling, and provided education to the patient based on review of the above and I have provided the patient with a written personalized care plan for preventive services.

## 2018-11-10 ENCOUNTER — Encounter: Payer: Self-pay | Admitting: Family Medicine

## 2018-11-10 ENCOUNTER — Ambulatory Visit (INDEPENDENT_AMBULATORY_CARE_PROVIDER_SITE_OTHER): Payer: PPO | Admitting: Family Medicine

## 2018-11-10 VITALS — BP 144/80 | HR 64 | Ht 72.0 in | Wt 178.6 lb

## 2018-11-10 DIAGNOSIS — R030 Elevated blood-pressure reading, without diagnosis of hypertension: Secondary | ICD-10-CM | POA: Diagnosis not present

## 2018-11-10 DIAGNOSIS — Z5181 Encounter for therapeutic drug level monitoring: Secondary | ICD-10-CM

## 2018-11-10 DIAGNOSIS — N529 Male erectile dysfunction, unspecified: Secondary | ICD-10-CM

## 2018-11-10 DIAGNOSIS — G3184 Mild cognitive impairment, so stated: Secondary | ICD-10-CM

## 2018-11-10 DIAGNOSIS — N401 Enlarged prostate with lower urinary tract symptoms: Secondary | ICD-10-CM | POA: Diagnosis not present

## 2018-11-10 DIAGNOSIS — G4700 Insomnia, unspecified: Secondary | ICD-10-CM

## 2018-11-10 DIAGNOSIS — Z0000 Encounter for general adult medical examination without abnormal findings: Secondary | ICD-10-CM | POA: Diagnosis not present

## 2018-11-10 DIAGNOSIS — R351 Nocturia: Secondary | ICD-10-CM | POA: Diagnosis not present

## 2018-11-10 DIAGNOSIS — R7301 Impaired fasting glucose: Secondary | ICD-10-CM

## 2018-11-10 DIAGNOSIS — E7800 Pure hypercholesterolemia, unspecified: Secondary | ICD-10-CM

## 2018-11-10 DIAGNOSIS — G250 Essential tremor: Secondary | ICD-10-CM

## 2018-11-10 LAB — POCT URINALYSIS DIP (PROADVANTAGE DEVICE)
Bilirubin, UA: NEGATIVE
Blood, UA: NEGATIVE
GLUCOSE UA: NEGATIVE mg/dL
Ketones, POC UA: NEGATIVE mg/dL
Leukocytes, UA: NEGATIVE
NITRITE UA: NEGATIVE
Protein Ur, POC: NEGATIVE mg/dL
Specific Gravity, Urine: 1.03
UUROB: NEGATIVE
pH, UA: 5.5 (ref 5.0–8.0)

## 2018-11-10 MED ORDER — SILDENAFIL CITRATE 20 MG PO TABS: ORAL_TABLET | ORAL | 2 refills | Status: DC

## 2018-11-11 ENCOUNTER — Other Ambulatory Visit: Payer: PPO

## 2018-11-11 DIAGNOSIS — Z5181 Encounter for therapeutic drug level monitoring: Secondary | ICD-10-CM | POA: Diagnosis not present

## 2018-11-11 DIAGNOSIS — R5383 Other fatigue: Secondary | ICD-10-CM | POA: Diagnosis not present

## 2018-11-11 DIAGNOSIS — E7800 Pure hypercholesterolemia, unspecified: Secondary | ICD-10-CM

## 2018-11-11 DIAGNOSIS — N529 Male erectile dysfunction, unspecified: Secondary | ICD-10-CM | POA: Diagnosis not present

## 2018-11-11 DIAGNOSIS — R7301 Impaired fasting glucose: Secondary | ICD-10-CM

## 2018-11-12 ENCOUNTER — Encounter: Payer: Self-pay | Admitting: Family Medicine

## 2018-11-12 LAB — COMPREHENSIVE METABOLIC PANEL
ALBUMIN: 4.4 g/dL (ref 3.5–4.8)
ALK PHOS: 86 IU/L (ref 39–117)
ALT: 22 IU/L (ref 0–44)
AST: 23 IU/L (ref 0–40)
Albumin/Globulin Ratio: 2.3 — ABNORMAL HIGH (ref 1.2–2.2)
BUN / CREAT RATIO: 16 (ref 10–24)
BUN: 19 mg/dL (ref 8–27)
Bilirubin Total: 0.4 mg/dL (ref 0.0–1.2)
CO2: 20 mmol/L (ref 20–29)
Calcium: 9.7 mg/dL (ref 8.6–10.2)
Chloride: 105 mmol/L (ref 96–106)
Creatinine, Ser: 1.2 mg/dL (ref 0.76–1.27)
GFR calc Af Amer: 66 mL/min/{1.73_m2} (ref >59)
GFR calc non Af Amer: 57 mL/min/{1.73_m2} — ABNORMAL LOW (ref >59)
Globulin, Total: 1.9 g/dL (ref 1.5–4.5)
Glucose: 107 mg/dL — ABNORMAL HIGH (ref 65–99)
Potassium: 4.7 mmol/L (ref 3.5–5.2)
Sodium: 140 mmol/L (ref 134–144)
Total Protein: 6.3 g/dL (ref 6.0–8.5)

## 2018-11-12 LAB — CBC WITH DIFFERENTIAL/PLATELET
Basophils Absolute: 0.1 10*3/uL (ref 0.0–0.2)
Basos: 1 %
EOS (ABSOLUTE): 0.3 10*3/uL (ref 0.0–0.4)
EOS: 4 %
HEMATOCRIT: 43.2 % (ref 37.5–51.0)
Hemoglobin: 14.7 g/dL (ref 13.0–17.7)
Immature Grans (Abs): 0 10*3/uL (ref 0.0–0.1)
Immature Granulocytes: 0 %
Lymphocytes Absolute: 1.8 10*3/uL (ref 0.7–3.1)
Lymphs: 28 %
MCH: 29.8 pg (ref 26.6–33.0)
MCHC: 34 g/dL (ref 31.5–35.7)
MCV: 88 fL (ref 79–97)
Monocytes Absolute: 0.7 10*3/uL (ref 0.1–0.9)
Monocytes: 10 %
Neutrophils Absolute: 3.6 10*3/uL (ref 1.4–7.0)
Neutrophils: 57 %
Platelets: 237 10*3/uL (ref 150–450)
RBC: 4.93 x10E6/uL (ref 4.14–5.80)
RDW: 12.8 % (ref 11.6–15.4)
WBC: 6.4 10*3/uL (ref 3.4–10.8)

## 2018-11-12 LAB — LIPID PANEL
Chol/HDL Ratio: 3.4 ratio (ref 0.0–5.0)
Cholesterol, Total: 170 mg/dL (ref 100–199)
HDL: 50 mg/dL (ref >39)
LDL Calculated: 101 mg/dL — ABNORMAL HIGH (ref 0–99)
TRIGLYCERIDES: 94 mg/dL (ref 0–149)
VLDL Cholesterol Cal: 19 mg/dL (ref 5–40)

## 2018-11-12 LAB — TSH: TSH: 1.23 u[IU]/mL (ref 0.450–4.500)

## 2018-11-12 LAB — HEMOGLOBIN A1C
Est. average glucose Bld gHb Est-mCnc: 123 mg/dL
Hgb A1c MFr Bld: 5.9 % — ABNORMAL HIGH (ref 4.8–5.6)

## 2018-11-12 LAB — TESTOSTERONE: Testosterone: 314 ng/dL (ref 264–916)

## 2018-11-12 MED ORDER — SIMVASTATIN 40 MG PO TABS: 40.0000 mg | ORAL_TABLET | Freq: Every day | ORAL | 3 refills | Status: DC

## 2018-11-12 NOTE — Addendum Note (Signed)
Addended by: [name] on: 11/12/2018 07:37 AM   Modules accepted: Orders

## 2018-11-13 ENCOUNTER — Encounter: Payer: PPO | Admitting: Psychology

## 2018-12-11 ENCOUNTER — Encounter: Payer: PPO | Admitting: Psychology

## 2018-12-18 DIAGNOSIS — L821 Other seborrheic keratosis: Secondary | ICD-10-CM | POA: Diagnosis not present

## 2018-12-18 DIAGNOSIS — Z85828 Personal history of other malignant neoplasm of skin: Secondary | ICD-10-CM | POA: Diagnosis not present

## 2018-12-18 DIAGNOSIS — L570 Actinic keratosis: Secondary | ICD-10-CM | POA: Diagnosis not present

## 2018-12-18 DIAGNOSIS — D485 Neoplasm of uncertain behavior of skin: Secondary | ICD-10-CM | POA: Diagnosis not present

## 2018-12-18 DIAGNOSIS — L853 Xerosis cutis: Secondary | ICD-10-CM | POA: Diagnosis not present

## 2018-12-18 DIAGNOSIS — C44519 Basal cell carcinoma of skin of other part of trunk: Secondary | ICD-10-CM | POA: Diagnosis not present

## 2018-12-18 DIAGNOSIS — C44612 Basal cell carcinoma of skin of right upper limb, including shoulder: Secondary | ICD-10-CM | POA: Diagnosis not present

## 2018-12-18 DIAGNOSIS — D1801 Hemangioma of skin and subcutaneous tissue: Secondary | ICD-10-CM | POA: Diagnosis not present

## 2018-12-25 ENCOUNTER — Encounter: Payer: Self-pay | Admitting: Family Medicine

## 2019-06-09 DIAGNOSIS — M1612 Unilateral primary osteoarthritis, left hip: Secondary | ICD-10-CM | POA: Diagnosis not present

## 2019-06-10 ENCOUNTER — Encounter: Payer: PPO | Admitting: Family Medicine
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
## 2019-06-18 ENCOUNTER — Ambulatory Visit: Payer: PPO | Admitting: Family Medicine

## 2019-06-18 ENCOUNTER — Ambulatory Visit: Payer: Self-pay | Admitting: Family Medicine

## 2019-06-18 DIAGNOSIS — L570 Actinic keratosis: Secondary | ICD-10-CM | POA: Diagnosis not present

## 2019-06-18 DIAGNOSIS — D1801 Hemangioma of skin and subcutaneous tissue: Secondary | ICD-10-CM | POA: Diagnosis not present

## 2019-06-18 DIAGNOSIS — L812 Freckles: Secondary | ICD-10-CM | POA: Diagnosis not present

## 2019-06-18 DIAGNOSIS — Z85828 Personal history of other malignant neoplasm of skin: Secondary | ICD-10-CM | POA: Diagnosis not present

## 2019-06-18 DIAGNOSIS — L821 Other seborrheic keratosis: Secondary | ICD-10-CM | POA: Diagnosis not present

## 2019-06-18 DIAGNOSIS — L820 Inflamed seborrheic keratosis: Secondary | ICD-10-CM | POA: Diagnosis not present

## 2019-06-19 ENCOUNTER — Other Ambulatory Visit (INDEPENDENT_AMBULATORY_CARE_PROVIDER_SITE_OTHER): Payer: PPO

## 2019-06-19 ENCOUNTER — Encounter: Payer: Self-pay | Admitting: Neurology

## 2019-06-19 ENCOUNTER — Other Ambulatory Visit: Payer: Self-pay

## 2019-06-19 ENCOUNTER — Ambulatory Visit (INDEPENDENT_AMBULATORY_CARE_PROVIDER_SITE_OTHER): Payer: PPO | Admitting: Neurology

## 2019-06-19 VITALS — BP 168/84 | HR 51 | Ht 72.0 in | Wt 172.0 lb

## 2019-06-19 DIAGNOSIS — E538 Deficiency of other specified B group vitamins: Secondary | ICD-10-CM

## 2019-06-19 DIAGNOSIS — Z5181 Encounter for therapeutic drug level monitoring: Secondary | ICD-10-CM | POA: Diagnosis not present

## 2019-06-19 DIAGNOSIS — R413 Other amnesia: Secondary | ICD-10-CM

## 2019-06-19 LAB — B12 AND FOLATE PANEL
Folate: 17.3 ng/mL (ref >5.9)
Vitamin B-12: 1078 pg/mL — ABNORMAL HIGH (ref 211–911)

## 2019-06-19 MED ORDER — PRIMIDONE 50 MG PO TABS: 50.0000 mg | ORAL_TABLET | Freq: Every day | ORAL | 1 refills | Status: DC

## 2019-06-19 NOTE — Patient Instructions (Addendum)
1.  Start primidone - 50 mg - 1/2 tablet at bedtime for 4 nights and then increase to 1 tablet at night.  This may not be enough for tremor but we will see how this does to start.  You have been referred for a neurocognitive evaluation in our office.   The evaluation has two parts.   . The first part of the evaluation is a clinical interview with the neuropsychologist (Dr. Melvyn Novas or Dr. Nicole Kindred). Please bring someone with you to this appointment if possible, as it is helpful for the doctor to hear from both you and another adult who knows you well.   . The second part of the evaluation is testing with the doctor's technician Hinton Dyer or [place]). The testing includes a variety of tasks- mostly question-and-answer, some paper-and-pencil. There is nothing you need to do to prepare for this appointment, but having a good night's sleep prior to the testing, and bringing eyeglasses and hearing aids (if you wear them), is advised. Please make sure that you wear a mask to the appointment.  Please note: We have to reserve several hours of the neuropsychologist's time and the psychometrician's time for your evaluation appointment. As such, please note that there is a No-Show fee of $100. If you are unable to attend any of your appointments, please contact our office as soon as possible to reschedule.  - Your provider has requested that you have labwork completed today. Please go to [facility] ([address]) on the second floor of this building before leaving the office today. You do not need to check in. If you are not called within 15 minutes please check with the front desk.   - A referral to [facility] has been placed for your MRI someone will contact you directly to schedule your appt. They are located at [address]. Please contact them directly by calling [phone] with any questions regarding your referral.

## 2019-06-22 ENCOUNTER — Telehealth: Payer: Self-pay

## 2019-06-22 LAB — RPR: RPR Ser Ql: NONREACTIVE

## 2019-06-22 NOTE — Telephone Encounter (Signed)
Called patient he was informed of results 

## 2019-06-22 NOTE — Telephone Encounter (Signed)
-----   Message from [place], DO sent at 06/22/2019 11:03 AM EDT ----- I have reviewed all lab results which are normal or stable. Please inform the patient.

## 2019-07-09 DIAGNOSIS — Z85828 Personal history of other malignant neoplasm of skin: Secondary | ICD-10-CM | POA: Diagnosis not present

## 2019-07-09 DIAGNOSIS — D485 Neoplasm of uncertain behavior of skin: Secondary | ICD-10-CM | POA: Diagnosis not present

## 2019-07-09 DIAGNOSIS — L570 Actinic keratosis: Secondary | ICD-10-CM | POA: Diagnosis not present

## 2019-07-09 DIAGNOSIS — L820 Inflamed seborrheic keratosis: Secondary | ICD-10-CM | POA: Diagnosis not present

## 2019-07-15 ENCOUNTER — Encounter: Payer: Self-pay | Admitting: Family Medicine

## 2019-07-16 ENCOUNTER — Other Ambulatory Visit: Payer: Self-pay

## 2019-07-16 ENCOUNTER — Other Ambulatory Visit (INDEPENDENT_AMBULATORY_CARE_PROVIDER_SITE_OTHER): Payer: PPO

## 2019-07-16 DIAGNOSIS — Z23 Encounter for immunization: Secondary | ICD-10-CM

## 2019-07-18 ENCOUNTER — Other Ambulatory Visit: Payer: Self-pay

## 2019-07-18 ENCOUNTER — Ambulatory Visit
Admission: RE | Admit: 2019-07-18 | Discharge: 2019-07-18 | Disposition: A | Discharge status: Home / Self Care | Payer: PPO | Source: Ambulatory Visit | Attending: Neurology | Admitting: Neurology

## 2019-07-18 DIAGNOSIS — Z5181 Encounter for therapeutic drug level monitoring: Secondary | ICD-10-CM

## 2019-07-18 DIAGNOSIS — R413 Other amnesia: Secondary | ICD-10-CM | POA: Diagnosis not present

## 2019-07-18 DIAGNOSIS — E538 Deficiency of other specified B group vitamins: Secondary | ICD-10-CM

## 2019-07-20 ENCOUNTER — Telehealth: Payer: Self-pay

## 2019-07-20 NOTE — Telephone Encounter (Signed)
Spoke with patient he was notified of results.
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
## 2019-09-11 ENCOUNTER — Ambulatory Visit: Payer: PPO | Admitting: Psychology

## 2019-09-11 ENCOUNTER — Encounter: Payer: Self-pay | Admitting: Psychology

## 2019-09-11 ENCOUNTER — Other Ambulatory Visit: Payer: Self-pay

## 2019-09-11 DIAGNOSIS — G3184 Mild cognitive impairment, so stated: Secondary | ICD-10-CM

## 2019-09-11 DIAGNOSIS — G309 Alzheimer's disease, unspecified: Secondary | ICD-10-CM

## 2019-09-11 DIAGNOSIS — R413 Other amnesia: Secondary | ICD-10-CM

## 2019-09-11 HISTORY — DX: Mild cognitive impairment of uncertain or unknown etiology: G31.84

## 2019-09-11 HISTORY — DX: Alzheimer's disease, unspecified: G30.9
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
## 2019-09-11 NOTE — Progress Notes (Signed)
   Neuropsychology Note   Eric Simmons. completed 135 minutes of neuropsychological testing with technician, Milana Kidney, B.S., under the supervision of Dr. Christia Reading, Ph.D., licensed neuropsychologist. The patient did not appear overtly distressed by the testing session, per behavioral observation or via self-report to the technician. Rest breaks were offered.    In considering the patient's current level of functioning, level of presumed impairment, nature of symptoms, emotional and behavioral responses during the interview, level of literacy, and observed level of motivation/effort, a battery of tests was selected and communicated to the psychometrician.   Communication between the psychologist and technician was ongoing throughout the testing session and changes were made as deemed necessary based on patient performance on testing, technician observations and additional pertinent factors such as those listed above.   Eric Simmons. will return within approximately two weeks for an interactive feedback session with Dr. Melvyn Novas at which time his test performances, clinical impressions, and treatment recommendations will be reviewed in detail. The patient understands he can contact our office should he require our assistance before this time.   Full report to follow.  135 minutes were spent face-to-face with patient administering standardized tests and 15 minutes were spent scoring (technician). [CPT [id number], [id number]

## 2019-09-21 ENCOUNTER — Encounter: Payer: Self-pay | Admitting: Psychology

## 2019-09-21 ENCOUNTER — Other Ambulatory Visit: Payer: Self-pay

## 2019-09-21 ENCOUNTER — Ambulatory Visit (INDEPENDENT_AMBULATORY_CARE_PROVIDER_SITE_OTHER): Payer: PPO | Admitting: Psychology

## 2019-09-21 DIAGNOSIS — G3184 Mild cognitive impairment, so stated: Secondary | ICD-10-CM

## 2019-09-21 NOTE — Progress Notes (Signed)
   Neuropsychology Feedback Session Eric Simmons. [facility]  Reason for Referral:   Eric Simmonsis a 78 y.o. Caucasian male referred by Alonza Bogus, D.O.,to characterize hiscurrent cognitive functioning and assist with diagnostic clarity and treatment planning in the context of subjective cognitive dysfunction, history of essential tremor, and positive neuroimaging results including superficial siderosis.  Feedback:   Eric Simmons completed a comprehensive neuropsychological evaluation on 09/11/2019. Please refer to that encounter for the full report. Briefly, results suggested impairments across semantic fluency, and additional weaknesses across phonemic fluency and retrieval/consolidation aspects of memory. Variability was noted across domains of processing speed, cognitive flexibility, response inhibition, and visuospatial abilities. The etiology of his cognitive deficits are unclear at the present time. However, Eric Simmons displayed prominent difficulties with retrieval aspects of memory, with retention rates at <50% across all 3 administered measures. Recognition trials were variable, but below expectation across 2 verbal memory measures. This pattern of performance, coupled with impaired semantic fluency scores could be suggestive of Alzheimer's disease. Likewise, the presence of medial temporal lobe atrophy and extensive superficial siderosis as seen on recent neuroimaging are also known risk factors for the development of this disease.  Eric Simmons was accompanied by his wife during this virtual visit. Content of the current session focused on the evaluation results. Eric Simmons and his wife were given the opportunity to ask questions and their questions were answered. They were also encouraged to reach out should additional questions arise.     A total of 25 minutes were spent with Eric Simmons during the current feedback session.
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
## 2019-10-08 ENCOUNTER — Encounter: Payer: Self-pay | Admitting: Neurology

## 2019-10-08 ENCOUNTER — Telehealth (INDEPENDENT_AMBULATORY_CARE_PROVIDER_SITE_OTHER): Payer: PPO | Admitting: Neurology

## 2019-10-08 ENCOUNTER — Other Ambulatory Visit: Payer: Self-pay

## 2019-10-08 ENCOUNTER — Encounter

## 2019-10-08 DIAGNOSIS — G250 Essential tremor: Secondary | ICD-10-CM

## 2019-10-08 DIAGNOSIS — G3184 Mild cognitive impairment, so stated: Secondary | ICD-10-CM | POA: Diagnosis not present

## 2019-10-14 ENCOUNTER — Encounter: Payer: PPO | Admitting: Psychology

## 2019-11-09 ENCOUNTER — Encounter: Payer: Self-pay | Admitting: Family Medicine

## 2019-11-09 ENCOUNTER — Other Ambulatory Visit: Payer: Self-pay

## 2019-11-09 ENCOUNTER — Ambulatory Visit (INDEPENDENT_AMBULATORY_CARE_PROVIDER_SITE_OTHER): Payer: PPO | Admitting: Family Medicine

## 2019-11-09 VITALS — BP 150/90 | HR 72 | Temp 97.0°F | Ht 72.0 in | Wt 177.8 lb

## 2019-11-09 DIAGNOSIS — E7800 Pure hypercholesterolemia, unspecified: Secondary | ICD-10-CM

## 2019-11-09 DIAGNOSIS — Z5181 Encounter for therapeutic drug level monitoring: Secondary | ICD-10-CM | POA: Diagnosis not present

## 2019-11-09 DIAGNOSIS — N529 Male erectile dysfunction, unspecified: Secondary | ICD-10-CM

## 2019-11-09 DIAGNOSIS — G250 Essential tremor: Secondary | ICD-10-CM

## 2019-11-09 DIAGNOSIS — R7301 Impaired fasting glucose: Secondary | ICD-10-CM

## 2019-11-09 DIAGNOSIS — N3000 Acute cystitis without hematuria: Secondary | ICD-10-CM | POA: Diagnosis not present

## 2019-11-09 DIAGNOSIS — R350 Frequency of micturition: Secondary | ICD-10-CM | POA: Diagnosis not present

## 2019-11-09 LAB — POCT URINALYSIS DIP (PROADVANTAGE DEVICE)
Bilirubin, UA: NEGATIVE
Glucose, UA: NEGATIVE mg/dL
Ketones, POC UA: NEGATIVE mg/dL
Nitrite, UA: POSITIVE — AB
Protein Ur, POC: NEGATIVE mg/dL
Specific Gravity, Urine: 1.02
Urobilinogen, Ur: NEGATIVE
pH, UA: 6 (ref 5.0–8.0)

## 2019-11-09 MED ORDER — SULFAMETHOXAZOLE-TRIMETHOPRIM 800-160 MG PO TABS: 1.0000 | ORAL_TABLET | Freq: Two times a day (BID) | ORAL | 0 refills | Status: DC

## 2019-11-09 NOTE — Patient Instructions (Signed)
Take the antibiotic twice daily for a week. Contact us if you aren't noticing any improvement (or if worse) after 48 hours. We will contact you with the culture results when available (and change antibiotics, if deemed necessary).    Urinary Tract Infection, Adult A urinary tract infection (UTI) is an infection of any part of the urinary tract. The urinary tract includes:  The kidneys.  The ureters.  The bladder.  The urethra. These organs make, store, and get rid of pee (urine) in the body. What are the causes? This is caused by germs (bacteria) in your genital area. These germs grow and cause swelling (inflammation) of your urinary tract. What increases the risk? You are more likely to develop this condition if:  You have a small, thin tube (catheter) to drain pee.  You cannot control when you pee or poop (incontinence).  You are male, and: ? You use these methods to prevent pregnancy:  A medicine that kills sperm (spermicide).  A device that blocks sperm (diaphragm). ? You have low levels of a male hormone (estrogen). ? You are pregnant.  You have genes that add to your risk.  You are sexually active.  You take antibiotic medicines.  You have trouble peeing because of: ? A prostate that is bigger than normal, if you are male. ? A blockage in the part of your body that drains pee from the bladder (urethra). ? A kidney stone. ? A nerve condition that affects your bladder (neurogenic bladder). ? Not getting enough to drink. ? Not peeing often enough.  You have other conditions, such as: ? Diabetes. ? A weak disease-fighting system (immune system). ? Sickle cell disease. ? Gout. ? Injury of the spine. What are the signs or symptoms? Symptoms of this condition include:  Needing to pee right away (urgently).  Peeing often.  Peeing small amounts often.  Pain or burning when peeing.  Blood in the pee.  Pee that smells bad or not like  normal.  Trouble peeing.  Pee that is cloudy.  Fluid coming from the vagina, if you are male.  Pain in the belly or lower back. Other symptoms include:  Throwing up (vomiting).  No urge to eat.  Feeling mixed up (confused).  Being tired and grouchy (irritable).  A fever.  Watery poop (diarrhea). How is this treated? This condition may be treated with:  Antibiotic medicine.  Other medicines.  Drinking enough water. Follow these instructions at home:  Medicines  Take over-the-counter and prescription medicines only as told by your doctor.  If you were prescribed an antibiotic medicine, take it as told by your doctor. Do not stop taking it even if you start to feel better. General instructions  Make sure you: ? Pee until your bladder is empty. ? Do not hold pee for a long time. ? Empty your bladder after sex. ? Wipe from front to back after pooping if you are a male. Use each tissue one time when you wipe.  Drink enough fluid to keep your pee pale yellow.  Keep all follow-up visits as told by your doctor. This is important. Contact a doctor if:  You do not get better after 1-2 days.  Your symptoms go away and then come back. Get help right away if:  You have very bad back pain.  You have very bad pain in your lower belly.  You have a fever.  You are sick to your stomach (nauseous).  You are throwing up. Summary  A urinary tract infection (UTI) is an infection of any part of the urinary tract.  This condition is caused by germs in your genital area.  There are many risk factors for a UTI. These include having a small, thin tube to drain pee and not being able to control when you pee or poop.  Treatment includes antibiotic medicines for germs.  Drink enough fluid to keep your pee pale yellow. This information is not intended to replace advice given to you by your health care provider. Make sure you discuss any questions you have with your  health care provider. Document Revised: 10/02/2018 Document Reviewed: 04/24/2018 Elsevier Patient Education  2020 [employer].

## 2019-11-09 NOTE — Progress Notes (Signed)
Chief Complaint  Patient presents with  . Urinary Frequency    he says it is about every 2 hours all day and probably every hour at night. Not a full bladder each time. Also mentioned that he used to take 2 sildenafil and that would work, most recently he tried 5 pills without any response.   . Other    scheduled AWV and lab visit today for Feb-needs future orders.   About a week ago he noticed he would have to get up twice a night to void, getting worse, now getting up 4-5x/night.  He is also having urinary urgency and frequency during the day.  Denies dysuria, abdominal or flank pain.  He had some discomfort in his penis the last couple of times he tried to have intercourse with his wife, and the second time he had erectile problem (this past week, despite 127m sildenafil). He has been needing higher and higher doses of the sildenafil, but 80-1040mhad been working, until the last time.  He is scheduled for COVID vaccine on 1/30.  PMH, PSH, SH reviewed  Outpatient Encounter Medications as of 11/09/2019  Medication Sig Note  . cholecalciferol (VITAMIN D) 1000 units tablet Take 1,000 Units by mouth daily.   . Marland Kitcheno-Enzyme Q-10 100 MG CAPS Take 300 mg by mouth daily.   . Cyanocobalamin (VITAMIN B 12 PO) Take 1 capsule by mouth daily.   . Glucosamine-Chondroit-Vit C-Mn (GLUCOSAMINE 1500 COMPLEX) CAPS Take 1 capsule by mouth daily.   . primidone (MYSOLINE) 50 MG tablet Take 1 tablet (50 mg total) by mouth at bedtime.   . sildenafil (REVATIO) 20 MG tablet TAKE 2 TO 5 TABLETS BY MOUTH AS NEEDED   . simvastatin (ZOCOR) 40 MG tablet Take 1 tablet (40 mg total) by mouth at bedtime.   . Marland Kitchencetaminophen (TYLENOL) 500 MG tablet Take 500 mg by mouth 2 (two) times daily. Reported on 12/28/2015 06/22/2015: Uses prn pain, not often   Facility-Administered Encounter Medications as of 11/09/2019  Medication  . 0.9 %  sodium chloride infusion     Allergies  Allergen Reactions  . Codeine     REACTION:  violent vomiting   ROS:  Denies fever, chills, URI symptoms, headache, cough, shortness of breath, chest pain. Denies nausea, vomiting, diarrhea.  Urinary and ED symptoms per HPI.  No bleeding, bruising, rashes.  Memory is stable. Tremor has improved with treatment.   PHYSICAL EXAM:  BP (!) 150/90   Pulse 72   Temp (!) 97 F (36.1 C) (Other (Comment))   Ht 6' (1.829 m)   Wt 177 lb 12.8 oz (80.6 kg)   BMI 24.11 kg/m   Well-appearing, pleasant male, in no acute distress HEENT: conjunctiva and sclera are clear, EOMI. Wearing mask due to COVID-19 pandemic Heart: regular rate and rhythm Lungs: clear bilaterally Abdomen: soft, nontender, no mass Back: no spinal or CVA tenderness Extremities: no edema Neuro: alert and oriented, normal gait. Mild tremor R>L (mainly noticed with pointer fingers directed at each other)  Urine dip: 2+ leuks, +nitrite, trace blood, SG 1.020  ASSESSMENT/PLAN:  Acute cystitis without hematuria - h/o BPH with nocturia, but clearly has urg/freq during day with abnl urine dip. Culture sent - Plan: Urine culture, sulfamethoxazole-trimethoprim (BACTRIM DS) 800-160 MG tablet  Urinary frequency - Plan: POCT Urinalysis DIP (Proadvantage Device)  Medication monitoring encounter - Plan: Comprehensive metabolic panel, Lipid panel  Pure hypercholesterolemia - Plan: Lipid panel  IFG (impaired fasting glucose) - Plan: Comprehensive metabolic panel, Hemoglobin A1c  Essential tremor - improved with primidone  Erectile dysfunction, unspecified erectile dysfunction type - If doesn't improve after infection cleared, consider daily Cialis (given BPH), vs prn    He was scheduled for AWV in February, would like labs done prior to visit. Orders entered. c-met, lipids, A1c

## 2019-11-11 LAB — URINE CULTURE

## 2019-11-11 NOTE — Progress Notes (Signed)
Since we have it documented that this current antibiotic should be handling the infection, I recommend completing the entire course of medication.  Night-time symptoms may possibly also be related to BPH (enlarged prostate). Hopefully his daytime urgency/frequency has improved some also

## 2019-11-18 ENCOUNTER — Ambulatory Visit: Payer: PPO | Attending: Internal Medicine

## 2019-11-18 DIAGNOSIS — Z23 Encounter for immunization: Secondary | ICD-10-CM

## 2019-11-18 NOTE — Progress Notes (Signed)
   [id number]  Name:  Arty Gradillas.    MRN: [id number] DOB: February 12, 1941  11/18/2019  Mr. Stansel was observed post Covid-19 immunization for 15 minutes without incidence. He was provided with Vaccine Information Sheet and instruction to access the V-Safe system.   Mr. Starcher was instructed to call 911 with any severe reactions post vaccine: Marland Kitchen Difficulty breathing  . Swelling of your face and throat  . A fast heartbeat  . A bad rash all over your body  . Dizziness and weakness    Immunizations Administered    Name Date Dose VIS Date Route   Pfizer COVID-19 Vaccine 11/18/2019  9:48 AM 0.3 mL 10/09/2019 Intramuscular   Manufacturer: [place]   Lot: [id number]   [place]: [id number]

## 2019-11-19 ENCOUNTER — Ambulatory Visit: Payer: PPO

## 2019-11-24 ENCOUNTER — Other Ambulatory Visit: Payer: Self-pay | Admitting: Family Medicine

## 2019-11-24 DIAGNOSIS — E7800 Pure hypercholesterolemia, unspecified: Secondary | ICD-10-CM

## 2019-11-28 ENCOUNTER — Ambulatory Visit: Payer: PPO

## 2019-12-07 ENCOUNTER — Ambulatory Visit: Payer: PPO | Attending: Internal Medicine

## 2019-12-07 DIAGNOSIS — Z23 Encounter for immunization: Secondary | ICD-10-CM | POA: Insufficient documentation

## 2019-12-07 NOTE — Progress Notes (Signed)
   [id number]  Name:  Eric Simmons.    MRN: [id number] DOB: 1941/03/18  12/07/2019  Mr. Eric Simmons was observed post Covid-19 immunization for 15 minutes without incidence. He was provided with Vaccine Information Sheet and instruction to access the V-Safe system.   [name] was instructed to call 911 with any severe reactions post vaccine: Marland Kitchen Difficulty breathing  . Swelling of your face and throat  . A fast heartbeat  . A bad rash all over your body  . Dizziness and weakness    Immunizations Administered    Name Date Dose VIS Date Route   Pfizer COVID-19 Vaccine 12/07/2019  1:28 PM 0.3 mL 10/09/2019 Intramuscular   Manufacturer: [place]   Lot: [id number]   [place]: [id number]

## 2019-12-09 ENCOUNTER — Other Ambulatory Visit: Payer: Self-pay

## 2019-12-09 ENCOUNTER — Other Ambulatory Visit: Payer: PPO

## 2019-12-09 DIAGNOSIS — E7800 Pure hypercholesterolemia, unspecified: Secondary | ICD-10-CM | POA: Diagnosis not present

## 2019-12-09 DIAGNOSIS — Z5181 Encounter for therapeutic drug level monitoring: Secondary | ICD-10-CM

## 2019-12-09 DIAGNOSIS — R7301 Impaired fasting glucose: Secondary | ICD-10-CM

## 2019-12-10 LAB — COMPREHENSIVE METABOLIC PANEL
ALT: 21 IU/L (ref 0–44)
AST: 25 IU/L (ref 0–40)
Albumin/Globulin Ratio: 2 (ref 1.2–2.2)
Albumin: 4.3 g/dL (ref 3.7–4.7)
Alkaline Phosphatase: 94 IU/L (ref 39–117)
BUN/Creatinine Ratio: 11 (ref 10–24)
BUN: 14 mg/dL (ref 8–27)
Bilirubin Total: 0.5 mg/dL (ref 0.0–1.2)
CO2: 24 mmol/L (ref 20–29)
Calcium: 9.7 mg/dL (ref 8.6–10.2)
Chloride: 103 mmol/L (ref 96–106)
Creatinine, Ser: 1.24 mg/dL (ref 0.76–1.27)
GFR calc Af Amer: 63 mL/min/{1.73_m2} (ref >59)
GFR calc non Af Amer: 55 mL/min/{1.73_m2} — ABNORMAL LOW (ref >59)
Globulin, Total: 2.2 g/dL (ref 1.5–4.5)
Glucose: 98 mg/dL (ref 65–99)
Potassium: 4.8 mmol/L (ref 3.5–5.2)
Sodium: 141 mmol/L (ref 134–144)
Total Protein: 6.5 g/dL (ref 6.0–8.5)

## 2019-12-10 LAB — LIPID PANEL
Chol/HDL Ratio: 3.2 ratio (ref 0.0–5.0)
Cholesterol, Total: 149 mg/dL (ref 100–199)
HDL: 47 mg/dL (ref >39)
LDL Chol Calc (NIH): 82 mg/dL (ref 0–99)
Triglycerides: 108 mg/dL (ref 0–149)
VLDL Cholesterol Cal: 20 mg/dL (ref 5–40)

## 2019-12-10 LAB — HEMOGLOBIN A1C
Est. average glucose Bld gHb Est-mCnc: 126 mg/dL
Hgb A1c MFr Bld: 6 % — ABNORMAL HIGH (ref 4.8–5.6)

## 2019-12-15 NOTE — Progress Notes (Signed)
Chief Complaint  Patient presents with  . Medicare Wellness    nonfasting AWV/CPE-has urinary issue (could not give me a urine sample), feels like he cannot push out his urine and he sometime get two sprays. Sildenafil is causing pain on the right side of his penis (internally). Dr.Dahlstedt worked on his first wife-he does like him if he needs a referral, but would be willing to see anyone.     Eric Simmons. is a 79 y.o. male who presents for annual physical exam, Medicare wellness visit and follow-up on chronic medical conditions. He had labs done prior to his visit, see below. He has the following concerns:  He was seen last month with urinary complaints.  He was treated for acute cystitis with Bactrim DS. Culture showed Staphylococcus lugdunensis, sensitive to the Bactrim that was prescribed. Symptoms resolved, no longer having the urgency/frequency during the day like he did (only occasional).  He has noted decreased stream, some intermittent hesitancy, some sporadic dribbling, and he continues to have nocturia (up to 5x/night, typically 2-3x). Empties bladder well only sometimes, not as well as in the past.Denies hematuria, odor, dysuria.  He never tried saw palmetto. He is interested in trying a prescription medication for this.  Erectile dysfunction: Changed to generic sildenafil from Viagra for cost purposes. He has been needing higher doses to be effective, 80-100mg . He recently started noted having some discomfort with his erections (x 3 months, only active 1-2x/month), pain only with erection, resolves after, and erections are not prolonged. He once noticed a slight curvature.  He previously was noted to have elevated blood pressures.  At his physical last year we had discussed low sodium diet, and monitoring elsewhere.  He did not return for follow-up with list of blood pressures. He hasn't checked BP elsewhere since then. He denies headaches, dizziness, chest pain.  BP  Readings from Last 3 Encounters:  11/09/19 (!) 150/90  06/19/19 (!) 168/84  11/10/18 (!) 144/80    He is under the care of Dr. Carles Collet for essential tremor and memory concerns. He is on primidone for tremor. He had neuropsych evaluation with Dr. Melvyn Novas, consistent with mid cognitive impairment.  No medications were recommended.  Recommended physical and mental exercise. Tremor isn't much better with the primidone.  Really only bothers him related to handwriting.  Hyperlipidemia follow-up: Patient is reportedly following a low-fat, low cholesterol diet. Compliant with medications (simvastatin 40mg ) and denies medication side effects.  H/o Insomnia--He continues to sleep well--he no longer has trouble falling asleep (previously needed to take 1/4 tablet of 5mg  zolpidem most nights to get to sleep).  Impaired fasting glucose. This has been very mild, ongoing/nonprogressive x years. He continues to exercise regularly. No polydipsia, polyuria (just nocturia).  H/o Left hip pain: Advanced OA noted on x-ray.Cortisone injections have been helpful.Gets hip injections about once a year, usually in the summer.  He doesn't have pain daily, but more when very active (yardwork, lots of squatting).  He uses tylenol prn for pain. He continues to takeglucosamine/chondroitin and coenzyme Q10 which helps a lot.  Immunization History  Administered Date(s) Administered  . Fluad Quad(high Dose 65+) 07/16/2019  . Influenza Split 07/18/2011, 08/28/2012  . Influenza, High Dose Seasonal PF 08/19/2014, 06/22/2015, 08/20/2016, 08/20/2017  . Influenza,inj,Quad PF,6+ Mos 07/06/2013  . Influenza-Unspecified 07/25/2018  . PFIZER SARS-COV-2 Vaccination 11/18/2019, 12/07/2019  . Pneumococcal Conjugate-13 06/14/2014  . Pneumococcal Polysaccharide-23 12/13/2011  . Tdap 03/02/2011  . Zoster 03/25/2014   Last colonoscopy: 12/2017 (Dr.  Gessner)--tubular adenomas x 4. He was told no follow up was needed (due to age) Last  PSA: (no longer done due to age) Lab Results  Component Value Date   PSA 1.5 10/30/2017   PSA 1.73 12/27/2015   PSA 1.87 12/06/2014   Dentist: twice yearly  Ophtho:yearly (to two doctors) Exercise: Goes to [employer] least 3x/week (since they re-opened), walking 2 miles on the cushioned track (40-45 mins),and weight-bearing exercise;plus walking in neighborhood.  Other doctors caring for patient include: Ophtho-- [facility] (cannot recall name, on [employer]) Gastroenterologist: Dr. [name]  Dermatologist: Dr. Dian Situ Jones(2x/year) Dentist: Dr. Lonia Chimera  Ortho: [facility] (Dr. Mina Marble gave the hip injection). Neuro: Dr. Carles Collet (Drd. Melvyn Novas did neuropsych testing)  Depression screen:negative Fall screen: negative Functional Status Survey:Notable for memory concerns. Some decline in vision, trouble with stairs. Some leakage of urine sporadically.  Mini-Cog screen:  Normal recall; clock drawing notable for tremor.  Numbers were written on the outside of the clock, and no hands were on the clock. See full questionnaires in epic.   End of Life Discussion: Patient hasa living will and medical power of attorney   PMH, PSH, Epworth and FH reviewed and updated  Outpatient Encounter Medications as of 12/16/2019  Medication Sig Note  . cholecalciferol (VITAMIN D) 1000 units tablet Take 1,000 Units by mouth daily.   Marland Kitchen Co-Enzyme Q-10 100 MG CAPS Take 300 mg by mouth daily.   . Cyanocobalamin (VITAMIN B 12 PO) Take 1 capsule by mouth daily.   . Glucosamine-Chondroit-Vit C-Mn (GLUCOSAMINE 1500 COMPLEX) CAPS Take 1 capsule by mouth daily.   . primidone (MYSOLINE) 50 MG tablet Take 1 tablet (50 mg total) by mouth at bedtime.   . simvastatin (ZOCOR) 40 MG tablet TAKE ONE TABLET AT BEDTIME   . acetaminophen (TYLENOL) 500 MG tablet Take 500 mg by mouth 2 (two) times daily. Reported on 12/28/2015 06/22/2015: Uses prn pain, not often  . sildenafil (REVATIO) 20 MG tablet  TAKE 2 TO 5 TABLETS BY MOUTH AS NEEDED (Patient not taking: Reported on 12/16/2019)   . [DISCONTINUED] sulfamethoxazole-trimethoprim (BACTRIM DS) 800-160 MG tablet Take 1 tablet by mouth 2 (two) times daily.    Facility-Administered Encounter Medications as of 12/16/2019  Medication  . 0.9 %  sodium chloride infusion   Allergies  Allergen Reactions  . Codeine     REACTION: violent vomiting    ROS: The patient denies anorexia, fever, weight changes, headaches, decreased hearing, ear pain, hoarseness, chest pain, palpitations, dizziness, syncope, dyspnea on exertion, cough, swelling, nausea, vomiting, diarrhea, abdominal pain, melena, hematochezia, indigestion/heartburn, hematuria, weakened urine stream, dysuria, genital lesions, joint pains, weakness, suspicious skin lesions (sees derm regularly, gets treated), depression, anxiety, abnormal bleeding/bruising, or enlarged lymph nodes  Denies insomnia (resolved). Urinary symptoms per HPI. +ED, controlled withsildenafil, though has needed higher doses, and last couple of times has noted some penile discomfort with erection. Tremor in right hand persists, despite medications. Left hip pain (mild), no other joint pains.    PHYSICAL EXAM:  BP (!) 170/90   Pulse 64   Temp (!) 96.9 F (36.1 C) (Other (Comment))   Ht 6' (1.829 m)   Wt 179 lb 3.2 oz (81.3 kg)   BMI 24.30 kg/m   164/90 on repeat by MD  Wt Readings from Last 3 Encounters:  11/09/19 177 lb 12.8 oz (80.6 kg)  06/19/19 172 lb (78 kg)  11/10/18 178 lb 9.6 oz (81 kg)   General Appearance:  Alert,  cooperative, no distress, appears stated age   Head:  Normocephalic, without obvious abnormality, atraumatic   Eyes:  PERRL, conjunctiva/corneas clear, EOM's intact, fundibenign. Brown pigmentation to upper portion of R eye (rest is blue), unchanged   Ears:  Normal TM's and external ear canals.   Nose:  Not examined, wearing mask due to COVID-19 pandemic  Throat:  Not  examined, wearing mask due to COVID-19 pandemic  Neck:  Supple, no lymphadenopathy; thyroid: no enlargement/tenderness/nodules; no carotid bruit or JVD. C-spine nontender  Back:  Spine nontender, no curvature, ROM normal, no CVA tenderness   Lungs:  Clear to auscultation bilaterally without wheezes, rales or ronchi; respirations unlabored   Chest Wall:  No tenderness or deformity   Heart:  Regular rate and rhythm, S1 and S2 normal, no murmur, rub or gallop   Breast Exam:  No chest wall tenderness, masses or gynecomastia   Abdomen:  Soft, non-tender, nondistended, normoactive bowel sounds, no masses, no hepatosplenomegaly   Genitalia:  Normal male external genitalia without lesions. Testicles without masses. No inguinal hernias.  Rectal:  Normal sphincter tone, no masses or tenderness; guaiac negative stool. Prostate smooth, no nodules,mildly enlarged,nonboggy, nontender.  Extremities:  No clubbing, cyanosis or edema.  Pulses:  2+ and symmetric all extremities   Skin:  Skin color, turgor normal, no rashes. Skin is dry, especially on arms and legs  Lymph  nodes:  Cervical, supraclavicular, inguinal and axillary nodes normal   Neurologic:  Normal strength, sensation and gait; reflexes 2+ and symmetric throughout. Very mild, fine resting tremor of RUE, more pronounced with writing.    Psych:    Normal mood, affect, hygiene and grooming   Lab Results  Component Value Date   HGBA1C 6.0 (H) 12/09/2019   Fasting glucose 98    Chemistry      Component Value Date/Time   NA 141 12/09/2019 0836   K 4.8 12/09/2019 0836   CL 103 12/09/2019 0836   CO2 24 12/09/2019 0836   BUN 14 12/09/2019 0836   CREATININE 1.24 12/09/2019 0836   CREATININE 1.11 10/30/2017 0944      Component Value Date/Time   CALCIUM 9.7 12/09/2019 0836   ALKPHOS 94 12/09/2019 0836   AST 25 12/09/2019 0836   ALT 21 12/09/2019 0836   BILITOT 0.5 12/09/2019 0836     Lab Results   Component Value Date   CHOL 149 12/09/2019   HDL 47 12/09/2019   LDLCALC 82 12/09/2019   TRIG 108 12/09/2019   CHOLHDL 3.2 12/09/2019   Urine dip: SG 1.025, moderate blood, +protein, nitrite+, trace leuks. Culture sent.  EKG sinus bradycardia, LVH    ASSESSMENT/PLAN:  Annual physical exam - Plan: EKG 12-Lead  Medicare annual wellness visit, subsequent  Benign prostatic hyperplasia with nocturia - trial of tamsulosin. Also briefly discussed daily Cialis, but will try this first. - Plan: tamsulosin (FLOMAX) 0.4 MG CAPS capsule, POCT Urinalysis DIP (Proadvantage Device)  Pure hypercholesterolemia  IFG (impaired fasting glucose) - counseled re: diet, exercise  Erectile dysfunction, unspecified erectile dysfunction type - Cont sildenafil 80-100mg .  Consider daily Cialis instead. If discomfort/curvature with erections persist, will refer to urology (to eval/treat peyronies)  Essential tremor  MCI (mild cognitive impairment) - stable  Urinary frequency - daytime resolved, now mainly at night.  - Plan: Urine Culture  Abnormal urinalysis - recent UTI, persistently abnl u/a.  Culture sent - Plan: Urine Culture  Elevated blood pressure reading - elevated in office, not checked elsewhere.  EKG shows LVH,  so likely will need treatment. Low sodium trial, check BP elsewhere, f/u 4-6 wks    tamsulosin for BPH If ongoing pain or urinary issues, we can send to urology for further evaluation (He prefers Dr. Diona Fanti). Consider daily cialis. Poss peyronie's  Pt advised to follow low Na diet, to get a BP monitor and monitor regularly.  To bring monitor and list of BP and pulse to visit in 4-6 weeks.   No further PSA screening is recommended, based on age. Recommended at least 30 minutes of aerobic activity at least 5 days/week, weight bearing exercise at least 2x/week; proper sunscreen use reviewed; healthy diet and alcohol recommendations (less than or equal to 2 drinks/day) reviewed;  regular seatbelt use; changing batteries in smoke detectors. Immunization recommendations discussed--continuehigh dose flu shotsyearly. Shingrix recommended; risks/SE reviewed, to get from pharmacy.Tetanus booster next year. Colonoscopy is UTD.  MOST form reviewed and updated. Full Code, Full Care.  F/u 4-6 weeks on BP, BPH.   Medicare Attestation I have personally reviewed: The patient's medical and social history Their use of alcohol, tobacco or illicit drugs Their current medications and supplements The patient's functional ability including ADLs,fall risks, home safety risks, cognitive, and hearing and visual impairment Diet and physical activities Evidence for depression or mood disorders  The patient's weight, height, BMI have been recorded in the chart.  I have made referrals, counseling, and provided education to the patient based on review of the above and I have provided the patient with a written personalized care plan for preventive services.

## 2019-12-15 NOTE — Patient Instructions (Addendum)
HEALTH MAINTENANCE RECOMMENDATIONS:  It is recommended that you get at least 30 minutes of aerobic exercise at least 5 days/week (for weight loss, you may need as much as 60-90 minutes). This can be any activity that gets your heart rate up. This can be divided in 10-15 minute intervals if needed, but try and build up your endurance at least once a week.  Weight bearing exercise is also recommended twice weekly.  Eat a healthy diet with lots of vegetables, fruits and fiber.  "Colorful" foods have a lot of vitamins (ie green vegetables, tomatoes, red peppers, etc).  Limit sweet tea, regular sodas and alcoholic beverages, all of which has a lot of calories and sugar.  Up to 2 alcoholic drinks daily may be beneficial for men (unless trying to lose weight, watch sugars).  Drink a lot of water.  Sunscreen of at least SPF 30 should be used on all sun-exposed parts of the skin when outside between the hours of 10 am and 4 pm (not just when at beach or pool, but even with exercise, golf, tennis, and yard work!)  Use a sunscreen that says "broad spectrum" so it covers both UVA and UVB rays, and make sure to reapply every 1-2 hours.  Remember to change the batteries in your smoke detectors when changing your clock times in the spring and fall. Carbon monoxide detectors are recommended for your home.  Use your seat belt every time you are in a car, and please drive safely and not be distracted with cell phones and texting while driving.   Mr. Wizner , Thank you for taking time to come for your Medicare Wellness Visit. I appreciate your ongoing commitment to your health goals. Please review the following plan we discussed and let me know if I can assist you in the future.    This is a list of the screening recommended for you and due dates:  Health Maintenance  Topic Date Due  . Tetanus Vaccine  03/01/2021  . Flu Shot  Completed  . Pneumonia vaccines  Completed   Tetanus booster will be due next year (to  get from pharmacy, Tdap).  I recommend getting the new shingles vaccine (Shingrix). You will need to get this from the pharmacy rather than our office, as it is covered by Medicare Part D.  It is a series of 2 injections, spaced 2 months apart. You must wait 4 weeks after the 2nd COVID vaccine before getting Shingrix.  Continue yearly high dose flu shots.  Your blood pressure has been elevated.  Please monitor your blood pressure regularly, bring your monitor and your list of blood pressures to follow-up visit. I recommend getting an arm monitor (not wrist); Omron and Relion are good brands. Cut back on the sodium in your diet.   DASH Eating Plan DASH stands for "Dietary Approaches to Stop Hypertension." The DASH eating plan is a healthy eating plan that has been shown to reduce high blood pressure (hypertension). It may also reduce your risk for type 2 diabetes, heart disease, and stroke. The DASH eating plan may also help with weight loss. What are tips for following this plan?  General guidelines  Avoid eating more than 2,300 mg (milligrams) of salt (sodium) a day. If you have hypertension, you may need to reduce your sodium intake to 1,500 mg a day.  Limit alcohol intake to no more than 1 drink a day for nonpregnant women and 2 drinks a day for men. One drink equals  12 oz of beer, 5 oz of wine, or 1 oz of hard liquor.  Work with your health care provider to maintain a healthy body weight or to lose weight. Ask what an ideal weight is for you.  Get at least 30 minutes of exercise that causes your heart to beat faster (aerobic exercise) most days of the week. Activities may include walking, swimming, or biking.  Work with your health care provider or diet and nutrition specialist (dietitian) to adjust your eating plan to your individual calorie needs. Reading food labels   Check food labels for the amount of sodium per serving. Choose foods with less than 5 percent of the Daily Value  of sodium. Generally, foods with less than 300 mg of sodium per serving fit into this eating plan.  To find whole grains, look for the word "whole" as the first word in the ingredient list. Shopping  Buy products labeled as "low-sodium" or "no salt added."  Buy fresh foods. Avoid canned foods and premade or frozen meals. Cooking  Avoid adding salt when cooking. Use salt-free seasonings or herbs instead of table salt or sea salt. Check with your health care provider or pharmacist before using salt substitutes.  Do not fry foods. Cook foods using healthy methods such as baking, boiling, grilling, and broiling instead.  Cook with heart-healthy oils, such as olive, canola, soybean, or sunflower oil. Meal planning  Eat a balanced diet that includes: ? 5 or more servings of fruits and vegetables each day. At each meal, try to fill half of your plate with fruits and vegetables. ? Up to 6-8 servings of whole grains each day. ? Less than 6 oz of lean meat, poultry, or fish each day. A 3-oz serving of meat is about the same size as a deck of cards. One egg equals 1 oz. ? 2 servings of low-fat dairy each day. ? A serving of nuts, seeds, or beans 5 times each week. ? Heart-healthy fats. Healthy fats called Omega-3 fatty acids are found in foods such as flaxseeds and coldwater fish, like sardines, salmon, and mackerel.  Limit how much you eat of the following: ? Canned or prepackaged foods. ? Food that is high in trans fat, such as fried foods. ? Food that is high in saturated fat, such as fatty meat. ? Sweets, desserts, sugary drinks, and other foods with added sugar. ? Full-fat dairy products.  Do not salt foods before eating.  Try to eat at least 2 vegetarian meals each week.  Eat more home-cooked food and less restaurant, buffet, and fast food.  When eating at a restaurant, ask that your food be prepared with less salt or no salt, if possible. What foods are recommended? The items  listed may not be a complete list. Talk with your dietitian about what dietary choices are best for you. Grains Whole-grain or whole-wheat bread. Whole-grain or whole-wheat pasta. Brown rice. [place]. Bulgur. Whole-grain and low-sodium cereals. Pita bread. Low-fat, low-sodium crackers. Whole-wheat flour tortillas. Vegetables Fresh or frozen vegetables (raw, steamed, roasted, or grilled). Low-sodium or reduced-sodium tomato and vegetable juice. Low-sodium or reduced-sodium tomato sauce and tomato paste. Low-sodium or reduced-sodium canned vegetables. Fruits All fresh, dried, or frozen fruit. Canned fruit in natural juice (without added sugar). Meat and other protein foods Skinless chicken or [place]. Ground chicken or [place]. Pork with fat trimmed off. Fish and seafood. Egg whites. Dried beans, peas, or lentils. Unsalted nuts, nut butters, and seeds. Unsalted canned beans. Lean cuts of  beef with fat trimmed off. Low-sodium, lean deli meat. Dairy Low-fat (1%) or fat-free (skim) milk. Fat-free, low-fat, or reduced-fat cheeses. Nonfat, low-sodium ricotta or cottage cheese. Low-fat or nonfat yogurt. Low-fat, low-sodium cheese. Fats and oils Soft margarine without trans fats. Vegetable oil. Low-fat, reduced-fat, or light mayonnaise and salad dressings (reduced-sodium). Canola, safflower, olive, soybean, and sunflower oils. Avocado. Seasoning and other foods Herbs. Spices. Seasoning mixes without salt. Unsalted popcorn and pretzels. Fat-free sweets. What foods are not recommended? The items listed may not be a complete list. Talk with your dietitian about what dietary choices are best for you. Grains Baked goods made with fat, such as croissants, muffins, or some breads. Dry pasta or rice meal packs. Vegetables Creamed or fried vegetables. Vegetables in a cheese sauce. Regular canned vegetables (not low-sodium or reduced-sodium). Regular canned tomato sauce and paste (not low-sodium or  reduced-sodium). Regular tomato and vegetable juice (not low-sodium or reduced-sodium). Angie Fava. Olives. Fruits Canned fruit in a light or heavy syrup. Fried fruit. Fruit in cream or butter sauce. Meat and other protein foods Fatty cuts of meat. Ribs. Fried meat. Berniece Salines. Sausage. Bologna and other processed lunch meats. Salami. Fatback. Hotdogs. Bratwurst. Salted nuts and seeds. Canned beans with added salt. Canned or smoked fish. Whole eggs or egg yolks. Chicken or [place] with skin. Dairy Whole or 2% milk, cream, and half-and-half. Whole or full-fat cream cheese. Whole-fat or sweetened yogurt. Full-fat cheese. Nondairy creamers. Whipped toppings. Processed cheese and cheese spreads. Fats and oils Butter. Stick margarine. Lard. Shortening. Ghee. Bacon fat. Tropical oils, such as coconut, palm kernel, or palm oil. Seasoning and other foods Salted popcorn and pretzels. Onion salt, garlic salt, seasoned salt, table salt, and sea salt. Worcestershire sauce. Tartar sauce. Barbecue sauce. Teriyaki sauce. Soy sauce, including reduced-sodium. Steak sauce. Canned and packaged gravies. Fish sauce. Oyster sauce. Cocktail sauce. Horseradish that you find on the shelf. Ketchup. Mustard. Meat flavorings and tenderizers. Bouillon cubes. Hot sauce and Tabasco sauce. Premade or packaged marinades. Premade or packaged taco seasonings. Relishes. Regular salad dressings. Where to find more information:  National Heart, Lung, and [place]: https://wilson-eaton.com/  American Heart Association: www.heart.org Summary  The DASH eating plan is a healthy eating plan that has been shown to reduce high blood pressure (hypertension). It may also reduce your risk for type 2 diabetes, heart disease, and stroke.  With the DASH eating plan, you should limit salt (sodium) intake to 2,300 mg a day. If you have hypertension, you may need to reduce your sodium intake to 1,500 mg a day.  When on the DASH eating plan, aim to eat more  fresh fruits and vegetables, whole grains, lean proteins, low-fat dairy, and heart-healthy fats.  Work with your health care provider or diet and nutrition specialist (dietitian) to adjust your eating plan to your individual calorie needs. This information is not intended to replace advice given to you by your health care provider. Make sure you discuss any questions you have with your health care provider. Document Revised: 09/27/2017 Document Reviewed: 10/08/2016 Elsevier Patient Education  2020 [employer].    Benign Prostatic Hyperplasia  Benign prostatic hyperplasia (BPH) is an enlarged prostate gland that is caused by the normal aging process and not by cancer. The prostate is a walnut-sized gland that is involved in the production of semen. It is located in front of the rectum and below the bladder. The bladder stores urine and the urethra is the tube that carries the urine out of the body. The  prostate may get bigger as a man gets older. An enlarged prostate can press on the urethra. This can make it harder to pass urine. The build-up of urine in the bladder can cause infection. Back pressure and infection may progress to bladder damage and kidney (renal) failure. What are the causes? This condition is part of a normal aging process. However, not all men develop problems from this condition. If the prostate enlarges away from the urethra, urine flow will not be blocked. If it enlarges toward the urethra and compresses it, there will be problems passing urine. What increases the risk? This condition is more likely to develop in men over the age of 55 years. What are the signs or symptoms? Symptoms of this condition include:  Getting up often during the night to urinate.  Needing to urinate frequently during the day.  Difficulty starting urine flow.  Decrease in size and strength of your urine stream.  Leaking (dribbling) after urinating.  Inability to pass urine. This needs  immediate treatment.  Inability to completely empty your bladder.  Pain when you pass urine. This is more common if there is also an infection.  Urinary tract infection (UTI). How is this diagnosed? This condition is diagnosed based on your medical history, a physical exam, and your symptoms. Tests will also be done, such as:  A post-void bladder scan. This measures any amount of urine that may remain in your bladder after you finish urinating.  A digital rectal exam. In a rectal exam, your health care provider checks your prostate by putting a lubricated, gloved finger into your rectum to feel the back of your prostate gland. This exam detects the size of your gland and any abnormal lumps or growths.  An exam of your urine (urinalysis).  A prostate specific antigen (PSA) screening. This is a blood test used to screen for prostate cancer.  An ultrasound. This test uses sound waves to electronically produce a picture of your prostate gland. Your health care provider may refer you to a specialist in kidney and prostate diseases (urologist). How is this treated? Once symptoms begin, your health care provider will monitor your condition (active surveillance or watchful waiting). Treatment for this condition will depend on the severity of your condition. Treatment may include:  Observation and yearly exams. This may be the only treatment needed if your condition and symptoms are mild.  Medicines to relieve your symptoms, including: ? Medicines to shrink the prostate. ? Medicines to relax the muscle of the prostate.  Surgery in severe cases. Surgery may include: ? Prostatectomy. In this procedure, the prostate tissue is removed completely through an open incision or with a laparoscope or robotics. ? Transurethral resection of the prostate (TURP). In this procedure, a tool is inserted through the opening at the tip of the penis (urethra). It is used to cut away tissue of the inner core of the  prostate. The pieces are removed through the same opening of the penis. This removes the blockage. ? Transurethral incision (TUIP). In this procedure, small cuts are made in the prostate. This lessens the prostate's pressure on the urethra. ? Transurethral microwave thermotherapy (TUMT). This procedure uses microwaves to create heat. The heat destroys and removes a small amount of prostate tissue. ? Transurethral needle ablation (TUNA). This procedure uses radio frequencies to destroy and remove a small amount of prostate tissue. ? Interstitial laser coagulation ([place]). This procedure uses a laser to destroy and remove a small amount of prostate tissue. ?  Transurethral electrovaporization (TUVP). This procedure uses electrodes to destroy and remove a small amount of prostate tissue. ? Prostatic urethral lift. This procedure inserts an implant to push the lobes of the prostate away from the urethra. Follow these instructions at home:  Take over-the-counter and prescription medicines only as told by your health care provider.  Monitor your symptoms for any changes. Contact your health care provider with any changes.  Avoid drinking large amounts of liquid before going to bed or out in public.  Avoid or reduce how much caffeine or alcohol you drink.  Give yourself time when you urinate.  Keep all follow-up visits as told by your health care provider. This is important. Contact a health care provider if:  You have unexplained back pain.  Your symptoms do not get better with treatment.  You develop side effects from the medicine you are taking.  Your urine becomes very dark or has a bad smell.  Your lower abdomen becomes distended and you have trouble passing your urine. Get help right away if:  You have a fever or chills.  You suddenly cannot urinate.  You feel lightheaded, or very dizzy, or you faint.  There are large amounts of blood or clots in the urine.  Your urinary problems  become hard to manage.  You develop moderate to severe low back or flank pain. The flank is the side of your body between the ribs and the hip. These symptoms may represent a serious problem that is an emergency. Do not wait to see if the symptoms will go away. Get medical help right away. Call your local emergency services (911 in the [place] Do not drive yourself to the hospital. Summary  Benign prostatic hyperplasia (BPH) is an enlarged prostate that is caused by the normal aging process and not by cancer.  An enlarged prostate can press on the urethra. This can make it hard to pass urine.  This condition is part of a normal aging process and is more likely to develop in men over the age of 61 years.  Get help right away if you suddenly cannot urinate. This information is not intended to replace advice given to you by your health care provider. Make sure you discuss any questions you have with your health care provider. Document Revised: 09/09/2018 Document Reviewed: 11/19/2016 Elsevier Patient Education  2020 [employer].

## 2019-12-16 ENCOUNTER — Other Ambulatory Visit: Payer: Self-pay

## 2019-12-16 ENCOUNTER — Ambulatory Visit (INDEPENDENT_AMBULATORY_CARE_PROVIDER_SITE_OTHER): Payer: PPO | Admitting: Family Medicine

## 2019-12-16 ENCOUNTER — Encounter: Payer: Self-pay | Admitting: Family Medicine

## 2019-12-16 VITALS — BP 164/90 | HR 64 | Temp 96.9°F | Ht 72.0 in | Wt 179.2 lb

## 2019-12-16 DIAGNOSIS — R8290 Unspecified abnormal findings in urine: Secondary | ICD-10-CM | POA: Diagnosis not present

## 2019-12-16 DIAGNOSIS — G250 Essential tremor: Secondary | ICD-10-CM

## 2019-12-16 DIAGNOSIS — N529 Male erectile dysfunction, unspecified: Secondary | ICD-10-CM

## 2019-12-16 DIAGNOSIS — R351 Nocturia: Secondary | ICD-10-CM

## 2019-12-16 DIAGNOSIS — R030 Elevated blood-pressure reading, without diagnosis of hypertension: Secondary | ICD-10-CM | POA: Diagnosis not present

## 2019-12-16 DIAGNOSIS — N401 Enlarged prostate with lower urinary tract symptoms: Secondary | ICD-10-CM

## 2019-12-16 DIAGNOSIS — R7301 Impaired fasting glucose: Secondary | ICD-10-CM | POA: Diagnosis not present

## 2019-12-16 DIAGNOSIS — G3184 Mild cognitive impairment, so stated: Secondary | ICD-10-CM

## 2019-12-16 DIAGNOSIS — E7800 Pure hypercholesterolemia, unspecified: Secondary | ICD-10-CM

## 2019-12-16 DIAGNOSIS — Z0000 Encounter for general adult medical examination without abnormal findings: Secondary | ICD-10-CM | POA: Diagnosis not present

## 2019-12-16 DIAGNOSIS — R350 Frequency of micturition: Secondary | ICD-10-CM | POA: Diagnosis not present

## 2019-12-16 LAB — POCT URINALYSIS DIP (PROADVANTAGE DEVICE)
Bilirubin, UA: NEGATIVE
Glucose, UA: NEGATIVE mg/dL
Ketones, POC UA: NEGATIVE mg/dL
Nitrite, UA: POSITIVE — AB
Protein Ur, POC: 30 mg/dL — AB
Specific Gravity, Urine: 1.025
Urobilinogen, Ur: NEGATIVE
pH, UA: 6 (ref 5.0–8.0)

## 2019-12-16 MED ORDER — TAMSULOSIN HCL 0.4 MG PO CAPS: 0.4000 mg | ORAL_CAPSULE | Freq: Every day | ORAL | 1 refills | Status: DC

## 2019-12-16 NOTE — Progress Notes (Signed)
Culture sent 

## 2019-12-19 LAB — URINE CULTURE

## 2019-12-21 ENCOUNTER — Other Ambulatory Visit: Payer: Self-pay | Admitting: *Deleted

## 2019-12-21 DIAGNOSIS — N400 Enlarged prostate without lower urinary tract symptoms: Secondary | ICD-10-CM

## 2019-12-21 DIAGNOSIS — N4830 Priapism, unspecified: Secondary | ICD-10-CM

## 2019-12-21 DIAGNOSIS — N529 Male erectile dysfunction, unspecified: Secondary | ICD-10-CM

## 2019-12-21 NOTE — Progress Notes (Signed)
Please advise pt that his urine culture shows the same antibiotic that was present on the last culture.  The first time he was symptomatic with infection, this time most of his (daytime) symptoms resolved.  Please see if he is having any dysuria, daytime urgency, lower abdominal pain, blood or odor to the urine.  If so, I might treat presumptively while waiting urology consult.  We had discussed urology consult for his BPH symptoms, ED and pain with erection, but elected to hold off and see if the prescribed med worked--but given persistently abnormal urine, I think we should send now. Please refer to [facility] (he prefers Dr. Diona Fanti, who his former wife used to see) and advise pt.  Thanks

## 2019-12-22 DIAGNOSIS — L821 Other seborrheic keratosis: Secondary | ICD-10-CM | POA: Diagnosis not present

## 2019-12-22 DIAGNOSIS — C44519 Basal cell carcinoma of skin of other part of trunk: Secondary | ICD-10-CM | POA: Diagnosis not present

## 2019-12-22 DIAGNOSIS — Z85828 Personal history of other malignant neoplasm of skin: Secondary | ICD-10-CM | POA: Diagnosis not present

## 2019-12-22 DIAGNOSIS — L853 Xerosis cutis: Secondary | ICD-10-CM | POA: Diagnosis not present

## 2019-12-22 DIAGNOSIS — D485 Neoplasm of uncertain behavior of skin: Secondary | ICD-10-CM | POA: Diagnosis not present

## 2019-12-22 DIAGNOSIS — L812 Freckles: Secondary | ICD-10-CM | POA: Diagnosis not present

## 2019-12-22 DIAGNOSIS — L570 Actinic keratosis: Secondary | ICD-10-CM | POA: Diagnosis not present

## 2019-12-22 DIAGNOSIS — D1801 Hemangioma of skin and subcutaneous tissue: Secondary | ICD-10-CM | POA: Diagnosis not present

## 2019-12-24 ENCOUNTER — Telehealth: Payer: Self-pay | Admitting: *Deleted

## 2019-12-24 NOTE — Telephone Encounter (Signed)
Patient advised.

## 2019-12-24 NOTE — Telephone Encounter (Signed)
Please advise pt to start using nasal saline spray regularly (to help keep the mucosa moist).  This is a better long-term option. Many people get nosebleeds related to the dry air in the winter. He can continue to use some vaseline as well, intermittently, if needed.

## 2019-12-24 NOTE — Telephone Encounter (Signed)
Patient called and said flomax is working very well for his urinary issue. Only problem is that he is having daily morning nosebleeds that clot up in his nose. He did put some petroleum jelly in his nose a few nights and this didn't happen, last night he didn't and it did happen. He is concerned though because he feels like putting petroleum jelly in his nose long term is not a good idea. Wanted to run this by you and see what you thought.

## 2019-12-31 DIAGNOSIS — H5203 Hypermetropia, bilateral: Secondary | ICD-10-CM | POA: Diagnosis not present

## 2019-12-31 DIAGNOSIS — D3132 Benign neoplasm of left choroid: Secondary | ICD-10-CM | POA: Diagnosis not present

## 2020-01-04 ENCOUNTER — Other Ambulatory Visit: Payer: Self-pay | Admitting: Neurology
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
## 2020-01-19 NOTE — Progress Notes (Signed)
Chief Complaint  Eric Simmons presents with  . Follow-up    follow up on hypertension. Eric Simmons reports that blood pressures are all over the place and brought in his new machine to be verified. He feels like his energy has diminished. Since starting tamsulosin he is getting up to urinate 2-3 times per night instead of 4-5. And his nosebleeds are better since starting to use saline nasal spray as recommended. He is seeing Dr. Diona Fanti on April 7th, 2021.     Eric Simmons presents to follow up on his blood pressure.  At his physical in February his BP was noted to be elevated.  He was advised to follow low sodium diet, to get a BP monitor, check BP regularly, and to bring the monitor and list of BP's to his visit.  BP Readings from Last 3 Encounters:  12/16/19 (!) 164/90  11/09/19 (!) 150/90  06/19/19 (!) 168/84   Continues to walk 2 miles 4x/week. Same exercises at the [facility]. No changes to sodium in diet-- +soups (canned tomato basil), lunch meats 3-4x/week, +ham.  BP list: Through 3/7:  129/77 up to 197/91. Mostly 160-175, some lower and some higher. 2nd page-- (3/8 to present) Range 135/71-166/91 (one time 193/102 night of 3/22) Mostly 140-170.  At that visit he was also started on tamsulosin for BPH symptoms, and referred to urology as well.  The medication has improved his urinary symptoms. Prior to starting med he reported: decreased stream, some intermittent hesitancy, some sporadic dribbling, and nocturia (up to 5x/night, typically 2-3x). Empties bladder well only sometimes, not as well as in the past. Currently her reports: Stream still sprays some, less hesitancy, and less nocturia Scheduled to see urologist on 4/7  He had reported getting frequent nosebleeds. This has resolved with saline   PMH, PSH, SH reviewed  Outpatient Encounter Medications as of 01/20/2020  Medication Sig Note  . cholecalciferol (VITAMIN D) 1000 units tablet Take 1,000 Units by mouth daily.   Marland Kitchen Co-Enzyme  Q-10 100 MG CAPS Take 300 mg by mouth daily.   . Cyanocobalamin (VITAMIN B 12 PO) Take 1 capsule by mouth daily.   . Glucosamine-Chondroit-Vit C-Mn (GLUCOSAMINE 1500 COMPLEX) CAPS Take 1 capsule by mouth daily.   . primidone (MYSOLINE) 50 MG tablet TAKE ONE TABLET AT BEDTIME   . simvastatin (ZOCOR) 40 MG tablet TAKE ONE TABLET AT BEDTIME   . tamsulosin (FLOMAX) 0.4 MG CAPS capsule Take 1 capsule (0.4 mg total) by mouth daily.   Marland Kitchen acetaminophen (TYLENOL) 500 MG tablet Take 500 mg by mouth 2 (two) times daily. Reported on 12/28/2015 06/22/2015: Uses prn pain, not often  . sildenafil (REVATIO) 20 MG tablet TAKE 2 TO 5 TABLETS BY MOUTH AS NEEDED (Eric Simmons not taking: Reported on 12/16/2019)    Facility-Administered Encounter Medications as of 01/20/2020  Medication  . 0.9 %  sodium chloride infusion   Allergies  Allergen Reactions  . Codeine     REACTION: violent vomiting    ROS:  No fever, chills, URI symptoms, cough, shortness of breath, chest pain, palpitations, headaches, dizziness, syncope.  No nausea, vomiting, bowel changes. Nose bleeds resolved Urinary frequency improved ED--hasn't been taking medications, so can't say whether or not erections are still painful.  Awaiting urology evaluation. +fatigue.    PHYSICAL EXAM:  BP 130/80   Pulse 64   Temp 97.8 F (36.6 C) (Other (Comment))   Ht 6' (1.829 m)   Wt 177 lb 9.6 oz (80.6 kg)   BMI 24.09 kg/m  Wt Readings from Last 3 Encounters:  12/16/19 179 lb 3.2 oz (81.3 kg)  11/09/19 177 lb 12.8 oz (80.6 kg)  06/19/19 172 lb (78 kg)   BP monitor checked today, and accurate (<10 points higher than that gotten by nurse).  Well-appearing, pleasant, elderly male, in good spirits HEENT: conjunctiva and sclera are clear, EOMI Neck: no lymphadenopathy or mass Heart: regular rate and rhythm Lungs: clear bilaterally Abdomen: soft, nontender Extremities: no edema Neuro: alert and oriented, normal gait Psych: normal mood, affect,  hygiene and grooming   ASSESSMENT/PLAN:  Essential hypertension - BP's (w/verified monitor) frequently high at home, okay in office today. Suspect Na is a big factor in elevations. Further trial of diet to control BP  Benign prostatic hyperplasia, unspecified whether lower urinary tract symptoms present - some improvement, cont meds. f/u with urologist as scheduled, also with regard to ED and pain with erection.  He meets criteria to diagnose HTN. There are wide fluctuations in his values, and today's is good. Rather than starting a medication today, we had a long discussion about cutting back salt in his diet, and seeing if that got BP down, and if he was able to be compliant with that diet. He will drop off/mail another list of BP's within the month.  He will be asked to schedule OV if they remain above goal, to discuss medications.  Diuretic wouldn't be a good option due to his urinary complaints.  Beta blockers were avoided in treating his tremor due to prior issues with bradycardia.  He didn't have many pulses written down on his BP log he brought in today (60's-70), though perhaps might tolerate low dose if P ok.  Advised to record pulses.  25 min FTF visit with more than 1/2 spent counseling, plus additional time for chart review and documentation.  Drop off BP log in 1 month; OV only if abnormal. Med check Aug/Sept  CPE 11/2020   Your diet definitely is high in sodium, which is likely contributing to the higher blood pressures, and the wide fluctuations (better when diet is better). Please cut back on canned foods, read labels for sodium content. Buy low sodium meats from the deli. Ham and other processed meats are high in salt. Avoid salted crackers, nuts, pretzels, olives, pickles.  Given that your blood pressure is fine today, and you're already reporting some fatigue, let's hold off on starting a blood pressure medication until we can review your blood pressures after  modifying and cutting back on the salt in your diet. If quality of life is terribly affected by cutting out the salt, we can start a medication.   ASSESSMENT/PLAN:  DID HE SEE UROLOGY YET??

## 2020-01-20 ENCOUNTER — Encounter: Payer: Self-pay | Admitting: Family Medicine

## 2020-01-20 ENCOUNTER — Other Ambulatory Visit: Payer: Self-pay

## 2020-01-20 ENCOUNTER — Ambulatory Visit (INDEPENDENT_AMBULATORY_CARE_PROVIDER_SITE_OTHER): Payer: PPO | Admitting: Family Medicine

## 2020-01-20 VITALS — BP 130/80 | HR 64 | Temp 97.8°F | Ht 72.0 in | Wt 177.6 lb

## 2020-01-20 DIAGNOSIS — I10 Essential (primary) hypertension: Secondary | ICD-10-CM

## 2020-01-20 DIAGNOSIS — N400 Enlarged prostate without lower urinary tract symptoms: Secondary | ICD-10-CM | POA: Diagnosis not present

## 2020-01-20 NOTE — Patient Instructions (Addendum)
Your diet definitely is high in sodium, which is likely contributing to the higher blood pressures, and the wide fluctuations (better when diet is better). Please cut back on canned foods, read labels for sodium content. Buy low sodium meats from the deli. Ham and other processed meats are high in salt. Avoid salted crackers, nuts, pretzels, olives, pickles.  Given that your blood pressure is fine today, and you're already reporting some fatigue, let's hold off on starting a blood pressure medication until we can review your blood pressures after modifying and cutting back on the salt in your diet. If quality of life is terribly affected by cutting out the salt, we can start a medication.    DASH Eating Plan DASH stands for "Dietary Approaches to Stop Hypertension." The DASH eating plan is a healthy eating plan that has been shown to reduce high blood pressure (hypertension). It may also reduce your risk for type 2 diabetes, heart disease, and stroke. The DASH eating plan may also help with weight loss. What are tips for following this plan?  General guidelines  Avoid eating more than 2,300 mg (milligrams) of salt (sodium) a day. If you have hypertension, you may need to reduce your sodium intake to 1,500 mg a day.  Limit alcohol intake to no more than 1 drink a day for nonpregnant women and 2 drinks a day for men. One drink equals 12 oz of beer, 5 oz of wine, or 1 oz of hard liquor.  Work with your health care provider to maintain a healthy body weight or to lose weight. Ask what an ideal weight is for you.  Get at least 30 minutes of exercise that causes your heart to beat faster (aerobic exercise) most days of the week. Activities may include walking, swimming, or biking.  Work with your health care provider or diet and nutrition specialist (dietitian) to adjust your eating plan to your individual calorie needs. Reading food labels   Check food labels for the amount of sodium per  serving. Choose foods with less than 5 percent of the Daily Value of sodium. Generally, foods with less than 300 mg of sodium per serving fit into this eating plan.  To find whole grains, look for the word "whole" as the first word in the ingredient list. Shopping  Buy products labeled as "low-sodium" or "no salt added."  Buy fresh foods. Avoid canned foods and premade or frozen meals. Cooking  Avoid adding salt when cooking. Use salt-free seasonings or herbs instead of table salt or sea salt. Check with your health care provider or pharmacist before using salt substitutes.  Do not fry foods. Cook foods using healthy methods such as baking, boiling, grilling, and broiling instead.  Cook with heart-healthy oils, such as olive, canola, soybean, or sunflower oil. Meal planning  Eat a balanced diet that includes: ? 5 or more servings of fruits and vegetables each day. At each meal, try to fill half of your plate with fruits and vegetables. ? Up to 6-8 servings of whole grains each day. ? Less than 6 oz of lean meat, poultry, or fish each day. A 3-oz serving of meat is about the same size as a deck of cards. One egg equals 1 oz. ? 2 servings of low-fat dairy each day. ? A serving of nuts, seeds, or beans 5 times each week. ? Heart-healthy fats. Healthy fats called Omega-3 fatty acids are found in foods such as flaxseeds and coldwater fish, like sardines, salmon, and mackerel.  Limit how much you eat of the following: ? Canned or prepackaged foods. ? Food that is high in trans fat, such as fried foods. ? Food that is high in saturated fat, such as fatty meat. ? Sweets, desserts, sugary drinks, and other foods with added sugar. ? Full-fat dairy products.  Do not salt foods before eating.  Try to eat at least 2 vegetarian meals each week.  Eat more home-cooked food and less restaurant, buffet, and fast food.  When eating at a restaurant, ask that your food be prepared with less salt or  no salt, if possible. What foods are recommended? The items listed may not be a complete list. Talk with your dietitian about what dietary choices are best for you. Grains Whole-grain or whole-wheat bread. Whole-grain or whole-wheat pasta. Brown rice. [place]. Bulgur. Whole-grain and low-sodium cereals. Pita bread. Low-fat, low-sodium crackers. Whole-wheat flour tortillas. Vegetables Fresh or frozen vegetables (raw, steamed, roasted, or grilled). Low-sodium or reduced-sodium tomato and vegetable juice. Low-sodium or reduced-sodium tomato sauce and tomato paste. Low-sodium or reduced-sodium canned vegetables. Fruits All fresh, dried, or frozen fruit. Canned fruit in natural juice (without added sugar). Meat and other protein foods Skinless chicken or [place]. Ground chicken or [place]. Pork with fat trimmed off. Fish and seafood. Egg whites. Dried beans, peas, or lentils. Unsalted nuts, nut butters, and seeds. Unsalted canned beans. Lean cuts of beef with fat trimmed off. Low-sodium, lean deli meat. Dairy Low-fat (1%) or fat-free (skim) milk. Fat-free, low-fat, or reduced-fat cheeses. Nonfat, low-sodium ricotta or cottage cheese. Low-fat or nonfat yogurt. Low-fat, low-sodium cheese. Fats and oils Soft margarine without trans fats. Vegetable oil. Low-fat, reduced-fat, or light mayonnaise and salad dressings (reduced-sodium). Canola, safflower, olive, soybean, and sunflower oils. Avocado. Seasoning and other foods Herbs. Spices. Seasoning mixes without salt. Unsalted popcorn and pretzels. Fat-free sweets. What foods are not recommended? The items listed may not be a complete list. Talk with your dietitian about what dietary choices are best for you. Grains Baked goods made with fat, such as croissants, muffins, or some breads. Dry pasta or rice meal packs. Vegetables Creamed or fried vegetables. Vegetables in a cheese sauce. Regular canned vegetables (not low-sodium or reduced-sodium).  Regular canned tomato sauce and paste (not low-sodium or reduced-sodium). Regular tomato and vegetable juice (not low-sodium or reduced-sodium). Angie Fava. Olives. Fruits Canned fruit in a light or heavy syrup. Fried fruit. Fruit in cream or butter sauce. Meat and other protein foods Fatty cuts of meat. Ribs. Fried meat. Berniece Salines. Sausage. Bologna and other processed lunch meats. Salami. Fatback. Hotdogs. Bratwurst. Salted nuts and seeds. Canned beans with added salt. Canned or smoked fish. Whole eggs or egg yolks. Chicken or [place] with skin. Dairy Whole or 2% milk, cream, and half-and-half. Whole or full-fat cream cheese. Whole-fat or sweetened yogurt. Full-fat cheese. Nondairy creamers. Whipped toppings. Processed cheese and cheese spreads. Fats and oils Butter. Stick margarine. Lard. Shortening. Ghee. Bacon fat. Tropical oils, such as coconut, palm kernel, or palm oil. Seasoning and other foods Salted popcorn and pretzels. Onion salt, garlic salt, seasoned salt, table salt, and sea salt. Worcestershire sauce. Tartar sauce. Barbecue sauce. Teriyaki sauce. Soy sauce, including reduced-sodium. Steak sauce. Canned and packaged gravies. Fish sauce. Oyster sauce. Cocktail sauce. Horseradish that you find on the shelf. Ketchup. Mustard. Meat flavorings and tenderizers. Bouillon cubes. Hot sauce and Tabasco sauce. Premade or packaged marinades. Premade or packaged taco seasonings. Relishes. Regular salad dressings. Where to find more information:  National Heart, Lung, and Blood  Institute: https://wilson-eaton.com/  American Heart Association: www.heart.org Summary  The DASH eating plan is a healthy eating plan that has been shown to reduce high blood pressure (hypertension). It may also reduce your risk for type 2 diabetes, heart disease, and stroke.  With the DASH eating plan, you should limit salt (sodium) intake to 2,300 mg a day. If you have hypertension, you may need to reduce your sodium intake to 1,500 mg  a day.  When on the DASH eating plan, aim to eat more fresh fruits and vegetables, whole grains, lean proteins, low-fat dairy, and heart-healthy fats.  Work with your health care provider or diet and nutrition specialist (dietitian) to adjust your eating plan to your individual calorie needs. This information is not intended to replace advice given to you by your health care provider. Make sure you discuss any questions you have with your health care provider. Document Revised: 09/27/2017 Document Reviewed: 10/08/2016 Elsevier Patient Education  2020 [employer].

## 2020-02-02 DIAGNOSIS — S93402A Sprain of unspecified ligament of left ankle, initial encounter: Secondary | ICD-10-CM | POA: Diagnosis not present

## 2020-02-02 DIAGNOSIS — M25572 Pain in left ankle and joints of left foot: Secondary | ICD-10-CM | POA: Diagnosis not present

## 2020-02-03 DIAGNOSIS — R351 Nocturia: Secondary | ICD-10-CM | POA: Diagnosis not present

## 2020-02-03 DIAGNOSIS — N5201 Erectile dysfunction due to arterial insufficiency: Secondary | ICD-10-CM | POA: Diagnosis not present

## 2020-02-03 DIAGNOSIS — N401 Enlarged prostate with lower urinary tract symptoms: Secondary | ICD-10-CM | POA: Diagnosis not present

## 2020-02-03 DIAGNOSIS — N486 Induration penis plastica: Secondary | ICD-10-CM | POA: Diagnosis not present

## 2020-02-03 DIAGNOSIS — N3000 Acute cystitis without hematuria: Secondary | ICD-10-CM | POA: Diagnosis not present

## 2020-02-25 DIAGNOSIS — M1612 Unilateral primary osteoarthritis, left hip: Secondary | ICD-10-CM | POA: Diagnosis not present

## 2020-03-07 ENCOUNTER — Other Ambulatory Visit: Payer: Self-pay | Admitting: Family Medicine

## 2020-03-07 DIAGNOSIS — E7800 Pure hypercholesterolemia, unspecified: Secondary | ICD-10-CM

## 2020-03-09 ENCOUNTER — Ambulatory Visit (INDEPENDENT_AMBULATORY_CARE_PROVIDER_SITE_OTHER): Payer: PPO | Admitting: Family Medicine

## 2020-03-09 ENCOUNTER — Other Ambulatory Visit: Payer: Self-pay

## 2020-03-09 ENCOUNTER — Encounter: Payer: Self-pay | Admitting: Family Medicine

## 2020-03-09 VITALS — BP 150/84 | HR 72 | Temp 97.5°F | Ht 72.0 in | Wt 176.4 lb

## 2020-03-09 DIAGNOSIS — R350 Frequency of micturition: Secondary | ICD-10-CM | POA: Diagnosis not present

## 2020-03-09 DIAGNOSIS — R0600 Dyspnea, unspecified: Secondary | ICD-10-CM

## 2020-03-09 DIAGNOSIS — I10 Essential (primary) hypertension: Secondary | ICD-10-CM

## 2020-03-09 DIAGNOSIS — R0609 Other forms of dyspnea: Secondary | ICD-10-CM

## 2020-03-09 LAB — POCT URINALYSIS DIP (PROADVANTAGE DEVICE)
Bilirubin, UA: NEGATIVE
Blood, UA: NEGATIVE
Glucose, UA: NEGATIVE mg/dL
Ketones, POC UA: NEGATIVE mg/dL
Leukocytes, UA: NEGATIVE
Nitrite, UA: NEGATIVE
Protein Ur, POC: NEGATIVE mg/dL
Specific Gravity, Urine: 1.025
Urobilinogen, Ur: NEGATIVE
pH, UA: 5.5 (ref 5.0–8.0)

## 2020-03-09 MED ORDER — LOSARTAN POTASSIUM 50 MG PO TABS: ORAL_TABLET | ORAL | 0 refills | Status: DC

## 2020-03-09 NOTE — Patient Instructions (Signed)
Start the losartan at 1/2 tablet by mouth once daily. Increase to the full tablet after 1-2 weeks if your blood pressure remains over 140 (top number). Contact us if you have any side effects from it.  Go to [facility] tomorrow [phone] or [phone]) to get your chest x-ray.  We are referring you to cardiology for further evaluation of your shortness of breath with exertion, to include echocardiogram (ultrasound of the heart), and possibly stress test.

## 2020-03-09 NOTE — Progress Notes (Signed)
Chief Complaint  Patient presents with  . Shortness of Breath    saw Dr. Diona Fanti and he put him on macrobid and kept him on the flomax. SInce taking he feels like he has less energy, struggle to breathe when taking long walks. Has had some off and on chest pressure only when walking. Also brought in bp readings and he feels that they are higher than they should be.    Patient presents to discuss his continued elevated BP's at home, and new complaint of shortness of breath with walking, starting 2-3 weeks ago.  He denies headaches. No associated chest pain, but sometimes seems it feels a little tight. Feels better with rest, but takes a bit of time, not immediate. No claudication.  BP list reviewed--BP is 116-168/73-98, mosty running 140-150's/75-85 Pulse 59-84, mostly 60's-70 Never been on BP medications.  EKG from 11/2019 reviewed--bradycardia, PAC, LVH noted.  He had a severe L ankle sprain, swelled x 3 weeks. It is much better now.  Sees Dr. Diona Fanti Friday morning for cystoscopy He prescribed macrobid BID, but he cut back to once daily as it "made him uncomfortable"--slowed him down. No nausea, vomting, diarrhea, rash.  He was told there was an infection.  Still taking flomax. Urinary frequency is significantly improved. Up once or twice/night only. Denies dysuria, hematuria. Occasional dribble, improved.  PMH, PSH, SH reviewed  Outpatient Encounter Medications as of 03/09/2020  Medication Sig Note  . cholecalciferol (VITAMIN D) 1000 units tablet Take 1,000 Units by mouth daily.   Marland Kitchen Co-Enzyme Q-10 100 MG CAPS Take 300 mg by mouth daily.   . Cyanocobalamin (VITAMIN B 12 PO) Take 1 capsule by mouth daily.   . Glucosamine-Chondroit-Vit C-Mn (GLUCOSAMINE 1500 COMPLEX) CAPS Take 1 capsule by mouth daily.   . nitrofurantoin, macrocrystal-monohydrate, (MACROBID) 100 MG capsule Take 100 mg by mouth every 12 (twelve) hours. 03/09/2020: Only taking one daily  . primidone (MYSOLINE) 50 MG  tablet TAKE ONE TABLET AT BEDTIME   . simvastatin (ZOCOR) 40 MG tablet TAKE ONE TABLET AT BEDTIME   . tamsulosin (FLOMAX) 0.4 MG CAPS capsule Take 1 capsule (0.4 mg total) by mouth daily.   Marland Kitchen acetaminophen (TYLENOL) 500 MG tablet Take 500 mg by mouth 2 (two) times daily. Reported on 12/28/2015 06/22/2015: Uses prn pain, not often  . sildenafil (REVATIO) 20 MG tablet TAKE 2 TO 5 TABLETS BY MOUTH AS NEEDED (Patient not taking: Reported on 12/16/2019)    Facility-Administered Encounter Medications as of 03/09/2020  Medication  . 0.9 %  sodium chloride infusion   Allergies  Allergen Reactions  . Codeine     REACTION: violent vomiting   ROS: no fever, chills, URI symptoms.  Ankle pain resolved.  +DOE per HPI. Denies headaches, dizziness, chest pain, palpitations, nausea, vomiting, bowel changes.  Denies bleeding, bruising, rash.  Nocturia improved, denies dysuria.  Occasional dribbling, overall improved.  Moods are good.  +tremor, stable   PHYSICAL EXAM:  BP (!) 160/90   Pulse 72   Temp (!) 97.5 F (36.4 C) (Tympanic)   Ht 6' (1.829 m)   Wt 176 lb 6.4 oz (80 kg)   SpO2 98%   BMI 23.92 kg/m   150/84 on repeat by MD  Well-appearing, pleasant, elderly male in good spirits HEENT: conjunctiva and sclera are clear, EOMI Neck: no lymphadenopathy, thyromegaly or carotid bruit Heart: regular rate and rhythm, no murmur Lungs: clear bilaterally Abdomen: soft, nontender, no mass Extremities: no edema Neuro: alert and oriented, cranial nerves intact.  Tremor Psych: normal mood, affect, hygiene and grooming   EKG: NSR, rate 60.  Borderline LAE. LVH noted.  Urine dip normal  ASSESSMENT/PLAN:  Essential hypertension - Start losartan at 49m, increase to 512mif BP remains elevated after 1-2 wks. Low Na diet. f/u 4 wks with b-met and OV - Plan: losartan (COZAAR) 50 MG tablet, Ambulatory referral to Cardiology, EKG 12-Lead  Dyspnea on exertion - No e/o CHF, concern for ischemia. Refer to  cardiology for evaluation - Plan: DG Chest 2 View, Ambulatory referral to Cardiology, EKG 12-Lead  Urinary frequency - improved with flomax and treating infection. recheck of urine normal today (done due to taking ABX differently than rx'd, with procedure in 2d - Plan: POCT Urinalysis DIP (Proadvantage Device)  [place] [facility] for CXR tomorrow. Losartan 5048mstart at 1/2 tablet once daily. If your blood pressure remains over 140295stolic after 1-2 weeks, then increase to the full tablet. You will need your blood test repeated in 3-4 weeks. We can do that here, or the cardiologists can depending on when your visit with them is.  4 week f/u here.

## 2020-03-10 ENCOUNTER — Ambulatory Visit
Admission: RE | Admit: 2020-03-10 | Discharge: 2020-03-10 | Disposition: A | Discharge status: Home / Self Care | Payer: PPO | Source: Ambulatory Visit | Attending: Family Medicine | Admitting: Family Medicine

## 2020-03-10 DIAGNOSIS — R0609 Other forms of dyspnea: Secondary | ICD-10-CM

## 2020-03-10 DIAGNOSIS — R0600 Dyspnea, unspecified: Secondary | ICD-10-CM | POA: Diagnosis not present

## 2020-03-11 DIAGNOSIS — N401 Enlarged prostate with lower urinary tract symptoms: Secondary | ICD-10-CM | POA: Diagnosis not present

## 2020-03-11 DIAGNOSIS — N486 Induration penis plastica: Secondary | ICD-10-CM | POA: Diagnosis not present

## 2020-03-11 DIAGNOSIS — R351 Nocturia: Secondary | ICD-10-CM | POA: Diagnosis not present

## 2020-03-11 DIAGNOSIS — N3000 Acute cystitis without hematuria: Secondary | ICD-10-CM | POA: Diagnosis not present

## 2020-03-17 ENCOUNTER — Encounter: Payer: Self-pay | Admitting: Cardiology

## 2020-03-17 ENCOUNTER — Other Ambulatory Visit: Payer: Self-pay

## 2020-03-17 ENCOUNTER — Ambulatory Visit: Payer: PPO | Admitting: Cardiology

## 2020-03-17 VITALS — BP 162/82 | HR 71 | Ht 72.0 in | Wt 174.8 lb

## 2020-03-17 DIAGNOSIS — E7800 Pure hypercholesterolemia, unspecified: Secondary | ICD-10-CM | POA: Diagnosis not present

## 2020-03-17 DIAGNOSIS — R0602 Shortness of breath: Secondary | ICD-10-CM | POA: Diagnosis not present

## 2020-03-17 DIAGNOSIS — R072 Precordial pain: Secondary | ICD-10-CM

## 2020-03-17 DIAGNOSIS — I10 Essential (primary) hypertension: Secondary | ICD-10-CM | POA: Diagnosis not present

## 2020-03-17 MED ORDER — METOPROLOL SUCCINATE ER 25 MG PO TB24
25.0000 mg | ORAL_TABLET | Freq: Every day | ORAL | 3 refills | Status: DC
Start: 2020-03-17 — End: 2021-04-07

## 2020-03-17 NOTE — Progress Notes (Signed)
Cardiology Office Note    Date:  03/17/2020   ID:  Eric Simmons., DOB 1941/05/05, MRN [id number]  PCP:  [name], MD  Cardiologist:  [name], MD   Chief Complaint  Patient presents with  . New Patient (Initial Visit)    HTN and DOE    History of Present Illness:  Eric Simmons. is a 79 y.o. male who is being seen today for the evaluation of DOE and HTN at the request of [name], MD.  This is an 79yo male with a hx of HLD who recently saw his PCP and complained of DOE.  He tells me that he recently has noticed that his BP is elevated when he exerts himself.  Around the same time he started noticing DOE and chest discomfort with exertion as well.  He used to be an avid walking and walked 2 miles 4 times weekly but recently has noticed DOE and chest tightness working in the yard and walking and has now been able to walk over the past 1-2 months. He gets pressure in his chest with exertional activities but no radiation of the discomfort.  There is no associated diaphoresis but sometimes he gets nauseated.  He denies any PND, orthopnea, LE edema, dizziness, palpitations or syncope.  He has a very remote hx of tobacco and smoked as a teenager.  His mother died of colon CA and his father died at 58 of a massive MI.  He is now here for cardiac eval.    Past Medical History:  Diagnosis Date  . Adenomatous colon polyp 6/06   tubular adenoma, Dr. [name]  . BCC (basal cell carcinoma), face 03/2006   left face, R ear (04/2014), nose 05/2016  . Benign familial tremor 08/29/2012  . Erectile dysfunction   . Hemorrhoids    internal and external  . Hip arthritis    left  . IFG (impaired fasting glucose) 12/10/2012   Glucose 111 11/2012   . Impaired fasting glucose    (since his 20's; had yearly GTT's for 5 yrs, never progressed)  . Insomnia   . Mild neurocognitive disorder 09/11/2019  . Pseudogout of knee 10/04   left  . Pure hypercholesterolemia     Past Surgical  History:  Procedure Laterality Date  . COLONOSCOPY  6/06, 05/2010   Dr. [name]  . MOHS SURGERY  6/07   L face, Dr. Link Snuffer  . POLYPECTOMY    . TONSILLECTOMY    . VASECTOMY      Current Medications: Current Meds  Medication Sig  . acetaminophen (TYLENOL) 500 MG tablet Take 500 mg by mouth as needed. Reported on 12/28/2015  . cholecalciferol (VITAMIN D) 1000 units tablet Take 1,000 Units by mouth daily.  Marland Kitchen Co-Enzyme Q-10 100 MG CAPS Take 300 mg by mouth daily.  . Cyanocobalamin (VITAMIN B 12 PO) Take 1 capsule by mouth daily.  . Glucosamine-Chondroit-Vit C-Mn (GLUCOSAMINE 1500 COMPLEX) CAPS Take 1 capsule by mouth daily.  Marland Kitchen losartan (COZAAR) 50 MG tablet Take 1/2 tablet by mouth once daily. Increase to full tablet after 1-2 weeks if blood pressure remains over [id number] systolic  . primidone (MYSOLINE) 50 MG tablet TAKE ONE TABLET AT BEDTIME  . sildenafil (VIAGRA) 100 MG tablet Take 100 mg by mouth as needed.  . simvastatin (ZOCOR) 40 MG tablet TAKE ONE TABLET AT BEDTIME  . tamsulosin (FLOMAX) 0.4 MG CAPS capsule Take 1 capsule (0.4 mg total) by mouth daily.   Current Facility-Administered  Medications for the 03/17/20 encounter (Office Visit) with [name], MD  Medication  . 0.9 %  sodium chloride infusion    Allergies:   Codeine   Social History   Socioeconomic History  . Marital status: Widowed    Spouse name: Not on file  . Number of children: 2  . Years of education: 16  . Highest education level: Bachelor's degree (e.g., BA, AB, BS)  Occupational History  . Occupation: retired  Tobacco Use  . Smoking status: Former Smoker    Types: Cigars  . Smokeless tobacco: Never Used  . Tobacco comment: as a teenager  Substance and Sexual Activity  . Alcohol use: Yes    Alcohol/week: 0.0 standard drinks    Comment: 5-6 ounces of [place] wine or beer 5-6 x/week   . Drug use: No  . Sexual activity: Yes  Other Topics Concern  . Not on file  Social History Narrative   Widowed  2013 (after wife's long battle with MS).  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  Children live in [place], [place] and [place].   Married March 2016 [name])   1 dog.   Social Determinants of Health   Financial Resource Strain:   . Difficulty of Paying Living Expenses:   Food Insecurity:   . Worried About Charity fundraiser in the Last Year:   . Arboriculturist in the Last Year:   Transportation Needs:   . Film/video editor (Medical):   Marland Kitchen Lack of Transportation (Non-Medical):   Physical Activity:   . Days of Exercise per Week:   . Minutes of Exercise per Session:   Stress:   . Feeling of Stress :   Social Connections:   . Frequency of Communication with Friends and Family:   . Frequency of Social Gatherings with Friends and Family:   . Attends Religious Services:   . Active Member of Clubs or Organizations:   . Attends Archivist Meetings:   Marland Kitchen Marital Status:      Family History:  The patient's family history includes Cancer (age of onset: 2) in his mother; Colon cancer in his mother; Colon polyps in his mother; Diabetes in his mother; Heart disease in his brother, father, and son; Hyperlipidemia in his brother, brother, brother, son, and son; Hypertension in his father; Sleep apnea in his son; Syncope episode in his son; Tremor in his brother and brother.   ROS:   Please see the history of present illness.    ROS All other systems reviewed and are negative.  No flowsheet data found.     PHYSICAL EXAM:   VS:  BP (!) 162/82   Pulse 71   Ht 6' (1.829 m)   Wt 174 lb 12.8 oz (79.3 kg)   SpO2 96%   BMI 23.71 kg/m    GEN: Well nourished, well developed, in no acute distress  HEENT: normal  Neck: no JVD, carotid bruits, or masses Cardiac: RRR; no murmurs, rubs, or gallops,no edema.  Intact distal pulses bilaterally.  Respiratory:  clear to auscultation  bilaterally, normal work of breathing GI: soft, nontender, nondistended, + BS MS: no deformity or atrophy  Skin: warm and dry, no rash Neuro:  Alert and Oriented x 3, Strength and sensation are intact Psych: euthymic mood, full affect  Wt Readings from Last 3 Encounters:  03/17/20 174 lb 12.8 oz (79.3  kg)  03/09/20 176 lb 6.4 oz (80 kg)  01/20/20 177 lb 9.6 oz (80.6 kg)      Studies/Labs Reviewed:   EKG:  EKG is ordered today.  The ekg ordered today demonstrates NSR with LVH and repol abnormality  Recent Labs: 12/09/2019: ALT 21; BUN 14; Creatinine, Ser 1.24; Potassium 4.8; Sodium 141   Lipid Panel    Component Value Date/Time   CHOL 149 12/09/2019 0836   TRIG 108 12/09/2019 0836   HDL 47 12/09/2019 0836   CHOLHDL 3.2 12/09/2019 0836   CHOLHDL 2.9 10/30/2017 0944   VLDL 17 08/20/2016 1051   LDLCALC 82 12/09/2019 0836   LDLCALC 80 10/30/2017 0944    Additional studies/ records that were reviewed today include:  Office notes from PCP and EKG    ASSESSMENT:    1. SOB (shortness of breath)   2. Precordial pain   3. Essential hypertension   4. Pure hypercholesterolemia      PLAN:  In order of problems listed above:  1. SOB/Chest pain -this could be from poorly controlled HTN and diastolic dysfunction but I am more concerned about coronary ischemia -EKG shows LVH with repol abnormality -He has a fm hx of his father dying of a massive MI in his 13's.   -he does have a hx of HLD -plan for Lexiscan myoview to rule out ischemia -check 2D echo to assess LVF and diastolic function  2.  HTN -continue Losartan 25mg  daily -add Toprol XL 25mg  daily -followup with PA in 2 weeks for BP check  3.  HLD -followed by PCP -continue Simvastatin  Medication Adjustments/Labs and Tests Ordered: Current medicines are reviewed at length with the patient today.  Concerns regarding medicines are outlined above.  Medication changes, Labs and Tests ordered today are listed in the  Patient Instructions below.  Patient Instructions  Medication Instructions:  Your physician has recommended you make the following change in your medication:  1) START taking Toprol XL 25 mg daily   *If you need a refill on your cardiac medications before your next appointment, please call your pharmacy*  Testing/Procedures: Your physician has requested that you have an echocardiogram. Echocardiography is a painless test that uses sound waves to create images of your heart. It provides your doctor with information about the size and shape of your heart and how well your heart's chambers and valves are working. This procedure takes approximately one hour. There are no restrictions for this procedure.  Your physician has requested that you have a lexiscan myoview. For further information please visit [email or web address]. Please follow instruction sheet, as given.  Follow-Up: At [facility], you and your health needs are our priority.  As part of our continuing mission to provide you with exceptional heart care, we have created designated Provider Care Teams.  These Care Teams include your primary Cardiologist (physician) and Advanced Practice Providers (APPs -  Physician Assistants and Nurse Practitioners) who all work together to provide you with the care you need, when you need it.  Your next appointment:   2 week(s)  The format for your next appointment:   In Person  Provider:   Melina Copa, PA-C or Ermalinda Barrios, PA-C  Follow up with Dr. Radford Pax in 3 months.      Signed, [name], MD  03/17/2020 2:28 PM    [facility] [place], [place], [place]  [address] Phone: [phone]; Fax: [phone]

## 2020-03-17 NOTE — Patient Instructions (Signed)
Medication Instructions:  Your physician has recommended you make the following change in your medication:  1) START taking Toprol XL 25 mg daily   *If you need a refill on your cardiac medications before your next appointment, please call your pharmacy*  Testing/Procedures: Your physician has requested that you have an echocardiogram. Echocardiography is a painless test that uses sound waves to create images of your heart. It provides your doctor with information about the size and shape of your heart and how well your heart's chambers and valves are working. This procedure takes approximately one hour. There are no restrictions for this procedure.  Your physician has requested that you have a lexiscan myoview. For further information please visit [email or web address]. Please follow instruction sheet, as given.  Follow-Up: At [facility], you and your health needs are our priority.  As part of our continuing mission to provide you with exceptional heart care, we have created designated Provider Care Teams.  These Care Teams include your primary Cardiologist (physician) and Advanced Practice Providers (APPs -  Physician Assistants and Nurse Practitioners) who all work together to provide you with the care you need, when you need it.  Your next appointment:   2 week(s)  The format for your next appointment:   In Person  Provider:   Melina Copa, PA-C or Ermalinda Barrios, PA-C  Follow up with Dr. Radford Pax in 3 months.

## 2020-03-31 ENCOUNTER — Telehealth (HOSPITAL_COMMUNITY): Payer: Self-pay

## 2020-03-31 NOTE — Progress Notes (Deleted)
Cardiology Office Note    Date:  03/31/2020   ID:  Miachel Simmons., DOB [birth year], MRN [id number]  PCP:  [name], MD  Cardiologist: [name], MD EPS: None  No chief complaint on file.   History of Present Illness:  Eric Simmons. is a 79 y.o. male with history of HLD saw Dr. Radford Pax 03/17/20 for DOE, some chest pressure and HTN with exertion, father died massive MI in his 25's. It was felt he could have ischemia vs poorly controlled HTN and diastolic dysfunction. Toprol added. 2Decho and Lexiscan ordered.  Yesterday low risk normal LVEF Echo pending    Past Medical History:  Diagnosis Date  . Adenomatous colon polyp 6/06   tubular adenoma, Dr. [name]  . BCC (basal cell carcinoma), face 03/2006   left face, R ear (04/2014), nose 05/2016  . Benign familial tremor 08/29/2012  . Erectile dysfunction   . Hemorrhoids    internal and external  . Hip arthritis    left  . IFG (impaired fasting glucose) 12/10/2012   Glucose 111 11/2012   . Impaired fasting glucose    (since his 20's; had yearly GTT's for 5 yrs, never progressed)  . Insomnia   . Mild neurocognitive disorder 09/11/2019  . Pseudogout of knee 10/04   left  . Pure hypercholesterolemia     Past Surgical History:  Procedure Laterality Date  . COLONOSCOPY  6/06, 05/2010   Dr. [name]  . MOHS SURGERY  6/07   L face, Dr. Link Snuffer  . POLYPECTOMY    . TONSILLECTOMY    . VASECTOMY      Current Medications: No outpatient medications have been marked as taking for the 04/12/20 encounter (Appointment) with Imogene Burn, PA-C.   Current Facility-Administered Medications for the 04/12/20 encounter (Appointment) with Imogene Burn, PA-C  Medication  . 0.9 %  sodium chloride infusion     Allergies:   Codeine   Social History   Socioeconomic History  . Marital status: Widowed    Spouse name: Not on file  . Number of children: 2  . Years of education: 16  . Highest education level: Bachelor's  degree (e.g., BA, AB, BS)  Occupational History  . Occupation: retired  Tobacco Use  . Smoking status: Former Smoker    Types: Cigars  . Smokeless tobacco: Never Used  . Tobacco comment: as a teenager  Substance and Sexual Activity  . Alcohol use: Yes    Alcohol/week: 0.0 standard drinks    Comment: 5-6 ounces of [place] wine or beer 5-6 x/week   . Drug use: No  . Sexual activity: Yes  Other Topics Concern  . Not on file  Social History Narrative   Widowed 2013 (after wife's long battle with MS).  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  Children live in [place], [place] and [place].   Married March 2016 [name])   1 dog.   Social Determinants of Health   Financial Resource Strain:   . Difficulty of Paying Living Expenses:   Food Insecurity:   . Worried About Charity fundraiser in the Last Year:   . Arboriculturist in the Last Year:   Transportation Needs:   . Film/video editor (Medical):   Marland Kitchen Lack of Transportation (Non-Medical):   Physical Activity:   . Days of Exercise per Week:   . Minutes  of Exercise per Session:   Stress:   . Feeling of Stress :   Social Connections:   . Frequency of Communication with Friends and Family:   . Frequency of Social Gatherings with Friends and Family:   . Attends Religious Services:   . Active Member of Clubs or Organizations:   . Attends Archivist Meetings:   Marland Kitchen Marital Status:      Family History:  The patient's ***family history includes Cancer (age of onset: 70) in his mother; Colon cancer in his mother; Colon polyps in his mother; Diabetes in his mother; Heart disease in his brother, father, and son; Hyperlipidemia in his brother, brother, brother, son, and son; Hypertension in his father; Sleep apnea in his son; Syncope episode in his son; Tremor in his brother and brother.   ROS:   Please see the history of present  illness.    ROS All other systems reviewed and are negative.   PHYSICAL EXAM:   VS:  There were no vitals taken for this visit.  Physical Exam  GEN: Well nourished, well developed, in no acute distress  Neck: no JVD, carotid bruits, or masses Cardiac:RRR; no murmurs, rubs, or gallops  Respiratory:  clear to auscultation bilaterally, normal work of breathing GI: soft, nontender, nondistended, + BS Ext: without cyanosis, clubbing, or edema, Good distal pulses bilaterally Neuro:  Alert and Oriented x 3 Psych: euthymic mood, full affect  Wt Readings from Last 3 Encounters:  03/17/20 174 lb 12.8 oz (79.3 kg)  03/09/20 176 lb 6.4 oz (80 kg)  01/20/20 177 lb 9.6 oz (80.6 kg)      Studies/Labs Reviewed:   EKG:  EKG is*** ordered today.  The ekg ordered today demonstrates ***  Recent Labs: 12/09/2019: ALT 21; BUN 14; Creatinine, Ser 1.24; Potassium 4.8; Sodium 141   Lipid Panel    Component Value Date/Time   CHOL 149 12/09/2019 0836   TRIG 108 12/09/2019 0836   HDL 47 12/09/2019 0836   CHOLHDL 3.2 12/09/2019 0836   CHOLHDL 2.9 10/30/2017 0944   VLDL 17 08/20/2016 1051   LDLCALC 82 12/09/2019 0836   LDLCALC 80 10/30/2017 0944    Additional studies/ records that were reviewed today include:  NST 04/11/2020   Study Highlights     Nuclear stress EF: 56%.  There was no ST segment deviation noted during stress.  No T wave inversion was noted during stress.  The study is normal.  This is a low risk study.  The left ventricular ejection fraction is normal (55-65%).   1. There are reduced counts in the mid inferior segment on rest and stress imaging consistent with diaphragm attenuation.  2. This is a normal study without evidence of ischemia or infarction.  3. Normal LVEF, 56%.  4. This is a low-risk study.      ASSESSMENT:    1. Dyspnea on exertion   2. Chest pain, unspecified type   3. Essential hypertension   4. Hyperlipidemia, unspecified hyperlipidemia  type      PLAN:  In order of problems listed above:  DOE/chest pain: lexiscan: There are stress test with normal LVEF and diaphragmatic attenuation.  2D echo pending  HTN on losartan and toprol  HLD on simvastatin followed by PCP    Medication Adjustments/Labs and Tests Ordered: Current medicines are reviewed at length with the patient today.  Concerns regarding medicines are outlined above.  Medication changes, Labs and Tests ordered today are listed in the Patient Instructions below.  There are no Patient Instructions on file for this visit.   Sumner Boast, PA-C  03/31/2020 9:12 AM    [facility] [address], [place], [place]  [address] Phone: [phone]; Fax: [phone]

## 2020-03-31 NOTE — Telephone Encounter (Signed)
Detailed instructions left with the patient, he stated that he understood and would be here for his test. Asked to call back with any questions. S.Raigen Jagielski EMTP

## 2020-04-05 ENCOUNTER — Encounter (HOSPITAL_COMMUNITY): Payer: PPO

## 2020-04-05 ENCOUNTER — Other Ambulatory Visit (HOSPITAL_COMMUNITY): Payer: PPO

## 2020-04-07 ENCOUNTER — Telehealth: Payer: Self-pay | Admitting: *Deleted

## 2020-04-07 NOTE — Telephone Encounter (Signed)
Patient advised.

## 2020-04-07 NOTE — Telephone Encounter (Signed)
Advise pt that she has sent me his office visit notes.  I trust her and concur.  If he is having dizziness, or pulse under 50-55, to let her be aware

## 2020-04-07 NOTE — Telephone Encounter (Signed)
Patient called and rescheduled his appt for next Monday to next Thursday-he is having stress test Monday. He was put on metoprolol in addition to his losartan by Dr Radford Pax and he is concerned. He wants to make sure you are aware and you think it is ok for him to take both of these medications.

## 2020-04-08 ENCOUNTER — Telehealth (HOSPITAL_COMMUNITY): Payer: Self-pay | Admitting: *Deleted

## 2020-04-08 NOTE — Telephone Encounter (Signed)
Patient given detailed instructions per Myocardial Perfusion Study Information Sheet for the test on 04/11/20 at 7:30. Patient notified to arrive 15 minutes early and that it is imperative to arrive on time for appointment to keep from having the test rescheduled.  If you need to cancel or reschedule your appointment, please call the office within 24 hours of your appointment. . Patient verbalized understanding.Eric Simmons

## 2020-04-11 ENCOUNTER — Ambulatory Visit (HOSPITAL_COMMUNITY): Payer: PPO | Attending: Cardiovascular Disease

## 2020-04-11 ENCOUNTER — Other Ambulatory Visit: Payer: Self-pay

## 2020-04-11 ENCOUNTER — Encounter: Payer: PPO | Admitting: Family Medicine

## 2020-04-11 DIAGNOSIS — R0602 Shortness of breath: Secondary | ICD-10-CM | POA: Diagnosis not present

## 2020-04-11 DIAGNOSIS — R072 Precordial pain: Secondary | ICD-10-CM | POA: Diagnosis not present

## 2020-04-11 LAB — MYOCARDIAL PERFUSION IMAGING
LV dias vol: 127 mL (ref 62–150)
LV sys vol: 56 mL
Peak HR: 75 {beats}/min
Rest HR: 60 {beats}/min
SDS: 4
SRS: 5
SSS: 9
TID: 1.03

## 2020-04-11 MED ORDER — REGADENOSON 0.4 MG/5ML IV SOLN
0.4000 mg | Freq: Once | INTRAVENOUS | Status: AC
Start: 2020-04-11 — End: 2020-04-11
  Administered 2020-04-11: 0.4 mg via INTRAVENOUS

## 2020-04-11 MED ORDER — TECHNETIUM TC 99M TETROFOSMIN IV KIT
10.2000 | PACK | Freq: Once | INTRAVENOUS | Status: AC | PRN
  Administered 2020-04-11: 10.2 via INTRAVENOUS
  Filled 2020-04-11: qty 11

## 2020-04-11 MED ORDER — TECHNETIUM TC 99M TETROFOSMIN IV KIT
31.8000 | PACK | Freq: Once | INTRAVENOUS | Status: AC | PRN
  Administered 2020-04-11: 31.8 via INTRAVENOUS
  Filled 2020-04-11: qty 32

## 2020-04-12 ENCOUNTER — Ambulatory Visit: Payer: PPO | Admitting: Physician Assistant

## 2020-04-13 ENCOUNTER — Telehealth: Payer: Self-pay | Admitting: Family Medicine

## 2020-04-13 DIAGNOSIS — I10 Essential (primary) hypertension: Secondary | ICD-10-CM

## 2020-04-13 MED ORDER — LOSARTAN POTASSIUM 50 MG PO TABS: ORAL_TABLET | ORAL | 0 refills | Status: DC

## 2020-04-13 NOTE — Telephone Encounter (Signed)
Pt requesting losartan potassium 50 mg tab last seen 03/09/20    [facility] - [place], [place] - [address]  [address], [place] [address]  Phone:  [phone] Fax:  [phone]

## 2020-04-13 NOTE — Progress Notes (Signed)
Chief Complaint  Patient presents with  . Hypertension    follow up. Some readings are still high-brought in list.     Patient presents for 4 week f/u on his hypertension and DOE.  He was started on losartan, and referred to cardiology for further evaluation. Dr. Radford Pax ordered a stress test and echo. She had him continue 25mg  losartan, and added Toprol XL 25mg  daily. He has since increased to the full 50mg  tablet, as his BP's remained high.  Stress test was normal.  Echo is scheduled for 6/28.  He is feeling better.  "Not perfect, but better". He still has DOE if walking in the heat, along with some pressure on the chest.  Seems better if he breathes in through his nose, out through his mouth. He is still walking less than he used to, but more than last month.  [facility].  BP's this week have been running 132-168/76-89.  Pulse 59-71.  It was 156/87, pulse 64 at home this morning.  BP's can be much higher when he notes chest pressure (up to 756 systolic)  Urologist put him on Flomax, which has been very helpful.  Up only 2x/night to void now. Down from 5x/night.  He is very happy with this improvement.  PMH, PSH, SH reviewed  Outpatient Encounter Medications as of 04/14/2020  Medication Sig Note  . cholecalciferol (VITAMIN D) 1000 units tablet Take 1,000 Units by mouth daily.   Marland Kitchen Co-Enzyme Q-10 100 MG CAPS Take 300 mg by mouth daily.   . Cyanocobalamin (VITAMIN B 12 PO) Take 1 capsule by mouth daily.   . Glucosamine-Chondroit-Vit C-Mn (GLUCOSAMINE 1500 COMPLEX) CAPS Take 1 capsule by mouth daily.   Marland Kitchen losartan (COZAAR) 50 MG tablet Take 1/2 tablet by mouth once daily. Increase to full tablet after 1-2 weeks if blood pressure remains over 433 systolic 2/95/1884: Taking a full tablet  . metoprolol succinate (TOPROL XL) 25 MG 24 hr tablet Take 1 tablet (25 mg total) by mouth daily.   . primidone (MYSOLINE) 50 MG tablet TAKE ONE TABLET AT BEDTIME   .  simvastatin (ZOCOR) 40 MG tablet TAKE ONE TABLET AT BEDTIME   . tamsulosin (FLOMAX) 0.4 MG CAPS capsule Take 1 capsule (0.4 mg total) by mouth daily.   Marland Kitchen acetaminophen (TYLENOL) 500 MG tablet Take 500 mg by mouth as needed. Reported on 12/28/2015 (Patient not taking: Reported on 04/14/2020)   . sildenafil (VIAGRA) 100 MG tablet Take 100 mg by mouth as needed. (Patient not taking: Reported on 04/14/2020)    Facility-Administered Encounter Medications as of 04/14/2020  Medication  . 0.9 %  sodium chloride infusion   Allergies  Allergen Reactions  . Codeine     REACTION: violent vomiting   ROS: no fever, chills, URI symptoms.  DOE with some chest pressure intermittently, per HPI. No headaches, dizziness, cough, edema.  Nocturia improved on flomax.  No GI complaints or other concerns. Tremor is still an issue.   PHYSICAL EXAM:  BP 120/60   Pulse (!) 56   Temp 98 F (36.7 C) (Temporal)   Ht 6' (1.829 m)   Wt 172 lb 12.8 oz (78.4 kg)   BMI 23.44 kg/m   124/70 on repeat by MD  Pleasant, elderly male, in good spirits, in no distress HEENT: conjunctiva and sclera are clear, EOMI Neck: no lymphadenopathy Heart: regular rate and rhythm Lungs: clear bilaterally Back: no spinal or CVA tenderness Abdomen soft, nontender Extremities:  no edema Psych: normal mood, affect, hygiene and grooming Neuro: alert, oriented. Slight intention tremor. Normal gait.  ASSESSMENT/PLAN:  Essential hypertension - improved control. BP's higher at home. Cont current regimen. Echo as planned next week - Plan: Basic metabolic panel  Dyspnea on exertion - improving.  Echo scheduled for next week through cardiology  Medication monitoring encounter - Plan: Basic metabolic panel

## 2020-04-13 NOTE — Telephone Encounter (Signed)
Done

## 2020-04-14 ENCOUNTER — Other Ambulatory Visit: Payer: Self-pay

## 2020-04-14 ENCOUNTER — Encounter: Payer: Self-pay | Admitting: Family Medicine

## 2020-04-14 ENCOUNTER — Ambulatory Visit (INDEPENDENT_AMBULATORY_CARE_PROVIDER_SITE_OTHER): Payer: PPO | Admitting: Family Medicine

## 2020-04-14 VITALS — BP 120/60 | HR 56 | Temp 98.0°F | Ht 72.0 in | Wt 172.8 lb

## 2020-04-14 DIAGNOSIS — Z5181 Encounter for therapeutic drug level monitoring: Secondary | ICD-10-CM

## 2020-04-14 DIAGNOSIS — R0609 Other forms of dyspnea: Secondary | ICD-10-CM

## 2020-04-14 DIAGNOSIS — I10 Essential (primary) hypertension: Secondary | ICD-10-CM | POA: Diagnosis not present

## 2020-04-14 DIAGNOSIS — R0600 Dyspnea, unspecified: Secondary | ICD-10-CM | POA: Diagnosis not present

## 2020-04-14 NOTE — Patient Instructions (Addendum)
Your blood pressure is excellent today. Continue on your current regimen--50mg  of losartan and 25mg  of metoprolol.  Looking forward to seeing the results of the echocardiogram and report from Dr. Radford Pax when I get back from vacation.

## 2020-04-15 LAB — BASIC METABOLIC PANEL
BUN/Creatinine Ratio: 19 (ref 10–24)
BUN: 19 mg/dL (ref 8–27)
CO2: 19 mmol/L — ABNORMAL LOW (ref 20–29)
Calcium: 9.3 mg/dL (ref 8.6–10.2)
Chloride: 104 mmol/L (ref 96–106)
Creatinine, Ser: 1.01 mg/dL (ref 0.76–1.27)
GFR calc Af Amer: 81 mL/min/{1.73_m2} (ref >59)
GFR calc non Af Amer: 70 mL/min/{1.73_m2} (ref >59)
Glucose: 97 mg/dL (ref 65–99)
Potassium: 4.3 mmol/L (ref 3.5–5.2)
Sodium: 138 mmol/L (ref 134–144)

## 2020-04-25 ENCOUNTER — Other Ambulatory Visit: Payer: Self-pay

## 2020-04-25 ENCOUNTER — Ambulatory Visit (HOSPITAL_COMMUNITY): Payer: PPO | Attending: Cardiovascular Disease

## 2020-04-25 DIAGNOSIS — R072 Precordial pain: Secondary | ICD-10-CM | POA: Diagnosis not present

## 2020-04-25 DIAGNOSIS — R0602 Shortness of breath: Secondary | ICD-10-CM | POA: Insufficient documentation

## 2020-06-14 ENCOUNTER — Other Ambulatory Visit: Payer: Self-pay | Admitting: Family Medicine

## 2020-06-14 DIAGNOSIS — E7800 Pure hypercholesterolemia, unspecified: Secondary | ICD-10-CM

## 2020-06-20 NOTE — Progress Notes (Signed)
Cardiology Office Note    Date:  06/21/2020   ID:  Eric Lenderman., DOB 1941-08-20, MRN [id number]  PCP:  [name], MD  Cardiologist:  [name], MD   Chief Complaint  Patient presents with  . Chest Pain  . Shortness of Breath    History of Present Illness:  Eric Banh. is a 79 y.o. male with a hx of HLD who recently saw his PCP and complained of DOE and was referred to me for further evaulation. At his initial OV he told me that he had noticed that his BP was elevated when he exerted himself.  Around the same time he started noticing DOE and chest discomfort with exertion as well.  He used to be an avid walking and walked 2 miles 4 times weekly but had recently  noticed DOE and chest tightness working in the yard and walking. His father died at 63 of a massive MI.   He underwent nuclear stress test showing no ischemia and normal LVF.  2D echo showed normal LVF and mildly leaky MV.  He is now back for followup.  He says that starting on Toprol his symptoms of CP and SOB have resolved. Since going on Toprol, his SOB and CP have significantly improved.   He denies any , PND, orthopnea, LE edema, dizziness, palpitations or syncope. He is compliant with his meds and is tolerating meds with no SE.     Past Medical History:  Diagnosis Date  . Adenomatous colon polyp 6/06   tubular adenoma, Dr. [name]  . BCC (basal cell carcinoma), face 03/2006   left face, R ear (04/2014), nose 05/2016  . Benign familial tremor 08/29/2012  . Erectile dysfunction   . Hemorrhoids    internal and external  . Hip arthritis    left  . IFG (impaired fasting glucose) 12/10/2012   Glucose 111 11/2012   . Impaired fasting glucose    (since his 20's; had yearly GTT's for 5 yrs, never progressed)  . Insomnia   . Mild neurocognitive disorder 09/11/2019  . Pseudogout of knee 10/04   left  . Pure hypercholesterolemia     Past Surgical History:  Procedure Laterality Date  . COLONOSCOPY  6/06,  05/2010   Dr. [name]  . MOHS SURGERY  6/07   L face, Dr. Link Snuffer  . POLYPECTOMY    . TONSILLECTOMY    . VASECTOMY      Current Medications: No outpatient medications have been marked as taking for the 06/21/20 encounter (Office Visit) with [name], MD.   Current Facility-Administered Medications for the 06/21/20 encounter (Office Visit) with [name], MD  Medication  . 0.9 %  sodium chloride infusion    Allergies:   Codeine   Social History   Socioeconomic History  . Marital status: Widowed    Spouse name: Not on file  . Number of children: 2  . Years of education: 16  . Highest education level: Bachelor's degree (e.g., BA, AB, BS)  Occupational History  . Occupation: retired  Tobacco Use  . Smoking status: Former Smoker    Types: Cigars  . Smokeless tobacco: Never Used  . Tobacco comment: as a teenager  Vaping Use  . Vaping Use: Never used  Substance and Sexual Activity  . Alcohol use: Yes    Alcohol/week: 0.0 standard drinks    Comment: 5-6 ounces of Richter wine or beer 5-6 x/week   . Drug use: No  .  Sexual activity: Yes  Other Topics Concern  . Not on file  Social History Narrative   Widowed 2013 (after wife's long battle with MS).  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  Children live in [place], [place] and [place].   Married March 2016 [name])   1 dog.   Social Determinants of Health   Financial Resource Strain:   . Difficulty of Paying Living Expenses: Not on file  Food Insecurity:   . Worried About Charity fundraiser in the Last Year: Not on file  . Ran Out of Food in the Last Year: Not on file  Transportation Needs:   . Lack of Transportation (Medical): Not on file  . Lack of Transportation (Non-Medical): Not on file  Physical Activity:   . Days of Exercise per Week: Not on file  . Minutes of Exercise per Session: Not on file   Stress:   . Feeling of Stress : Not on file  Social Connections:   . Frequency of Communication with Friends and Family: Not on file  . Frequency of Social Gatherings with Friends and Family: Not on file  . Attends Religious Services: Not on file  . Active Member of Clubs or Organizations: Not on file  . Attends Archivist Meetings: Not on file  . Marital Status: Not on file     Family History:  The patient's family history includes Cancer (age of onset: 75) in his mother; Colon cancer in his mother; Colon polyps in his mother; Diabetes in his mother; Heart disease in his brother, father, and son; Hyperlipidemia in his brother, brother, brother, son, and son; Hypertension in his father; Sleep apnea in his son; Syncope episode in his son; Tremor in his brother and brother.   ROS:   Please see the history of present illness.    ROS All other systems reviewed and are negative.  No flowsheet data found.     PHYSICAL EXAM:   VS:  BP 124/82   Pulse 62   Ht 6' (1.829 m)   Wt 171 lb (77.6 kg)   SpO2 95%   BMI 23.19 kg/m    GEN: Well nourished, well developed, in no acute distress  HEENT: normal  Neck: no JVD, carotid bruits, or masses Cardiac: RRR; no murmurs, rubs, or gallops,no edema.  Intact distal pulses bilaterally.  Respiratory:  clear to auscultation bilaterally, normal work of breathing GI: soft, nontender, nondistended, + BS MS: no deformity or atrophy  Skin: warm and dry, no rash Neuro:  Alert and Oriented x 3, Strength and sensation are intact Psych: euthymic mood, full affect  Wt Readings from Last 3 Encounters:  06/21/20 171 lb (77.6 kg)  04/14/20 172 lb 12.8 oz (78.4 kg)  04/11/20 174 lb (78.9 kg)      Studies/Labs Reviewed:   EKG:  EKG is not ordered today.    Recent Labs: 12/09/2019: ALT 21 04/14/2020: BUN 19; Creatinine, Ser 1.01; Potassium 4.3; Sodium 138   Lipid Panel    Component Value Date/Time   CHOL 149 12/09/2019 0836   TRIG 108  12/09/2019 0836   HDL 47 12/09/2019 0836   CHOLHDL 3.2 12/09/2019 0836   CHOLHDL 2.9 10/30/2017 0944   VLDL 17 08/20/2016 1051   LDLCALC 82 12/09/2019 0836   LDLCALC 80 10/30/2017 0944    Additional studies/ records that were reviewed today include:  2D echo 03/2020  IMPRESSIONS    1. Left ventricular ejection fraction, by estimation, is 60 to 65%. The  left ventricle has normal function. The left ventricle has no regional  wall motion abnormalities. Left ventricular diastolic parameters are  indeterminate.  2. Right ventricular systolic function is normal. The right ventricular  size is normal. There is normal pulmonary artery systolic pressure. The  estimated right ventricular systolic pressure is 57.3 mmHg.  3. The mitral valve is grossly normal. Mild mitral valve regurgitation.  No evidence of mitral stenosis.  4. The aortic valve is tricuspid. Aortic valve regurgitation is not  visualized. Mild aortic valve sclerosis is present, with no evidence of  aortic valve stenosis.  5. The inferior vena cava is normal in size with greater than 50%  respiratory variability, suggesting right atrial pressure of 3 mmHg.   Cardiac Cath 03/2020 Study Highlights    Nuclear stress EF: 56%.  There was no ST segment deviation noted during stress.  No T wave inversion was noted during stress.  The study is normal.  This is a low risk study.  The left ventricular ejection fraction is normal (55-65%).   1. There are reduced counts in the mid inferior segment on rest and stress imaging consistent with diaphragm attenuation.  2. This is a normal study without evidence of ischemia or infarction.  3. Normal LVEF, 56%.  4. This is a low-risk study.    ASSESSMENT:    1. SOB (shortness of breath)   2. Essential hypertension   3. Pure hypercholesterolemia      PLAN:  In order of problems listed above:  7. SOB/Chest pain -possibly related to poorly controlled HTN and  diastolic dysfunction  -CP and SOB have resolved on Toprol and getting BP controlled -nuclear stress test showed no ischemia -2D echo showed normal LVF  2.  HTN -BP controlled on exam -continue Losartan 25mg  daily and Toprol XL 25mg  daily  3.  HLD -followed by PCP -continue Simvastatin  Medication Adjustments/Labs and Tests Ordered: Current medicines are reviewed at length with the patient today.  Concerns regarding medicines are outlined above.  Medication changes, Labs and Tests ordered today are listed in the Patient Instructions below.  There are no Patient Instructions on file for this visit.   Signed, [name], MD  06/21/2020 10:08 AM    [facility] [place], [place],   [address] Phone: [phone]; Fax: [phone]

## 2020-06-21 ENCOUNTER — Other Ambulatory Visit: Payer: Self-pay

## 2020-06-21 ENCOUNTER — Ambulatory Visit: Payer: PPO | Admitting: Cardiology

## 2020-06-21 ENCOUNTER — Encounter: Payer: Self-pay | Admitting: Cardiology

## 2020-06-21 VITALS — BP 124/82 | HR 62 | Ht 72.0 in | Wt 171.0 lb

## 2020-06-21 DIAGNOSIS — R0602 Shortness of breath: Secondary | ICD-10-CM | POA: Diagnosis not present

## 2020-06-21 DIAGNOSIS — I10 Essential (primary) hypertension: Secondary | ICD-10-CM

## 2020-06-21 DIAGNOSIS — E7800 Pure hypercholesterolemia, unspecified: Secondary | ICD-10-CM | POA: Diagnosis not present

## 2020-06-21 NOTE — Patient Instructions (Signed)
Medication Instructions:  Your physician recommends that you continue on your current medications as directed. Please refer to the Current Medication list given to you today.  *If you need a refill on your cardiac medications before your next appointment, please call your pharmacy*  Follow-Up: At [facility], you and your health needs are our priority.  As part of our continuing mission to provide you with exceptional heart care, we have created designated Provider Care Teams.  These Care Teams include your primary Cardiologist (physician) and Advanced Practice Providers (APPs -  Physician Assistants and Nurse Practitioners) who all work together to provide you with the care you need, when you need it.  Follow up with Dr. Turner as needed 

## 2020-07-02 ENCOUNTER — Other Ambulatory Visit: Payer: Self-pay | Admitting: Family Medicine

## 2020-07-02 DIAGNOSIS — I10 Essential (primary) hypertension: Secondary | ICD-10-CM

## 2020-07-05 NOTE — Progress Notes (Signed)
Chief Complaint  Patient presents with  . Hypertension    nonfasting med check. First granddaughter is getting married in Nov and he would like to make sure he has his COVID booster before flying.    Patient presents for med check. Hypertension: He is compliant with losartan (25mg  in the evening) and metoprolol 25mg  in the morning.  BP's are running 120's in the morning, up to 150-160 later in the day if active, sometimes it stays normal.  He has seen Dr. Radford Pax and had eval for DOE with echo (normal LVF) and stress test (low risk study).  DOE improved with improved BP control. He no longer notices DOE (limitation in walking is now from pain in the left hip).  Hyperlipidemia follow-up: Patient is reportedly following a low-fat, low cholesterol diet. Compliant with medications (simvastatin 40mg ) and denies medication side effects. Last lipids were at goal. Lab Results  Component Value Date   CHOL 149 12/09/2019   HDL 47 12/09/2019   LDLCALC 82 12/09/2019   TRIG 108 12/09/2019   CHOLHDL 3.2 12/09/2019   BPH:  Under the care of Dr. Eulogio Ditch.  He remains on flomax and is doing well (overall improved urinary frequency, up 1-2x/night to void). He reports he cut it back to every other day, didn't think he needed it daily (he thinks less medication is better, notices a little difference on the days he doesn't take it, but never gets up more than 2-3x/night). Denies dysuria, hematuria. Feels like he empties his bladder well. Occasional dribble at night on the days he doesn't take the medication.  He is under the care of Dr. Carles Collet for essential tremor and memory concerns. He is on primidone for tremor. He had neuropsych evaluation with Dr. Melvyn Novas, consistent with mild cognitive impairment.  No medications were recommended.  Physical and mental exercises were recommended. Tremor isn't much better with the primidone, mainly affects the right hand, and his handwriting. Hands and feet can get stiff and "seize  up" sometimes--he really thinks the primidone helps prevent this. He has f/u with Dr. Melvyn Novas and Dr. Carles Collet scheduled for November and December.  Impaired fasting glucose. This has been very mild, ongoing/nonprogressive x years. He continues to exercise regularly. No polydipsia, polyuria.  He tries to control his sugar/carbs in diet. Lab Results  Component Value Date   HGBA1C 6.0 (H) 12/09/2019   H/o Left hip pain: Advanced OA noted on x-ray.Cortisone injections have been helpful.Getship injections periodically, pain flares in the summer (yardwork, lots of squatting). He used to need injections once a year, but needing twice/year in the last couple of years. He uses tylenol prn for pain. He continues to takeglucosamine/chondroitin and coenzyme Q10 which helps a lot.   PMH, PSH, SH reviewed  Immunization History  Administered Date(s) Administered  . Fluad Quad(high Dose 65+) 07/16/2019, 07/06/2020  . Influenza Split 07/18/2011, 08/28/2012  . Influenza, High Dose Seasonal PF 08/19/2014, 06/22/2015, 08/20/2016, 08/20/2017  . Influenza,inj,Quad PF,6+ Mos 07/06/2013  . Influenza-Unspecified 07/25/2018  . PFIZER SARS-COV-2 Vaccination 11/18/2019, 12/07/2019  . Pneumococcal Conjugate-13 06/14/2014  . Pneumococcal Polysaccharide-23 12/13/2011  . Tdap 03/02/2011  . Zoster 03/25/2014   Outpatient Encounter Medications as of 07/06/2020  Medication Sig Note  . cholecalciferol (VITAMIN D) 1000 units tablet Take 1,000 Units by mouth daily.   Marland Kitchen Co-Enzyme Q-10 100 MG CAPS Take 300 mg by mouth daily.   . Cyanocobalamin (VITAMIN B 12 PO) Take 1 capsule by mouth daily.   . Glucosamine-Chondroit-Vit C-Mn (GLUCOSAMINE 1500 COMPLEX)  CAPS Take 1 capsule by mouth daily.   Marland Kitchen losartan (COZAAR) 50 MG tablet Take 1/2 tablet by mouth once daily. Increase to full tablet after 1-2 weeks if blood pressure remains over 841 systolic   . metoprolol succinate (TOPROL XL) 25 MG 24 hr tablet Take 1 tablet (25 mg  total) by mouth daily.   . primidone (MYSOLINE) 50 MG tablet TAKE ONE TABLET AT BEDTIME   . simvastatin (ZOCOR) 40 MG tablet TAKE ONE TABLET AT BEDTIME   . tamsulosin (FLOMAX) 0.4 MG CAPS capsule Take 1 capsule (0.4 mg total) by mouth daily. 07/06/2020: Every other day  . acetaminophen (TYLENOL) 500 MG tablet Take 500 mg by mouth as needed. Reported on 12/28/2015 (Patient not taking: Reported on 04/14/2020)   . sildenafil (VIAGRA) 100 MG tablet Take 100 mg by mouth as needed. (Patient not taking: Reported on 04/14/2020)    Facility-Administered Encounter Medications as of 07/06/2020  Medication  . 0.9 %  sodium chloride infusion   Allergies  Allergen Reactions  . Codeine     REACTION: violent vomiting    ROS:  No fever, chills, URI symptoms, headaches, dizziness, chest pain, nausea, vomiting, diarrhea. DOE improved, see HPI. Urinary symptoms/BPH per HPI, improved Moods are good. L hip pain per HPI. No bleeding, bruising, rash.   PHYSICAL EXAM:  BP 122/60   Pulse 60   Ht 6' (1.829 m)   Wt 174 lb 3.2 oz (79 kg)   BMI 23.63 kg/m   Wt Readings from Last 3 Encounters:  07/06/20 174 lb 3.2 oz (79 kg)  06/21/20 171 lb (77.6 kg)  04/14/20 172 lb 12.8 oz (78.4 kg)   Pleasant, elderly male, in good spirits, in no distress HEENT: conjunctiva and sclera are clear, EOMI. Wearing mask. Neck: no lymphadenopathy, thyromegaly or bruit Heart: regular rate and rhythm Lungs: clear bilaterally Back: no spinal or CVA tenderness Abdomen soft, nontender Extremities: no edema Psych: normal mood, affect, hygiene and grooming Neuro: alert, oriented. Slight intention tremor on the right. Normal gait.  Lab Results  Component Value Date   HGBA1C 6.0 (A) 07/06/2020    ASSESSMENT/PLAN:  Essential hypertension - Well controlled on current regimen of losartan and metoprolol, continue - Plan: losartan (COZAAR) 25 MG tablet  Pure hypercholesterolemia - at goal per last check. Cont statin  IFG  (impaired fasting glucose) - stable. Continue low carb diet, regular exercise - Plan: HgB A1c  Essential tremor - mild in R hand, on primidone. has f/u with neuro  Need for influenza vaccination - Plan: Flu Vaccine QUAD High Dose(Fluad)   Discussed recommendation for COVID booster after 10/8 (follow news regarding recommendations).  F/u as scheduled for CPE

## 2020-07-06 ENCOUNTER — Ambulatory Visit (INDEPENDENT_AMBULATORY_CARE_PROVIDER_SITE_OTHER): Payer: PPO | Admitting: Family Medicine

## 2020-07-06 ENCOUNTER — Encounter: Payer: Self-pay | Admitting: Family Medicine

## 2020-07-06 ENCOUNTER — Other Ambulatory Visit: Payer: Self-pay

## 2020-07-06 VITALS — BP 122/60 | HR 60 | Ht 72.0 in | Wt 174.2 lb

## 2020-07-06 DIAGNOSIS — R7301 Impaired fasting glucose: Secondary | ICD-10-CM | POA: Diagnosis not present

## 2020-07-06 DIAGNOSIS — E7800 Pure hypercholesterolemia, unspecified: Secondary | ICD-10-CM

## 2020-07-06 DIAGNOSIS — I10 Essential (primary) hypertension: Secondary | ICD-10-CM | POA: Diagnosis not present

## 2020-07-06 DIAGNOSIS — G250 Essential tremor: Secondary | ICD-10-CM | POA: Diagnosis not present

## 2020-07-06 DIAGNOSIS — Z23 Encounter for immunization: Secondary | ICD-10-CM | POA: Diagnosis not present

## 2020-07-06 LAB — POCT GLYCOSYLATED HEMOGLOBIN (HGB A1C): Hemoglobin A1C: 6 % — AB (ref 4.0–5.6)

## 2020-07-06 MED ORDER — LOSARTAN POTASSIUM 25 MG PO TABS: 25.0000 mg | ORAL_TABLET | Freq: Every day | ORAL | 1 refills | Status: DC

## 2020-07-12 DIAGNOSIS — L570 Actinic keratosis: Secondary | ICD-10-CM | POA: Diagnosis not present

## 2020-07-12 DIAGNOSIS — L812 Freckles: Secondary | ICD-10-CM | POA: Diagnosis not present

## 2020-07-12 DIAGNOSIS — C44212 Basal cell carcinoma of skin of right ear and external auricular canal: Secondary | ICD-10-CM | POA: Diagnosis not present

## 2020-07-12 DIAGNOSIS — L821 Other seborrheic keratosis: Secondary | ICD-10-CM | POA: Diagnosis not present

## 2020-07-12 DIAGNOSIS — D1801 Hemangioma of skin and subcutaneous tissue: Secondary | ICD-10-CM | POA: Diagnosis not present

## 2020-07-12 DIAGNOSIS — L910 Hypertrophic scar: Secondary | ICD-10-CM | POA: Diagnosis not present

## 2020-07-12 DIAGNOSIS — Z85828 Personal history of other malignant neoplasm of skin: Secondary | ICD-10-CM | POA: Diagnosis not present

## 2020-07-12 DIAGNOSIS — D485 Neoplasm of uncertain behavior of skin: Secondary | ICD-10-CM | POA: Diagnosis not present

## 2020-07-12 DIAGNOSIS — M7130 Other bursal cyst, unspecified site: Secondary | ICD-10-CM | POA: Diagnosis not present

## 2020-07-19 ENCOUNTER — Other Ambulatory Visit: Payer: Self-pay | Admitting: Neurology

## 2020-07-20 ENCOUNTER — Encounter: Payer: Self-pay | Admitting: Family Medicine

## 2020-08-02 ENCOUNTER — Ambulatory Visit (INDEPENDENT_AMBULATORY_CARE_PROVIDER_SITE_OTHER): Payer: PPO

## 2020-08-02 ENCOUNTER — Other Ambulatory Visit: Payer: Self-pay

## 2020-08-02 DIAGNOSIS — Z23 Encounter for immunization: Secondary | ICD-10-CM

## 2020-08-04 DIAGNOSIS — C44212 Basal cell carcinoma of skin of right ear and external auricular canal: Secondary | ICD-10-CM | POA: Diagnosis not present

## 2020-08-04 DIAGNOSIS — Z85828 Personal history of other malignant neoplasm of skin: Secondary | ICD-10-CM | POA: Diagnosis not present

## 2020-09-12 ENCOUNTER — Encounter: Payer: PPO | Admitting: Psychology

## 2020-09-13 ENCOUNTER — Ambulatory Visit (INDEPENDENT_AMBULATORY_CARE_PROVIDER_SITE_OTHER): Payer: PPO | Admitting: Psychology

## 2020-09-13 ENCOUNTER — Encounter: Payer: Self-pay | Admitting: Psychology

## 2020-09-13 ENCOUNTER — Ambulatory Visit: Payer: PPO | Admitting: Psychology

## 2020-09-13 ENCOUNTER — Other Ambulatory Visit: Payer: Self-pay

## 2020-09-13 DIAGNOSIS — R4189 Other symptoms and signs involving cognitive functions and awareness: Secondary | ICD-10-CM

## 2020-09-13 DIAGNOSIS — G3184 Mild cognitive impairment, so stated: Secondary | ICD-10-CM

## 2020-09-13 NOTE — Progress Notes (Signed)
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 Spatial Module, Form 1: T Score T Score Percentile     Visual Discrimination 38 43 25 Average    Design Construction 48 48 42 Average         Scaled Score Scaled Score Percentile   WAIS-IV Matrix Reasoning: 11 12 75 Above Average        Mood and Personality:       Raw Score Raw Score Percentile   Geriatric Depression Scale: 9 5 --- Within Normal Limits  Geriatric Anxiety Scale: 5 4 --- Minimal    Somatic 3 3 --- Minimal    Cognitive 2 1 --- Minimal    Affective 0 0 --- Minimal        Additional Questionnaires:       Raw Score Raw Score Percentile   PROMIS Sleep Disturbance Questionnaire: 17 14 --- None to Slight   Informed Consent and Coding/Compliance:   Mr. Casillas was provided with a verbal description of the nature and purpose of the present neuropsychological  evaluation. Also reviewed were the foreseeable risks and/or discomforts and benefits of the procedure, limits of confidentiality, and mandatory reporting requirements of this provider. The patient was given the opportunity to ask questions and receive  answers about the evaluation. Oral consent to participate was provided by the patient.   This evaluation was conducted by Christia Reading, Ph.D., licensed clinical neuropsychologist. Mr. Dalto completed a comprehensive clinical interview with Dr. Melvyn Novas, billed as one unit [phone], and 140 minutes of cognitive testing and scoring, billed as one unit [phone] and four additional units [id number]. Psychometrist Milana Kidney, B.S., assisted Dr. Melvyn Novas with test administration and scoring procedures. As a separate and discrete service, Dr. Melvyn Novas spent a total of 160 minutes in interpretation and report writing billed as one unit [phone] and two units [id number].

## 2020-09-13 NOTE — Progress Notes (Signed)
   Psychometrician Note   Cognitive testing was administered to Eric Simmons. by Milana Kidney, B.S. (psychometrist) under the supervision of Dr. Christia Reading, Ph.D., licensed psychologist on 09/13/20. Eric Simmons did not appear overtly distressed by the testing session per behavioral observation or responses across self-report questionnaires. Dr. Christia Reading, Ph.D. checked in with Eric Simmons as needed to manage any distress related to testing procedures (if applicable). Rest breaks were offered.    The battery of tests administered was selected by Dr. Christia Reading, Ph.D. with consideration to Eric Simmons's current level of functioning, the nature of his symptoms, emotional and behavioral responses during interview, level of literacy, observed level of motivation/effort, and the nature of the referral question. This battery was communicated to the psychometrist. Communication between Dr. Christia Reading, Ph.D. and the psychometrist was ongoing throughout the evaluation and Dr. Christia Reading, Ph.D. was immediately accessible at all times. Dr. Christia Reading, Ph.D. provided supervision to the psychometrist on the date of this service to the extent necessary to assure the quality of all services provided.    Eric Simmons. will return within approximately 1-2 weeks for an interactive feedback session with Dr. Melvyn Novas at which time his test performances, clinical impressions, and treatment recommendations will be reviewed in detail. Eric Simmons understands he can contact our office should he require our assistance before this time.  A total of 140 minutes of billable time were spent face-to-face with Eric Simmons by the psychometrist. This includes both test administration and scoring time. Billing for these services is reflected in the clinical report generated by Dr. Christia Reading, Ph.D..  This note reflects time spent with the psychometrician and does not include test scores or any clinical  interpretations made by Dr. Melvyn Novas. The full report will follow in a separate note.

## 2020-09-14 ENCOUNTER — Encounter: Payer: Self-pay | Admitting: Psychology

## 2020-09-16 DIAGNOSIS — N401 Enlarged prostate with lower urinary tract symptoms: Secondary | ICD-10-CM | POA: Diagnosis not present

## 2020-09-16 DIAGNOSIS — N3000 Acute cystitis without hematuria: Secondary | ICD-10-CM | POA: Diagnosis not present

## 2020-09-16 DIAGNOSIS — N5201 Erectile dysfunction due to arterial insufficiency: Secondary | ICD-10-CM | POA: Diagnosis not present

## 2020-09-16 DIAGNOSIS — R3914 Feeling of incomplete bladder emptying: Secondary | ICD-10-CM | POA: Diagnosis not present

## 2020-09-17 ENCOUNTER — Other Ambulatory Visit: Payer: Self-pay | Admitting: Family Medicine

## 2020-09-17 DIAGNOSIS — E7800 Pure hypercholesterolemia, unspecified: Secondary | ICD-10-CM

## 2020-09-19 ENCOUNTER — Encounter: Payer: PPO | Admitting: Psychology

## 2020-09-29 ENCOUNTER — Other Ambulatory Visit: Payer: Self-pay

## 2020-09-29 ENCOUNTER — Ambulatory Visit (INDEPENDENT_AMBULATORY_CARE_PROVIDER_SITE_OTHER): Payer: PPO | Admitting: Psychology

## 2020-09-29 DIAGNOSIS — G3184 Mild cognitive impairment, so stated: Secondary | ICD-10-CM

## 2020-09-29 NOTE — Progress Notes (Signed)
   Neuropsychology Feedback Session Eric Simmons. [facility]  Reason for Referral:   GRAE CANNATA Jr.is a 79 y.o. right-handed Caucasian male referred by Alonza Bogus, D.O.,to characterize hiscurrent cognitive functioning and assist with diagnostic clarity and treatment planning in the context of subjective cognitive dysfunction, neuroimaging concerning for degenerative illness, and prior diagnosis of a mild neurocognitive disorder.   Feedback:   Mr. Fuhs completed a comprehensive neuropsychological evaluation on 09/13/2020. Please refer to that encounter for the full report and recommendations. Briefly, results suggested normative weaknesses across working memory, semantic fluency, and encoding (i.e., learning) and retrieval aspects across shape and list learning tasks. Performance variability was further noted across processing speed and executive functioning. Relative to his previous evaluation, Mr. Hazen exhibited a good amount of stability. Performance declines were seen across processing speed, certain aspects of executive functioning (i.e., working memory, pattern recognition/adaptability), and the learning of novel visual information. Slight improvements were seen across expressive language, clock drawing, retention of previously learned story-based information, and across a list learning yes/no recognition trial. Overall, the etiology of cognitive weaknesses continues to be unclear. As stated previously, deficits in verbal memory and semantic fluency can be concerning for the development of Alzheimer's disease. Previous neuroimaging findings, namely the presence of superficial siderosis and bilateral mesial temporal lobe volume loss, are also known risk factors for the development of this condition. However, Mr. Panuco demonstrated a good degree of stability across the two evaluations and areas where he did exhibit declines are not necessarily hallmark  areas affected by Alzheimer's disease early on in this disease process. In fact, he exhibited slight improvements across certain aspects of verbal memory measures, inconsistent with the expected trajectory of this condition. While I do continue to have concerns surrounding a neurodegenerative illness, the current results are encouraging that if indeed present, it may be progressing slowly.  Mr. Mustin was accompanied by his wife during the current feedback appointment. Content of the current session focused on the results of his neuropsychological evaluation. Mr. Feggins and his wife were given the opportunity to ask questions and their questions were answered. They were encouraged to reach out should additional questions arise. A copy of his report was provided at the conclusion of the visit.      30 minutes were spent conducting the current feedback session with Mr. Retter, billed as one unit [phone].

## 2020-09-29 NOTE — Patient Instructions (Signed)
A repeat neuropsychological evaluation in 18 months (or sooner if functional decline is noted) is recommended to assess the trajectory of future cognitive decline should it occur. This will also aid in future efforts towards improved diagnostic clarity.  Eric Simmons is encouraged to attend to lifestyle factors for brain health (e.g., regular physical exercise, good nutrition habits, regular participation in cognitively-stimulating activities, and general stress management techniques), which are likely to have benefits for both emotional adjustment and cognition. Optimal control of vascular risk factors (including safe cardiovascular exercise and adherence to dietary recommendations) is encouraged. Continued participation in activities which provide mental stimulation and social interaction is also recommended.   If interested, there are some activities which have therapeutic value and can be useful in keeping him cognitively stimulated. For suggestions, Eric Simmons is encouraged to go to the following website: https://www.[facility].org/get-to-know-barrow/centers-programs/neurorehabilitation-center/neuro-rehab-apps-and-games/ which has options, categorized by level of difficulty. It should be noted that these activities should not be viewed as a substitute for therapy.  Memory can be improved using internal strategies such as rehearsal, repetition, chunking, mnemonics, association, and imagery. External strategies such as written notes in a consistently used memory journal, visual and nonverbal auditory cues such as a calendar on the refrigerator or appointments with alarm, such as on a cell phone, can also help maximize recall.    Because he shows better recall for structured information, he will likely understand and retain new information better if it is presented to him in a meaningful or well-organized manner at the outset, such as grouping items into meaningful categories or presenting information in  an outlined, bulleted, or story format.   To address problems with processing speed, he may wish to consider:   -Ensuring that he is alerted when essential material or instructions are being presented   -Adjusting the speed at which new information is presented   -Allowing for more time in comprehending, processing, and responding in conversation  To address problems with working memory and executive functioning, he may wish to consider:   -Avoiding external distractions when needing to concentrate   -Limiting exposure to fast paced environments with multiple sensory demands   -Writing down complicated information and using checklists   -Attempting and completing one task at a time (i.e., no multi-tasking)   -Verbalizing aloud each step of a task to maintain focus   -Reducing the amount of information considered at one time

## 2020-10-04 ENCOUNTER — Ambulatory Visit: Payer: PPO | Admitting: Neurology

## 2020-10-17 NOTE — Progress Notes (Signed)
Assessment/Plan:    1.  Essential Tremor  -Pt could use and increase but he decided to continue primidone, 50 mg nightly for now.  Can let us know if tremor more bothersome to him.  -Not candidate for beta-blocker due to history of bradycardia  2.  MCI  -Patient has had neurocognitive testing in November, 2020 and November, 2021 with Dr. Melvyn Novas.  Both of these tests demonstrated mild neurocognitive disorder.  3.  Hand paresthesias  -likely CTS.  Talked about cock up wrist splints at night but he states wants to hold on that for now.  Will let me know if changes mind.  4.  F/u 1 year  Subjective:   Miachel Simmons. was seen today in follow up for essential tremor.  My previous records were reviewed prior to todays visit. Pt with wife who supplements hx.  Tremor has about the same - R hand worse than the L.  Doesn't want to go up on the medication.  Has trouble with handwriting and "that gets my goat."  Pt denies falls.  Pt denies lightheadedness, near syncope.  No hallucinations.  Patient had neurocognitive testing on September 13, 2020 with Dr. Melvyn Novas.  This demonstrated MCI.  Mood has been good.  [facility].  Also c/o hand paresthesias - awakens from sleep.  Goes away once moves them around.  Sometimes happens with exercise.  Has been going on x years.  Current prescribed movement disorder medications: Primidone, 50 mg nightly  ALLERGIES:   Allergies  Allergen Reactions  . Codeine     REACTION: violent vomiting    CURRENT MEDICATIONS:  Outpatient Encounter Medications as of 10/27/2020  Medication Sig  . acetaminophen (TYLENOL) 500 MG tablet Take 500 mg by mouth as needed. Reported on 12/28/2015  . cholecalciferol (VITAMIN D) 1000 units tablet Take 1,000 Units by mouth daily.  Marland Kitchen Co-Enzyme Q-10 100 MG CAPS Take 300 mg by mouth daily.  . Cyanocobalamin (VITAMIN B 12 PO) Take 1 capsule by mouth daily.  . finasteride (PROSCAR) 5 MG tablet Take 5 mg by mouth  daily.  . Glucosamine-Chondroit-Vit C-Mn (GLUCOSAMINE 1500 COMPLEX) CAPS Take 1 capsule by mouth daily.  Marland Kitchen losartan (COZAAR) 25 MG tablet Take 1 tablet (25 mg total) by mouth daily.  . metoprolol succinate (TOPROL XL) 25 MG 24 hr tablet Take 1 tablet (25 mg total) by mouth daily.  . primidone (MYSOLINE) 50 MG tablet TAKE ONE TABLET AT BEDTIME  . sildenafil (VIAGRA) 100 MG tablet Take 100 mg by mouth as needed.  . simvastatin (ZOCOR) 40 MG tablet TAKE ONE TABLET AT BEDTIME  . [DISCONTINUED] tamsulosin (FLOMAX) 0.4 MG CAPS capsule Take 1 capsule (0.4 mg total) by mouth daily. (Patient not taking: Reported on 10/27/2020)   Facility-Administered Encounter Medications as of 10/27/2020  Medication  . 0.9 %  sodium chloride infusion     Objective:    PHYSICAL EXAMINATION:    VITALS:   Vitals:   10/27/20 1257  BP: 130/68  Pulse: 62  SpO2: 99%  Weight: 176 lb (79.8 kg)  Height: 6' (1.829 m)    GEN:  The patient appears stated age and is in NAD. HEENT:  Normocephalic, atraumatic.  The mucous membranes are moist. The superficial temporal arteries are without ropiness or tenderness. CV:  RRR Lungs:  CTAB Neck/HEME:  There are no carotid bruits bilaterally.  Neurological examination:  Orientation: The patient is alert and oriented x3. Cranial nerves: There  is good facial symmetry. The speech is fluent and clear. Soft palate rises symmetrically and there is no tongue deviation. Hearing is intact to conversational tone. Sensation: Sensation is intact to light touch throughout Motor: Strength is at least antigravity x4.  Movement examination: Tone: There is normal tone in the UE/LE Abnormal movements: mild postural tremor on the R.  More moderate with writing/archimedes spirals on the R.   Coordination:  There is no decremation with RAM's, with any form of RAMS, including alternating supination and pronation of the forearm, hand opening and closing, finger taps, heel taps and toe  taps. Gait and Station: The patient has no difficulty arising out of a deep-seated chair without the use of the hands. The patient's stride length is good I have reviewed and interpreted the following labs independently   Chemistry      Component Value Date/Time   NA 138 04/14/2020 1619   K 4.3 04/14/2020 1619   CL 104 04/14/2020 1619   CO2 19 (L) 04/14/2020 1619   BUN 19 04/14/2020 1619   CREATININE 1.01 04/14/2020 1619   CREATININE 1.11 10/30/2017 0944      Component Value Date/Time   CALCIUM 9.3 04/14/2020 1619   ALKPHOS 94 12/09/2019 0836   AST 25 12/09/2019 0836   ALT 21 12/09/2019 0836   BILITOT 0.5 12/09/2019 0836      Lab Results  Component Value Date   WBC 6.4 11/11/2018   HGB 14.7 11/11/2018   HCT 43.2 11/11/2018   MCV 88 11/11/2018   PLT 237 11/11/2018   Lab Results  Component Value Date   TSH 1.230 11/11/2018     Chemistry      Component Value Date/Time   NA 138 04/14/2020 1619   K 4.3 04/14/2020 1619   CL 104 04/14/2020 1619   CO2 19 (L) 04/14/2020 1619   BUN 19 04/14/2020 1619   CREATININE 1.01 04/14/2020 1619   CREATININE 1.11 10/30/2017 0944      Component Value Date/Time   CALCIUM 9.3 04/14/2020 1619   ALKPHOS 94 12/09/2019 0836   AST 25 12/09/2019 0836   ALT 21 12/09/2019 0836   BILITOT 0.5 12/09/2019 0836         Total time spent on today's visit was 25 minutes, including both face-to-face time and nonface-to-face time.  Time included that spent on review of records (prior notes available to me/labs/imaging if pertinent), discussing treatment and goals, answering patient's questions and coordinating care.  Cc:  [name], MD

## 2020-10-27 ENCOUNTER — Other Ambulatory Visit: Payer: Self-pay

## 2020-10-27 ENCOUNTER — Encounter: Payer: Self-pay | Admitting: Neurology

## 2020-10-27 ENCOUNTER — Ambulatory Visit: Payer: PPO | Admitting: Neurology

## 2020-10-27 VITALS — BP 130/68 | HR 62 | Ht 72.0 in | Wt 176.0 lb

## 2020-10-27 DIAGNOSIS — R202 Paresthesia of skin: Secondary | ICD-10-CM | POA: Diagnosis not present

## 2020-10-27 DIAGNOSIS — G250 Essential tremor: Secondary | ICD-10-CM

## 2020-10-27 DIAGNOSIS — G3184 Mild cognitive impairment, so stated: Secondary | ICD-10-CM

## 2020-10-27 NOTE — Patient Instructions (Signed)
1.  Let us know if you decide you want to increase your primidone for worsening tremor 2.  Let us know if you want a RX for wrist splint for the hand numbness at night  The physicians and staff at [facility] are committed to providing excellent care. You may receive a survey requesting feedback about your experience at our office. We strive to receive "very good" responses to the survey questions. If you feel that your experience would prevent you from giving the office a "very good " response, please contact our office to try to remedy the situation. We may be reached at [phone]. Thank you for taking the time out of your busy day to complete the survey.

## 2020-10-31 ENCOUNTER — Encounter: Payer: Self-pay | Admitting: Neurology

## 2020-11-01 DIAGNOSIS — M1612 Unilateral primary osteoarthritis, left hip: Secondary | ICD-10-CM | POA: Diagnosis not present

## 2020-11-01 DIAGNOSIS — M25552 Pain in left hip: Secondary | ICD-10-CM | POA: Diagnosis not present

## 2020-12-26 ENCOUNTER — Other Ambulatory Visit: Payer: Self-pay | Admitting: Family Medicine

## 2020-12-26 DIAGNOSIS — E7800 Pure hypercholesterolemia, unspecified: Secondary | ICD-10-CM

## 2021-01-04 NOTE — Progress Notes (Signed)
Chief Complaint  Patient presents with  . Hypertension    His blood pressure readings have been all over the place-but mostly elevated. Yesterday it was 193/108. [facility] (said he came to see you 3 months ago for this), felt like this again yesterday.     Patient had cancelled his physical for this morning, and rescheduled to an afternoon appointment just to discuss his blood pressure.  He is concerned due to high blood pressures at home.  Hypertension: He is compliant with losartan (34m in the evening) and metoprolol 259min the morning.   BP's are running 152-193/91-105 (these readings start from 2/28, checked daily) These are at various times of the day. Highest was yesterday, 193/105, recalls being in a lot of pain.  He has been having a lot more L hip pain, especially when active, up to 9/10 intensity.  He had an injection 3 weeks ago, only helped temporarily. Thinks he may need surgery. Current pain is 3-4/10. Pain is worse at night, and uses tylenol when needed, and it helps some.  Eating more soups (homemade or canned tomato basil). +processed meats for lunch.  He has seen Dr. TuRadford Paxnd had eval for DOE with echo (normal LVF) and stress test (low risk study).  DOE improved with improved BP control. He hasn't had any DOE. [facility]. This is sporadic, once every couple of weeks. Hasn't checked BP when had the pain. His BP monitor was verified as accurate 12/2019.  Pt also has hyperlipidemia and impaired fasting glucose, and is due for fasting labs.   PMH, PSH, SH reviewed  Outpatient Encounter Medications as of 01/05/2021  Medication Sig Note  . cholecalciferol (VITAMIN D) 1000 units tablet Take 1,000 Units by mouth daily.   . Marland Kitcheno-Enzyme Q-10 100 MG CAPS Take 300 mg by mouth daily.   . Cyanocobalamin (VITAMIN B 12 PO) Take 1 capsule by mouth  daily.   . finasteride (PROSCAR) 5 MG tablet Take 5 mg by mouth daily.   . Glucosamine-Chondroit-Vit C-Mn (GLUCOSAMINE 1500 COMPLEX) CAPS Take 1 capsule by mouth daily.   . Marland Kitchenosartan (COZAAR) 25 MG tablet Take 1 tablet (25 mg total) by mouth daily. 01/05/2021: At night  . metoprolol succinate (TOPROL XL) 25 MG 24 hr tablet Take 1 tablet (25 mg total) by mouth daily. 01/05/2021: In the morning  . primidone (MYSOLINE) 50 MG tablet TAKE ONE TABLET AT BEDTIME   . simvastatin (ZOCOR) 40 MG tablet TAKE ONE TABLET AT BEDTIME   . acetaminophen (TYLENOL) 500 MG tablet Take 500 mg by mouth as needed. Reported on 12/28/2015 (Patient not taking: Reported on 01/05/2021) 01/05/2021: Hip pain-takes at night for this  . sildenafil (VIAGRA) 100 MG tablet Take 100 mg by mouth as needed. (Patient not taking: Reported on 01/05/2021)    Facility-Administered Encounter Medications as of 01/05/2021  Medication  . 0.9 %  sodium chloride infusion   Allergies  Allergen Reactions  . Codeine     REACTION: violent vomiting   ROS: no fever, chills, headaches, dizziness. Intermittent CP when at the gym, per HPI, short-lived.  No nausea, vomiting, bowel changes, edema, bleeding, bruising, mood changes, weight changes, or any other concerns.    PHYSICAL EXAM:  BP 120/82   Pulse (!) 56   Ht 6' (1.829 m)   Wt 177 lb 3.2 oz (80.4 kg)   BMI  24.03 kg/m   Wt Readings from Last 3 Encounters:  01/05/21 177 lb 3.2 oz (80.4 kg)  10/27/20 176 lb (79.8 kg)  07/06/20 174 lb 3.2 oz (79 kg)   Well-appearing, pleasant, elderly male in no distress HEENT: conjunctiva and sclera are clear, EOMI, wearing mask Neck: no lymphadenopathy, thyromegaly or carotid bruit Heart: regular rate and rhythm Lungs: clear bilaterally Extremities: no edema Psych: normal mood, affect, hygiene and grooming Neuro: alert and oriented.   ASSESSMENT/PLAN:  Essential hypertension - Normal BP in office, very high at home. Suspect hip pain contributing.   Low Na diet reviewed, pain control, monitor BP. Cont current meds - Plan: losartan (COZAAR) 25 MG tablet, Comprehensive metabolic panel  Primary osteoarthritis of left hip - Pain likely contributing to higher BP's. Tylenol is partially effective, encouraged regular use and f/u with ortho  Pure hypercholesterolemia - Plan: Lipid panel  IFG (impaired fasting glucose) - Plan: Hemoglobin A1c, Comprehensive metabolic panel  Medication monitoring encounter - Plan: CBC with Differential/Platelet, Lipid panel, Comprehensive metabolic panel   F/u for CPE/AWV with labs prior, ordered A1c, cbc, c-met, lipids   Please change to Tylenol Arthritis--it lasts longer than regular Tylenol. Use it on a more regular basis to prevent pain, rather than only when pain is severe. You may take it three times everyday, if needed.  Definitely take it at bedtime, and in the afternoon when you pain typically starts.  Monitor your blood pressure on the paper provided--checking when you are pain-free, as well as at other times of the day.  If the blood pressure is very high, take your tylenol and recheck the blood pressure when pain has improve (record both values).  You should also read your food labels and try and cut back on the sodium in your diet . This also contributes to higher blood pressures. Canned foods and processed foods (ie lunch meats) are high in sodium.   I spent 38 minutes dedicated to the care of this patient, including pre-visit review of records, face to face time, post-visit ordering of testing and documentation.

## 2021-01-05 ENCOUNTER — Ambulatory Visit (INDEPENDENT_AMBULATORY_CARE_PROVIDER_SITE_OTHER): Payer: PPO | Admitting: Family Medicine

## 2021-01-05 ENCOUNTER — Encounter: Payer: Self-pay | Admitting: Family Medicine

## 2021-01-05 ENCOUNTER — Ambulatory Visit: Payer: PPO | Admitting: Family Medicine

## 2021-01-05 ENCOUNTER — Telehealth: Payer: Self-pay

## 2021-01-05 ENCOUNTER — Other Ambulatory Visit: Payer: Self-pay

## 2021-01-05 VITALS — BP 120/82 | HR 56 | Ht 72.0 in | Wt 177.2 lb

## 2021-01-05 DIAGNOSIS — M1612 Unilateral primary osteoarthritis, left hip: Secondary | ICD-10-CM | POA: Diagnosis not present

## 2021-01-05 DIAGNOSIS — I10 Essential (primary) hypertension: Secondary | ICD-10-CM

## 2021-01-05 DIAGNOSIS — Z5181 Encounter for therapeutic drug level monitoring: Secondary | ICD-10-CM

## 2021-01-05 DIAGNOSIS — R7301 Impaired fasting glucose: Secondary | ICD-10-CM

## 2021-01-05 DIAGNOSIS — E7800 Pure hypercholesterolemia, unspecified: Secondary | ICD-10-CM

## 2021-01-05 MED ORDER — LOSARTAN POTASSIUM 25 MG PO TABS: 25.0000 mg | ORAL_TABLET | Freq: Every day | ORAL | 0 refills | Status: DC

## 2021-01-05 NOTE — Telephone Encounter (Signed)
Thanks.  Lab orders were already entered, done at his visit.

## 2021-01-05 NOTE — Telephone Encounter (Signed)
I got Eric Simmons scheduled for a MWV  per your check out instructions on 03/06/21 and scheduled for fasting labs for that [place] on 03/03/21 if you could put in the orders for his lab work. Thank you.

## 2021-01-05 NOTE — Patient Instructions (Addendum)
Please change to Tylenol Arthritis--it lasts longer than regular Tylenol. Use it on a more regular basis to prevent pain, rather than only when pain is severe. You may take it three times everyday, if needed.  Definitely take it at bedtime, and in the afternoon when you pain typically starts.  Monitor your blood pressure on the paper provided--checking when you are pain-free, as well as at other times of the day.  If the blood pressure is very high, take your tylenol and recheck the blood pressure when pain has improve (record both values).  You should also read your food labels and try and cut back on the sodium in your diet . This also contributes to higher blood pressures. Canned foods and processed foods (ie lunch meats) are high in sodium.  [facility] have any [facility].  If it is high, hopefully this will resolve with getting the blood pressure down (if controlling the pain helps). If your pain persists, you should follow up with Dr. Radford Pax.    Cooking With Less Salt Cooking with less salt is one way to reduce the amount of sodium you get from food. Sodium is one of the elements that make up salt. It is found naturally in foods and is also added to certain foods. Depending on your condition and overall health, your health care provider or dietitian may recommend that you reduce your sodium intake. Most people should have less than 2,300 milligrams (mg) of sodium each day. If you have high blood pressure (hypertension), you may need to limit your sodium to 1,500 mg each day. Follow the tips below to help reduce your sodium intake. What are tips for eating less sodium? Reading food labels  Check the food label before buying or using packaged ingredients. Always check the label for the serving size and sodium content.  Look for products with no more than 140 mg of sodium in one serving.  Check the % Daily Value column to see what percent of the daily  recommended amount of sodium is provided in one serving of the product. Foods with 5% or less in this column are considered low in sodium. Foods with 20% or higher are considered high in sodium.  Do not choose foods with salt as one of the first three ingredients on the ingredients list. If salt is one of the first three ingredients, it usually means the item is high in sodium.   Shopping  Buy sodium-free or low-sodium products. Look for the following words on food labels: ? Low-sodium. ? Sodium-free. ? Reduced-sodium. ? No salt added. ? Unsalted.  Always check the sodium content even if foods are labeled as low-sodium or no salt added.  Buy fresh foods. Cooking  Use herbs, seasonings without salt, and spices as substitutes for salt.  Use sodium-free baking soda when baking.  Grill, braise, or roast foods to add flavor with less salt.  Avoid adding salt to pasta, rice, or hot cereals.  Drain and rinse canned vegetables, beans, and meat before use.  Avoid adding salt when cooking sweets and desserts.  Cook with low-sodium ingredients. What foods are high in sodium? Vegetables Regular canned vegetables (not low-sodium or reduced-sodium). Sauerkraut, pickled vegetables, and relishes. Olives. [place] fries. Onion rings. Regular canned tomato sauce and paste. Regular tomato and vegetable juice. Frozen vegetables in sauces. Grains Instant hot cereals. Bread stuffing, pancake, and biscuit mixes. Croutons. Seasoned rice or pasta mixes. Noodle soup  cups. Boxed or frozen macaroni and cheese. Regular salted crackers. Self-rising flour. Rolls. Bagels. Flour tortillas and wraps. Meats and other proteins Meat or fish that is salted, canned, smoked, cured, spiced, or pickled. This includes bacon, ham, sausages, hot dogs, corned beef, chipped beef, meat loaves, salt pork, jerky, pickled herring, anchovies, regular canned tuna, and sardines. Salted nuts. Dairy Processed cheese and cheese spreads.  Cheese curds. Blue cheese. Feta cheese. String cheese. Regular cottage cheese. Buttermilk. Canned milk. The items listed above may not be a complete list of foods high in sodium. Actual amounts of sodium may be different depending on processing. Contact a dietitian for more information. What foods are low in sodium? Fruits Fresh, frozen, or canned fruit with no sauce added. Fruit juice. Vegetables Fresh or frozen vegetables with no sauce added. "No salt added" canned vegetables. "No salt added" tomato sauce and paste. Low-sodium or reduced-sodium tomato and vegetable juice. Grains Noodles, pasta, quinoa, rice. Shredded or puffed wheat or puffed rice. Regular or quick oats (not instant). Low-sodium crackers. Low-sodium bread. Whole-grain bread and whole-grain pasta. Unsalted popcorn. Meats and other proteins Fresh or frozen whole meats, poultry (not injected with sodium), and fish with no sauce added. Unsalted nuts. Dried peas, beans, and lentils without added salt. Unsalted canned beans. Eggs. Unsalted nut butters. Low-sodium canned tuna or chicken. Dairy Milk. Soy milk. Yogurt. Low-sodium cheeses, such as Swiss, Monterey Jack, [place], and [employer]. Sherbet or ice cream (keep to  cup per serving). Cream cheese. Fats and oils Unsalted butter or margarine. Other foods Homemade pudding. Sodium-free baking soda and baking powder. Herbs and spices. Low-sodium seasoning mixes. Beverages Coffee and tea. Carbonated beverages. The items listed above may not be a complete list of foods low in sodium. Actual amounts of sodium may be different depending on processing. Contact a dietitian for more information. What are some salt alternatives when cooking? The following are herbs, seasonings, and spices that can be used instead of salt to flavor your food. Herbs should be fresh or dried. Do not choose packaged mixes. Next to the name of the herb, spice, or seasoning are some examples of foods you can pair  it with. Herbs  Bay leaves - Soups, meat and vegetable dishes, and spaghetti sauce.  Basil - [employer], soups, pasta, and fish dishes.  Cilantro - Meat, poultry, and vegetable dishes.  Chili powder - Marinades and Mexican dishes.  Chives - Salad dressings and potato dishes.  Cumin - Mexican dishes, couscous, and meat dishes.  Dill - Fish dishes, sauces, and salads.  Fennel - Meat and vegetable dishes, breads, and cookies.  Garlic (do not use garlic salt) - [place] dishes, meat dishes, salad dressings, and sauces.  Marjoram - Soups, potato dishes, and meat dishes.  Oregano - Pizza and spaghetti sauce.  Parsley - Salads, soups, pasta, and meat dishes.  Rosemary - [place] dishes, salad dressings, soups, and red meats.  Saffron - Fish dishes, pasta, and some poultry dishes.  Sage - Stuffings and sauces.  Tarragon - Fish and [employer].  Thyme - Stuffing, meat, and fish dishes. Seasonings  Lemon juice - Fish dishes, poultry dishes, vegetables, and salads.  Vinegar - Salad dressings, vegetables, and fish dishes. Spices  Cinnamon - Sweet dishes, such as cakes, cookies, and puddings.  Cloves - Gingerbread, puddings, and marinades for meats.  Curry - Vegetable dishes, fish and poultry dishes, and stir-fry dishes.  Ginger - Vegetable dishes, fish dishes, and stir-fry dishes.  Nutmeg - Pasta, vegetables, poultry,  fish dishes, and custard. Summary  Cooking with less salt is one way to reduce the amount of sodium that you get from food.  Buy sodium-free or low-sodium products.  Check the food label before using or buying packaged ingredients.  Use herbs, seasonings without salt, and spices as substitutes for salt in foods. This information is not intended to replace advice given to you by your health care provider. Make sure you discuss any questions you have with your health care provider. Document Revised: 10/07/2019 Document Reviewed: 10/07/2019 Elsevier  Patient Education  [place].

## 2021-01-09 DIAGNOSIS — D1801 Hemangioma of skin and subcutaneous tissue: Secondary | ICD-10-CM | POA: Diagnosis not present

## 2021-01-09 DIAGNOSIS — M1612 Unilateral primary osteoarthritis, left hip: Secondary | ICD-10-CM | POA: Diagnosis not present

## 2021-01-09 DIAGNOSIS — L812 Freckles: Secondary | ICD-10-CM | POA: Diagnosis not present

## 2021-01-09 DIAGNOSIS — L821 Other seborrheic keratosis: Secondary | ICD-10-CM | POA: Diagnosis not present

## 2021-01-09 DIAGNOSIS — M6740 Ganglion, unspecified site: Secondary | ICD-10-CM | POA: Diagnosis not present

## 2021-01-09 DIAGNOSIS — Z85828 Personal history of other malignant neoplasm of skin: Secondary | ICD-10-CM | POA: Diagnosis not present

## 2021-01-09 DIAGNOSIS — L570 Actinic keratosis: Secondary | ICD-10-CM | POA: Diagnosis not present

## 2021-01-26 ENCOUNTER — Other Ambulatory Visit: Payer: Self-pay | Admitting: Neurology

## 2021-02-27 DIAGNOSIS — M1612 Unilateral primary osteoarthritis, left hip: Secondary | ICD-10-CM | POA: Diagnosis not present

## 2021-03-03 ENCOUNTER — Other Ambulatory Visit: Payer: PPO

## 2021-03-06 ENCOUNTER — Ambulatory Visit: Payer: PPO | Admitting: Family Medicine

## 2021-03-16 ENCOUNTER — Other Ambulatory Visit: Payer: Self-pay | Admitting: Orthopaedic Surgery

## 2021-03-16 DIAGNOSIS — Z01818 Encounter for other preprocedural examination: Secondary | ICD-10-CM

## 2021-03-17 ENCOUNTER — Telehealth: Payer: Self-pay | Admitting: Cardiology

## 2021-03-17 NOTE — Telephone Encounter (Signed)
Left message for pt to call and make appt for pre op clearance. I then called his wife (DPR) as schedule are filling up very quickly. S/w pt's wife and explained that the pt will need a pre op before he can be cleared. Pt's wife is agreeable to plan of care. Per pt's wife I scheduled the appt with Dr. Radford Pax 5/31 @ 11:40. I will send notes to Dr. Theodosia Blender nurse Bethann Berkshire for upcoming appt. Will send FYI to surgeon's office pt has appt 03/28/21.

## 2021-03-17 NOTE — Telephone Encounter (Signed)
New message      [facility] Pre-operative Risk Assessment    Patient Name: Eric Simmons.  DOB: 07-Oct-1941  MRN: [id number]   HEARTCARE STAFF: - Please ensure there is not already an duplicate clearance open for this procedure. - Under Visit Info/Reason for Call, type in Other and utilize the format Clearance MM/DD/YY or Clearance TBD. Do not use dashes or single digits. - If request is for dental extraction, please clarify the # of teeth to be extracted.  Request for surgical clearance:  1. What type of surgery is being performed? Left hip replacement  2. When is this surgery scheduled? April 18 2021  3. What type of clearance is required (medical clearance vs. Pharmacy clearance to hold med vs. Both)? Medical clearance and hold medication if dr turner says so  4. Are there any medications that need to be held prior to surgery and how long? To be determined by doctor  5. Practice name and name of physician performing surgery? Dr Melrose Nakayama  6. What is the office phone number? [phone]   7.   What is the office fax number? [phone]  8.   Anesthesia type (None, local, MAC, general) ?  spinal   Earnestine Mealing 03/17/2021, 12:32 PM  _________________________________________________________________   (provider comments below)

## 2021-03-17 NOTE — Telephone Encounter (Signed)
PT RETURNING DR. Landis Gandy NURSE PHONE CALL IN REGARDS TO AN APPT

## 2021-03-17 NOTE — Telephone Encounter (Signed)
Primary Cardiologist:Traci Turner, MD  Chart reviewed as part of pre-operative protocol coverage. Because of Eric LOVING Jr.'s past medical history and time since last visit, he/she will require a follow-up visit in order to better assess preoperative cardiovascular risk.  Pre-op covering staff: - Please schedule appointment and call patient to inform them. - Please contact requesting surgeon's office via preferred method (i.e, phone, fax) to inform them of need for appointment prior to surgery.  If applicable, this message will also be routed to pharmacy pool and/or primary cardiologist for input on holding anticoagulant/antiplatelet agent as requested below so that this information is available at time of patient's appointment.   Eric Pelton, NP  03/17/2021, 12:52 PM

## 2021-03-27 NOTE — Patient Instructions (Addendum)
  HEALTH MAINTENANCE RECOMMENDATIONS:  It is recommended that you get at least 30 minutes of aerobic exercise at least 5 days/week (for weight loss, you may need as much as 60-90 minutes). This can be any activity that gets your heart rate up. This can be divided in 10-15 minute intervals if needed, but try and build up your endurance at least once a week.  Weight bearing exercise is also recommended twice weekly.  Eat a healthy diet with lots of vegetables, fruits and fiber.  "Colorful" foods have a lot of vitamins (ie green vegetables, tomatoes, red peppers, etc).  Limit sweet tea, regular sodas and alcoholic beverages, all of which has a lot of calories and sugar.  Up to 2 alcoholic drinks daily may be beneficial for men (unless trying to lose weight, watch sugars).  Drink a lot of water.  Sunscreen of at least SPF 30 should be used on all sun-exposed parts of the skin when outside between the hours of 10 am and 4 pm (not just when at beach or pool, but even with exercise, golf, tennis, and yard work!)  Use a sunscreen that says "broad spectrum" so it covers both UVA and UVB rays, and make sure to reapply every 1-2 hours.  Remember to change the batteries in your smoke detectors when changing your clock times in the spring and fall.  Carbon monoxide detectors are recommended for your home.  Use your seat belt every time you are in a car, and please drive safely and not be distracted with cell phones and texting while driving.    Mr. Hellenbrand , Thank you for taking time to come for your Medicare Wellness Visit. I appreciate your ongoing commitment to your health goals. Please review the following plan we discussed and let me know if I can assist you in the future.    This is a list of the screening recommended for you and due dates:  Health Maintenance  Topic Date Due  . Zoster (Shingles) Vaccine (1 of 2) Never done  . Tetanus Vaccine  03/01/2021  . Flu Shot  05/29/2021  . COVID-19 Vaccine   Completed  . Pneumonia vaccines  Completed  . HPV Vaccine  Aged Out    Let Dr. Diona Fanti know that the Viagra is no longer effective.  You can discuss other treatment options.  One suggestion would be daily Cialis, as this can help with both BPH (enlarged prostate) and erectile dysfunction.  If this isn't effective, then they can discuss other treatment options (injections, vacuum, etc).  Please go to your pharmacy to get your tetanus (TdaP) in 2 weeks (must wait 2 weeks from today, due to getting the COVID vaccine today).  I also recommend that you get the new shingles vaccine (Shingrix). Since you have Medicare, you will need to get this from the pharmacy, as it is covered by Part D.  This is a series of 2 injections, spaced 2 months apart.  You need to wait at least 2-4 weeks after any other vaccines (okay to wait until you've recovered from surgery).  Please get [place] a copy of your living will and healthcare power of attorney at your convenience, so we can get it scanned into your chart.

## 2021-03-27 NOTE — Progress Notes (Signed)
Chief Complaint  Patient presents with  . Medicare Wellness    Fasting AWV/CPE (blood was drawn this am). Has form for surgical clearance. He isn't sure if he wants to get booster and Tdap in 2 weeks.     Eric Simmons. is a 80 y.o. male who presents for annual physical exam, Medicare wellness visit and follow-up on chronic medical conditions.  He saw Dr. Radford Pax yesterday for cardiac clearance, and is to get pre-op medical clearance from PCP for L total hip replacement planned by Dr. Rhona Raider on 04/18/21.  Hypertension: He is compliant with losartan(in the evening)and metoprolol 35m in the morning. His BP was normal at March 2022 office visit, but he had reported higher values at home.  Pain is also a factor with his BP. At that time he had been eating processed meats for lunch, and more soups, and we discussed low sodium diet. His BP was elevated at visit with cardiologist yesterday, and losartan dose was increased to 544m  They set him up for b-met in a week, and to see PharmD for BP f/u in 2 weeks. He cut back some on the salt in his diet. BP today is good.  He had previously seen Dr. TuRadford Paxnd had eval for DOE with echo (normal LVF) and stress test (low risk study). DOE improved with improved BP control.  He hasn't had any DOE or chest pain. He saw Dr. TuRadford Paxesterday, and got clearance for surgery.  As stated above, losartan dose was increased for better BP control.  He is under the care of Dr. TaCarles Colletor essential tremor and memory concerns. He is on primidone for tremor. He had neuropsych evaluation with Dr. MeMelvyn Novasconsistent with mid cognitive impairment.  No medications were recommended.  Recommended physical and mental exercise. He reports that is memory is "maybe a little worse than it used to be", mainly with people's names. He last saw Dr. TaCarles Colletn late 09/2020. He declined increase in primidone dose, though still has tremor which mainly bothers him related with  handwriting.  Hyperlipidemia follow-up: Patient is reportedly following a low-fat, low cholesterol diet. Compliant with medications (simvastatin 4049mand denies medication side effects. He is due for recheck. Lab Results  Component Value Date   CHOL 149 12/09/2019   HDL 47 12/09/2019   LDLCALC 82 12/09/2019   TRIG 108 12/09/2019   CHOLHDL 3.2 12/09/2019    Impaired fasting glucose. This has been very mild, ongoing/nonprogressive for years. He continues to exercise regularly (somewhat limited due to hip OA recently).He tries to limit his sweets. No polydipsia, polyuria (just nocturia). Lab Results  Component Value Date   HGBA1C 6.0 (A) 07/06/2020   Left hip pain from advanced OA.Cortisone injections had previously been helpful, but not any longer. [facility] DalRhona RaiderBPH and ED:  He saw Dr. DahDiona Fantist in 08/2020.  At that time, tamsulosin was stopped due to dizziness, and he was started on finasteride 5mg38me reports this is working very well for him.  He admits that he doesn't take it every day, just prn (about 3-4x/week) He was told to continue sildenafil 100mg34m for ED. He finds that the 100mg 109mo longer effective. He gets up 3x/night to void (which is an improvement, had been 4-5x).  Immunization History  Administered Date(s) Administered  . Fluad Quad(high Dose 65+) 07/16/2019, 07/06/2020  . Influenza Split 07/18/2011, 08/28/2012  . Influenza, High Dose Seasonal PF 08/19/2014, 06/22/2015,  08/20/2016, 08/20/2017  . Influenza,inj,Quad PF,6+ Mos 07/06/2013  . Influenza-Unspecified 07/25/2018  . PFIZER(Purple Top)SARS-COV-2 Vaccination 11/18/2019, 12/07/2019, 08/02/2020  . Pneumococcal Conjugate-13 06/14/2014  . Pneumococcal Polysaccharide-23 12/13/2011  . Tdap 03/02/2011  . Zoster, Live 03/25/2014   Last colonoscopy: 12/2017 (Dr. Barnett Abu adenomas x 4. He was told no follow up was needed (due to age) Last PSA: (no longer done due  to age) Lab Results  Component Value Date   PSA 1.5 10/30/2017   PSA 1.73 12/27/2015   PSA 1.87 12/06/2014   Dentist: twice yearly  Ophtho:yearly (to two doctors) Exercise: walking 1.5 miles on cushioned track at [place]. Weights 3x/week.  A little walking in the neighborhood (0.5-1 mile daily).  Other doctors caring for patient include: Ophtho-- [place] [facility] ,Dr. Valetta Close Retinal specialist (cannot recall name, on [facility]) Gastroenterologist: Dr. [name]  Dermatologist: Dr. Dian Situ Jones(2x/year) Dentist: Dr. Lonia Chimera  Ortho: Dr. Rhona Raider Neuro: Dr. Carles Collet (Dr. Melvyn Novas did neuropsych testing) Cardiologist:  Dr. Radford Pax Urologist: Dr. Diona Fanti PT:  Dr. Leo Grosser  Depression screen:negative Fall screen: negative Functional Status Survey:Notable for memory concerns, mainly with names. Some trouble with stairs related to his hip pain. Some leakage of urine, infrequent.  Mini-Cog screen:  Normal recall; clock drawing notable for tremor.  Numbers were written on the outside of the clock, and no hands were on the clock, and when asked, arrows were put on the outside of the clock pointing to the numbers. See full questionnaires in epic.   End of Life Discussion: Patient hasa living will and medical power of attorney   PMH, PSH, [place] and FH reviewed and updated  Outpatient Encounter Medications as of 03/29/2021  Medication Sig Note  . acetaminophen (TYLENOL) 500 MG tablet Take 500 mg by mouth as needed. Reported on 12/28/2015 01/05/2021: Hip pain-takes at night for this  . cholecalciferol (VITAMIN D) 1000 units tablet Take 1,000 Units by mouth daily.   Marland Kitchen Co-Enzyme Q-10 100 MG CAPS Take 300 mg by mouth daily.   . Cyanocobalamin (VITAMIN B 12 PO) Take 1 capsule by mouth daily.   . finasteride (PROSCAR) 5 MG tablet Take 5 mg by mouth daily. 03/29/2021: Admits to taking this prn, about 3-4 times/week  . Glucosamine-Chondroit-Vit C-Mn (GLUCOSAMINE 1500 COMPLEX) CAPS Take  1 capsule by mouth daily.   Marland Kitchen losartan (COZAAR) 50 MG tablet Take 1 tablet (50 mg total) by mouth daily.   . metoprolol succinate (TOPROL XL) 25 MG 24 hr tablet Take 1 tablet (25 mg total) by mouth daily. 01/05/2021: In the morning  . primidone (MYSOLINE) 50 MG tablet TAKE ONE TABLET AT BEDTIME   . simvastatin (ZOCOR) 40 MG tablet TAKE ONE TABLET AT BEDTIME   . sildenafil (VIAGRA) 100 MG tablet Take 100 mg by mouth as needed. (Patient not taking: Reported on 03/29/2021)   . [DISCONTINUED] losartan (COZAAR) 25 MG tablet Take 1 tablet (25 mg total) by mouth daily.    Facility-Administered Encounter Medications as of 03/29/2021  Medication  . 0.9 %  sodium chloride infusion   Allergies  Allergen Reactions  . Codeine     REACTION: violent vomiting   ROS: The patient denies anorexia, fever, weight changes, headaches, decreased hearing, ear pain, hoarseness, chest pain, palpitations, dizziness, syncope, dyspnea on exertion, cough, swelling, nausea, vomiting, diarrhea, abdominal pain, melena, hematochezia, indigestion/heartburn, hematuria, dysuria, genital lesions, joint pains, weakness, suspicious skin lesions (sees derm regularly, gets treated), depression, anxiety, insomnia, abnormal bleeding/bruising, or enlarged lymph nodes  +ED and BPH per  HPI, up 3x/night. Some tingling in R toes, and in R hand (only at night, relieved by movement) Tremor in right hand persists, despite medications, mild. Left hip pain, no other joint pains.    PHYSICAL EXAM:  BP 120/70   Pulse 64   Ht 6' (1.829 m)   Wt 174 lb 3.2 oz (79 kg)   BMI 23.63 kg/m   Wt Readings from Last 3 Encounters:  03/29/21 174 lb 3.2 oz (79 kg)  03/28/21 172 lb 12.8 oz (78.4 kg)  01/05/21 177 lb 3.2 oz (80.4 kg)    General Appearance:  Alert, cooperative, no distress, appears stated age   Head:  Normocephalic, without obvious abnormality, atraumatic   Eyes:  PERRL, conjunctiva/corneas clear, EOM's intact, fundibenign. Brown  pigmentation to upper portion of R eye (rest is blue), unchanged   Ears:  Normal TM's and external ear canals.   Nose:  Not examined, wearing mask due to COVID-19 pandemic  Throat:  Not examined, wearing mask due to COVID-19 pandemic  Neck:  Supple, no lymphadenopathy; thyroid: no enlargement/tenderness/nodules; no carotid bruit or JVD. C-spine nontender  Back:  Spine nontender, no curvature, ROM normal, no CVA tenderness   Lungs:  Clear to auscultation bilaterally without wheezes, rales or ronchi; respirations unlabored   Chest Wall:  No tenderness or deformity   Heart:  Regular rate and rhythm, S1 and S2 normal, no murmur, rub or gallop   Breast Exam: No chest wall tenderness, masses or gynecomastia   Abdomen:  Soft, non-tender, nondistended, normoactive bowel sounds, no masses, no hepatosplenomegaly   Genitalia:  Deferred to urologist  Rectal:  Deferred to urologist  Extremities:  No clubbing, cyanosis or edema.  Pulses:  2+ and symmetric all extremities   Skin:  Skin color, turgor normal, no rashes. Skin is dry, especially on arms and legs  Lymph  nodes:  Cervical, supraclavicular, inguinal and axillary nodes normal   Neurologic:  Normal strength, sensation and gait; reflexes 2+ and symmetric throughout. Very mild, fine resting tremor of RUE, more pronounced with writing.    Psych:    Normal mood, affect, hygiene and grooming    ASSESSMENT/PLAN:  Annual physical exam - Plan: POCT Urinalysis DIP (Proadvantage Device)  Medicare annual wellness visit, subsequent  Benign prostatic hyperplasia with nocturia - improved with finasteride, suggested using daily  MCI (mild cognitive impairment) - stable  Erectile dysfunction, unspecified erectile dysfunction type - sildenafil 156m doesn't help. Rec he f/u with urologist for discussion of options (daily cialis, vs injection vs pump, etc)  Essential hypertension - improved control, cont losartan 573m and home monitoring.  Monitor again verified as accurate - Plan: Basic metabolic panel, Comprehensive metabolic panel  Primary osteoarthritis of left hip - planning for THR later this month  Pure hypercholesterolemia - cont statin - Plan: Lipid panel, simvastatin (ZOCOR) 40 MG tablet  IFG (impaired fasting glucose) - reviewed proper diet, cont exercise - Plan: Comprehensive metabolic panel, Hemoglobin A1c  Essential tremor - mild, bothersome with writing, declined increase in primidone dose with Dr. TaCarles ColletMedication monitoring encounter - Plan: Comprehensive metabolic panel, Lipid panel, CBC with Differential/Platelet  Need for COVID-19 vaccine - Plan: Pfizer SARS-COV-2 Vaccine   No further PSA screening is recommended, based on age. Recommended at least 30 minutes of aerobic activity at least 5 days/week, weight bearing exercise at least 2x/week; proper sunscreen use reviewed; healthy diet and alcohol recommendations (less than or equal to 2 drinks/day) reviewed; regular seatbelt use; changing batteries in  smoke detectors. Immunization recommendations discussed--continuehigh dose flu shotsyearly. COVID booster was given today. Tdap due, to get from pharmacy after 2 weeks. Shingrix recommended; risks/SE reviewed, to get from pharmacy (after recovered from surgery).  Colonoscopy is UTD.  MOST form reviewed and updated. Full Code, Full Care.     Medicare Attestation I have personally reviewed: The patient's medical and social history Their use of alcohol, tobacco or illicit drugs Their current medications and supplements The patient's functional ability including ADLs,fall risks, home safety risks, cognitive, and hearing and visual impairment Diet and physical activities Evidence for depression or mood disorders  The patient's weight, height, BMI have been recorded in the chart.  I have made referrals, counseling, and provided education to the patient based on review of the above and  I have provided the patient with a written personalized care plan for preventive services.

## 2021-03-28 ENCOUNTER — Ambulatory Visit: Payer: PPO | Admitting: Cardiology

## 2021-03-28 ENCOUNTER — Other Ambulatory Visit: Payer: Self-pay

## 2021-03-28 ENCOUNTER — Encounter: Payer: Self-pay | Admitting: Cardiology

## 2021-03-28 VITALS — BP 146/86 | HR 57 | Ht 72.0 in | Wt 172.8 lb

## 2021-03-28 DIAGNOSIS — I10 Essential (primary) hypertension: Secondary | ICD-10-CM | POA: Diagnosis not present

## 2021-03-28 DIAGNOSIS — R0602 Shortness of breath: Secondary | ICD-10-CM

## 2021-03-28 DIAGNOSIS — M25552 Pain in left hip: Secondary | ICD-10-CM | POA: Diagnosis not present

## 2021-03-28 DIAGNOSIS — Z01810 Encounter for preprocedural cardiovascular examination: Secondary | ICD-10-CM

## 2021-03-28 DIAGNOSIS — E7800 Pure hypercholesterolemia, unspecified: Secondary | ICD-10-CM | POA: Diagnosis not present

## 2021-03-28 DIAGNOSIS — Z01818 Encounter for other preprocedural examination: Secondary | ICD-10-CM

## 2021-03-28 MED ORDER — LOSARTAN POTASSIUM 50 MG PO TABS: 50.0000 mg | ORAL_TABLET | Freq: Every day | ORAL | 3 refills | Status: DC

## 2021-03-28 NOTE — Patient Instructions (Signed)
Medication Instructions:  Your physician has recommended you make the following change in your medication:  1) INCREASE losartan to 50 mg daily  *If you need a refill on your cardiac medications before your next appointment, please call your pharmacy*   Lab Work: BMET in one week If you have labs (blood work) drawn today and your tests are completely normal, you will receive your results only by: Marland Kitchen MyChart Message (if you have MyChart) OR . A paper copy in the mail If you have any lab test that is abnormal or we need to change your treatment, we will call you to review the results.  Follow-Up: At [facility], you and your health needs are our priority.  As part of our continuing mission to provide you with exceptional heart care, we have created designated Provider Care Teams.  These Care Teams include your primary Cardiologist (physician) and Advanced Practice Providers (APPs -  Physician Assistants and Nurse Practitioners) who all work together to provide you with the care you need, when you need it.  Your next appointment:   1 year(s)  The format for your next appointment:   In Person  Provider:   You may see [name], MD or one of the following Advanced Practice Providers on your designated Care Team:    Melina Copa, PA-C  Ermalinda Barrios, PA-C  Other Instructions: You have been referred to see our Pharmacist in the Hypertension clinic in 2-3 weeks

## 2021-03-28 NOTE — Progress Notes (Signed)
Cardiology Office Note    Date:  03/28/2021   ID:  Eric Simmons., DOB 1941/01/05, MRN [id number]  PCP:  [name], MD  Cardiologist:  [name], MD   Chief Complaint  Patient presents with  . Hypertension  . Hyperlipidemia    History of Present Illness:  Eric Simmons. is a 80 y.o. male with a hx of HLD who recently saw his PCP and complained of DOE and was referred to me for further evaulation. At his initial OV he told me that he had noticed that his BP was elevated when he exerted himself.  Around the same time he started noticing DOE and chest discomfort with exertion as well.  He used to be an avid walking and walked 2 miles 4 times weekly but had recently  noticed DOE and chest tightness working in the yard and walking. His father died at 21 of a massive MI. He underwent nuclear stress test showing no ischemia and normal LVF.  2D echo showed normal LVF and mildly leaky MV.    He is here today for followup and is doing well.  He denies any chest pain or pressure, SOB, DOE, PND, orthopnea, LE edema, dizziness, palpitations or syncope. He is compliant with his meds and is tolerating meds with no SE.  He has had problems recently with his hip and needs preoperative cardiac clearance for surgery.   Past Medical History:  Diagnosis Date  . Adenomatous colon polyp 03/2005   tubular adenoma  . BCC (basal cell carcinoma), face    left face, R ear (04/2014), nose 05/2016  . Benign familial tremor 08/29/2012  . Erectile dysfunction   . Hemorrhoids    internal and external  . Hip arthritis    left  . IFG (impaired fasting glucose) 12/10/2012   Glucose 111 11/2012   . Impaired fasting glucose    since his 20's; had yearly GTT's for 5 yrs, never progressed  . Insomnia   . Mild neurocognitive disorder 09/11/2019  . Pseudogout of knee 07/2003   left  . Pure hypercholesterolemia     Past Surgical History:  Procedure Laterality Date  . COLONOSCOPY  6/06, 05/2010   Dr.  [name]  . MOHS SURGERY  6/07   L face, Dr. Link Snuffer  . POLYPECTOMY    . TONSILLECTOMY    . VASECTOMY      Current Medications: Current Meds  Medication Sig  . acetaminophen (TYLENOL) 500 MG tablet Take 500 mg by mouth as needed. Reported on 12/28/2015  . cholecalciferol (VITAMIN D) 1000 units tablet Take 1,000 Units by mouth daily.  Marland Kitchen Co-Enzyme Q-10 100 MG CAPS Take 300 mg by mouth daily.  . Cyanocobalamin (VITAMIN B 12 PO) Take 1 capsule by mouth daily.  . finasteride (PROSCAR) 5 MG tablet Take 5 mg by mouth daily.  . Glucosamine-Chondroit-Vit C-Mn (GLUCOSAMINE 1500 COMPLEX) CAPS Take 1 capsule by mouth daily.  Marland Kitchen losartan (COZAAR) 25 MG tablet Take 1 tablet (25 mg total) by mouth daily.  . metoprolol succinate (TOPROL XL) 25 MG 24 hr tablet Take 1 tablet (25 mg total) by mouth daily.  . primidone (MYSOLINE) 50 MG tablet TAKE ONE TABLET AT BEDTIME  . sildenafil (VIAGRA) 100 MG tablet Take 100 mg by mouth as needed.  . simvastatin (ZOCOR) 40 MG tablet TAKE ONE TABLET AT BEDTIME   Current Facility-Administered Medications for the 03/28/21 encounter (Office Visit) with [name], MD  Medication  . 0.9 %  sodium chloride infusion    Allergies:   Codeine   Social History   Socioeconomic History  . Marital status: Married    Spouse name: Not on file  . Number of children: 2  . Years of education: 16  . Highest education level: Bachelor's degree (e.g., BA, AB, BS)  Occupational History  . Occupation: retired  Tobacco Use  . Smoking status: Former Smoker    Types: Cigars  . Smokeless tobacco: Never Used  . Tobacco comment: as a teenager  Vaping Use  . Vaping Use: Never used  Substance and Sexual Activity  . Alcohol use: Yes    Alcohol/week: 0.0 standard drinks    Comment: 5-6 ounces of [place] wine or beer 5-6 x/week   . Drug use: No  . Sexual activity: Yes  Other Topics Concern  . Not on file  Social History Narrative   Widowed 2013 (after wife's long battle with  MS).  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  Children live in [place], [place] and [place].   Married March 2016 [name])   1 dog.   Social Determinants of Health   Financial Resource Strain: Not on file  Food Insecurity: Not on file  Transportation Needs: Not on file  Physical Activity: Not on file  Stress: Not on file  Social Connections: Not on file     Family History:  The patient's family history includes Cancer (age of onset: 28) in his mother; Colon cancer in his mother; Colon polyps in his mother; Diabetes in his mother; Heart disease in his brother, father, and son; Hyperlipidemia in his brother, brother, brother, son, and son; Hypertension in his father; Sleep apnea in his son; Syncope episode in his son; Tremor in his brother and brother.   ROS:   Please see the history of present illness.    ROS All other systems reviewed and are negative.  No flowsheet data found.     PHYSICAL EXAM:   VS:  BP (!) 146/86   Pulse (!) 57   Ht 6' (1.829 m)   Wt 172 lb 12.8 oz (78.4 kg)   SpO2 98%   BMI 23.44 kg/m    GEN: Well nourished, well developed in no acute distress HEENT: Normal NECK: No JVD; No carotid bruits LYMPHATICS: No lymphadenopathy CARDIAC:RRR, no murmurs, rubs, gallops RESPIRATORY:  Clear to auscultation without rales, wheezing or rhonchi  ABDOMEN: Soft, non-tender, non-distended MUSCULOSKELETAL:  No edema; No deformity  SKIN: Warm and dry NEUROLOGIC:  Alert and oriented x 3 PSYCHIATRIC:  Normal affect     Wt Readings from Last 3 Encounters:  03/28/21 172 lb 12.8 oz (78.4 kg)  01/05/21 177 lb 3.2 oz (80.4 kg)  10/27/20 176 lb (79.8 kg)      Studies/Labs Reviewed:   EKG:  EKG is ordered today and showed sinus bradycardia at 57bpm with LAFB and LVH by voltage  Recent Labs: 04/14/2020: BUN 19; Creatinine, Ser 1.01; Potassium 4.3; Sodium 138   Lipid  Panel    Component Value Date/Time   CHOL 149 12/09/2019 0836   TRIG 108 12/09/2019 0836   HDL 47 12/09/2019 0836   CHOLHDL 3.2 12/09/2019 0836   CHOLHDL 2.9 10/30/2017 0944   VLDL 17 08/20/2016 1051   LDLCALC 82 12/09/2019 0836   LDLCALC 80 10/30/2017 0944    Additional studies/ records that were reviewed today include:  2D echo 03/2020 IMPRESSIONS  1. Left ventricular ejection fraction, by estimation, is 60 to 65%. The  left ventricle has normal function. The left ventricle has no regional  wall motion abnormalities. Left ventricular diastolic parameters are  indeterminate.  2. Right ventricular systolic function is normal. The right ventricular  size is normal. There is normal pulmonary artery systolic pressure. The  estimated right ventricular systolic pressure is 89.3 mmHg.  3. The mitral valve is grossly normal. Mild mitral valve regurgitation.  No evidence of mitral stenosis.  4. The aortic valve is tricuspid. Aortic valve regurgitation is not  visualized. Mild aortic valve sclerosis is present, with no evidence of  aortic valve stenosis.  5. The inferior vena cava is normal in size with greater than 50%  respiratory variability, suggesting right atrial pressure of 3 mmHg.   Cardiac Cath 03/2020 Study Highlights    Nuclear stress EF: 56%.  There was no ST segment deviation noted during stress.  No T wave inversion was noted during stress.  The study is normal.  This is a low risk study.  The left ventricular ejection fraction is normal (55-65%).   1. There are reduced counts in the mid inferior segment on rest and stress imaging consistent with diaphragm attenuation.  2. This is a normal study without evidence of ischemia or infarction.  3. Normal LVEF, 56%.  4. This is a low-risk study.    ASSESSMENT:    1. SOB (shortness of breath)   2. Essential hypertension   3. Pure hypercholesterolemia   4. Preoperative clearance      PLAN:  In  order of problems listed above:  1.  Hx of SOB/Chest pain -felt possibly related to poorly controlled HTN and diastolic dysfunction  -CP and SOB have resolved on Toprol and getting BP controlled -he denies any chest pain or SOB since I saw him a year ago -nuclear stress test showed no ischemia in 03/2020 -2D echo showed normal LVF in June 2021  2.  HTN -his BP is borderline controlled on exam today -cannot increase BB due to bradycardia -Increase Losartan to 50mg  daily and check BMET in 1 week -continue prescription drug management with Toprol XL 25mg  daily -BP check with PharmD in 2 weeks  3.  HLD -followed by PCP -LDL goal < 100 -I have personally reviewed and interpreted outside labs performed by patient's PCP which showed LDL 82, HDL 47, TAGs 108 and ALT 21 -continue Simvastatin  4 .  Preoperative clearance -he needs to have a hip replacement done -he is asymptomatic from a cardiac standpoint -He is able to achieve 6.6 mets of activity with no cardiac symptoms and therefore does not need any further ischemic workup prior to surgery -He has a 0.4% risk of major cardiac event with hip surgery based on the RCRI  Medication Adjustments/Labs and Tests Ordered: Current medicines are reviewed at length with the patient today.  Concerns regarding medicines are outlined above.  Medication changes, Labs and Tests ordered today are listed in the Patient Instructions below.  There are no Patient Instructions on file for this visit.   Signed, [name], MD  03/28/2021 12:28 PM    [facility] [place], [place], [place]  [address] Phone: [phone]; Fax: [phone]

## 2021-03-28 NOTE — Addendum Note (Signed)
Addended by: Antonieta Iba on: 03/28/2021 12:33 PM   Modules accepted: Orders

## 2021-03-29 ENCOUNTER — Ambulatory Visit (INDEPENDENT_AMBULATORY_CARE_PROVIDER_SITE_OTHER): Payer: PPO | Admitting: Family Medicine

## 2021-03-29 ENCOUNTER — Encounter: Payer: Self-pay | Admitting: Family Medicine

## 2021-03-29 VITALS — BP 120/70 | HR 64 | Ht 72.0 in | Wt 174.2 lb

## 2021-03-29 DIAGNOSIS — R351 Nocturia: Secondary | ICD-10-CM

## 2021-03-29 DIAGNOSIS — G3184 Mild cognitive impairment, so stated: Secondary | ICD-10-CM

## 2021-03-29 DIAGNOSIS — M1612 Unilateral primary osteoarthritis, left hip: Secondary | ICD-10-CM | POA: Diagnosis not present

## 2021-03-29 DIAGNOSIS — R7301 Impaired fasting glucose: Secondary | ICD-10-CM | POA: Diagnosis not present

## 2021-03-29 DIAGNOSIS — Z5181 Encounter for therapeutic drug level monitoring: Secondary | ICD-10-CM

## 2021-03-29 DIAGNOSIS — I10 Essential (primary) hypertension: Secondary | ICD-10-CM

## 2021-03-29 DIAGNOSIS — Z23 Encounter for immunization: Secondary | ICD-10-CM | POA: Diagnosis not present

## 2021-03-29 DIAGNOSIS — N529 Male erectile dysfunction, unspecified: Secondary | ICD-10-CM

## 2021-03-29 DIAGNOSIS — G250 Essential tremor: Secondary | ICD-10-CM

## 2021-03-29 DIAGNOSIS — Z0000 Encounter for general adult medical examination without abnormal findings: Secondary | ICD-10-CM | POA: Diagnosis not present

## 2021-03-29 DIAGNOSIS — E7800 Pure hypercholesterolemia, unspecified: Secondary | ICD-10-CM

## 2021-03-29 DIAGNOSIS — N401 Enlarged prostate with lower urinary tract symptoms: Secondary | ICD-10-CM | POA: Diagnosis not present

## 2021-03-29 DIAGNOSIS — R0602 Shortness of breath: Secondary | ICD-10-CM

## 2021-03-29 LAB — POCT URINALYSIS DIP (PROADVANTAGE DEVICE)
Bilirubin, UA: NEGATIVE
Glucose, UA: NEGATIVE mg/dL
Ketones, POC UA: NEGATIVE mg/dL
Leukocytes, UA: NEGATIVE
Nitrite, UA: NEGATIVE
Protein Ur, POC: NEGATIVE mg/dL
Specific Gravity, Urine: 1.015
Urobilinogen, Ur: NEGATIVE
pH, UA: 6.5 (ref 5.0–8.0)

## 2021-03-30 ENCOUNTER — Telehealth: Payer: Self-pay | Admitting: Cardiology

## 2021-03-30 ENCOUNTER — Encounter: Payer: Self-pay | Admitting: Family Medicine

## 2021-03-30 DIAGNOSIS — R0602 Shortness of breath: Secondary | ICD-10-CM

## 2021-03-30 DIAGNOSIS — I10 Essential (primary) hypertension: Secondary | ICD-10-CM

## 2021-03-30 LAB — COMPREHENSIVE METABOLIC PANEL
ALT: 18 IU/L (ref 0–44)
AST: 24 IU/L (ref 0–40)
Albumin/Globulin Ratio: 2.9 — ABNORMAL HIGH (ref 1.2–2.2)
Albumin: 4.7 g/dL — ABNORMAL HIGH (ref 3.6–4.6)
Alkaline Phosphatase: 91 IU/L (ref 44–121)
BUN/Creatinine Ratio: 15 (ref 10–24)
BUN: 15 mg/dL (ref 8–27)
Bilirubin Total: 0.6 mg/dL (ref 0.0–1.2)
CO2: 26 mmol/L (ref 20–29)
Calcium: 9.8 mg/dL (ref 8.6–10.2)
Chloride: 101 mmol/L (ref 96–106)
Creatinine, Ser: 1.03 mg/dL (ref 0.76–1.27)
Globulin, Total: 1.6 g/dL (ref 1.5–4.5)
Glucose: 110 mg/dL — ABNORMAL HIGH (ref 65–99)
Potassium: 5.1 mmol/L (ref 3.5–5.2)
Sodium: 139 mmol/L (ref 134–144)
Total Protein: 6.3 g/dL (ref 6.0–8.5)
eGFR: 73 mL/min/{1.73_m2} (ref >59)

## 2021-03-30 LAB — CBC WITH DIFFERENTIAL/PLATELET
Basophils Absolute: 0.1 10*3/uL (ref 0.0–0.2)
Basos: 1 %
EOS (ABSOLUTE): 0.2 10*3/uL (ref 0.0–0.4)
Eos: 3 %
Hematocrit: 41.9 % (ref 37.5–51.0)
Hemoglobin: 14.1 g/dL (ref 13.0–17.7)
Immature Grans (Abs): 0 10*3/uL (ref 0.0–0.1)
Immature Granulocytes: 0 %
Lymphocytes Absolute: 1.6 10*3/uL (ref 0.7–3.1)
Lymphs: 26 %
MCH: 30.1 pg (ref 26.6–33.0)
MCHC: 33.7 g/dL (ref 31.5–35.7)
MCV: 90 fL (ref 79–97)
Monocytes Absolute: 0.7 10*3/uL (ref 0.1–0.9)
Monocytes: 12 %
Neutrophils Absolute: 3.6 10*3/uL (ref 1.4–7.0)
Neutrophils: 58 %
Platelets: 242 10*3/uL (ref 150–450)
RBC: 4.68 x10E6/uL (ref 4.14–5.80)
RDW: 12.2 % (ref 11.6–15.4)
WBC: 6.2 10*3/uL (ref 3.4–10.8)

## 2021-03-30 LAB — HEMOGLOBIN A1C
Est. average glucose Bld gHb Est-mCnc: 126 mg/dL
Hgb A1c MFr Bld: 6 % — ABNORMAL HIGH (ref 4.8–5.6)

## 2021-03-30 LAB — LIPID PANEL
Chol/HDL Ratio: 3 ratio (ref 0.0–5.0)
Cholesterol, Total: 144 mg/dL (ref 100–199)
HDL: 48 mg/dL (ref >39)
LDL Chol Calc (NIH): 77 mg/dL (ref 0–99)
Triglycerides: 101 mg/dL (ref 0–149)
VLDL Cholesterol Cal: 19 mg/dL (ref 5–40)

## 2021-03-30 MED ORDER — SIMVASTATIN 40 MG PO TABS: 40.0000 mg | ORAL_TABLET | Freq: Every day | ORAL | 3 refills | Status: DC

## 2021-03-30 NOTE — Telephone Encounter (Signed)
Order replaced

## 2021-03-30 NOTE — Telephone Encounter (Signed)
BMET lab  order needs to be re-entered back in the system because the order was released in error per [facility] [place]

## 2021-03-30 NOTE — Progress Notes (Signed)
Done and advised Lesly that needs to be done [facility]

## 2021-04-03 DIAGNOSIS — M25552 Pain in left hip: Secondary | ICD-10-CM | POA: Diagnosis not present

## 2021-04-04 ENCOUNTER — Other Ambulatory Visit: Payer: PPO

## 2021-04-04 ENCOUNTER — Other Ambulatory Visit: Payer: Self-pay

## 2021-04-04 DIAGNOSIS — I10 Essential (primary) hypertension: Secondary | ICD-10-CM | POA: Diagnosis not present

## 2021-04-04 DIAGNOSIS — R0602 Shortness of breath: Secondary | ICD-10-CM

## 2021-04-04 LAB — BASIC METABOLIC PANEL
BUN/Creatinine Ratio: 14 (ref 10–24)
BUN: 17 mg/dL (ref 8–27)
CO2: 24 mmol/L (ref 20–29)
Calcium: 9.6 mg/dL (ref 8.6–10.2)
Chloride: 101 mmol/L (ref 96–106)
Creatinine, Ser: 1.2 mg/dL (ref 0.76–1.27)
Glucose: 84 mg/dL (ref 65–99)
Potassium: 5 mmol/L (ref 3.5–5.2)
Sodium: 137 mmol/L (ref 134–144)
eGFR: 61 mL/min/{1.73_m2} (ref >59)

## 2021-04-06 DIAGNOSIS — H5203 Hypermetropia, bilateral: Secondary | ICD-10-CM | POA: Diagnosis not present

## 2021-04-06 DIAGNOSIS — D3132 Benign neoplasm of left choroid: Secondary | ICD-10-CM | POA: Diagnosis not present

## 2021-04-06 DIAGNOSIS — H2513 Age-related nuclear cataract, bilateral: Secondary | ICD-10-CM | POA: Diagnosis not present

## 2021-04-07 ENCOUNTER — Other Ambulatory Visit: Payer: Self-pay | Admitting: Cardiology

## 2021-04-07 NOTE — Patient Instructions (Signed)
DUE TO COVID-19 ONLY ONE VISITOR IS ALLOWED TO COME WITH YOU AND STAY IN THE WAITING ROOM ONLY DURING PRE OP AND PROCEDURE.   **NO VISITORS ARE ALLOWED IN THE SHORT STAY AREA OR RECOVERY ROOM!!**       Your procedure is scheduled on: 04/18/21   Report to [facility] Main  Entrance   Report to Short Stay at 5:15 AM   [facility])   Call this number if you have problems the morning of surgery [phone]   Do not eat food :After Midnight.   May have liquids until 4:15 AM day of surgery  CLEAR LIQUID DIET  Foods Allowed                                                                     Foods Excluded  Water, Black Coffee and tea, regular and decaf               liquids that you cannot  Plain Jell-O in any flavor  (No red)                                     see through such as: Fruit ices (not with fruit pulp)                                             milk, soups, orange juice              Iced Popsicles (No red)                                                 All solid food                                   Apple juices Sports drinks like Gatorade (No red) Lightly seasoned clear broth or consume(fat free) Sugar, honey syrup     Complete one G2 drink the morning of surgery at 4:15 AM  the day of surgery.     The day of surgery:  Drink ONE (1) G2 by 4:15 AM the morning of surgery. Drink in one sitting. Do not sip.  This drink was given to you during your hospital  pre-op appointment visit. Nothing else to drink after completing the  Pre-Surgery Clear Ensure or G2.          If you have questions, please contact your surgeon's office.     Oral Hygiene is also important to reduce your risk of infection.                                    Remember - BRUSH YOUR TEETH THE MORNING OF SURGERY WITH YOUR REGULAR TOOTHPASTE   Do NOT smoke after Midnight   Take these medicines  the morning of surgery with A SIP OF WATER: Finasteride, Metoprolol.                                You may not have any metal on your body including jewelry, and body piercing             Do not wear lotions, powders, cologne, or deodorant              Men may shave face and neck.   Do not bring valuables to the hospital. [place].   Contacts, dentures or bridgework may not be worn into surgery.    Patients discharged the day of surgery will not be allowed to drive home.  Special Instructions: Bring a copy of your healthcare power of attorney and living will documents         the day of surgery if you haven't scanned them in before.              Please read over the following fact sheets you were given: IF YOU HAVE QUESTIONS ABOUT YOUR PRE OP INSTRUCTIONS PLEASE CALL [phone]   [place] - Preparing for Surgery Before surgery, you can play an important role.  Because skin is not sterile, your skin needs to be as free of germs as possible.  You can reduce the number of germs on your skin by washing with CHG (chlorahexidine gluconate) soap before surgery.  CHG is an antiseptic cleaner which kills germs and bonds with the skin to continue killing germs even after washing. Please DO NOT use if you have an allergy to CHG or antibacterial soaps.  If your skin becomes reddened/irritated stop using the CHG and inform your nurse when you arrive at Short Stay. Do not shave (including legs and underarms) for at least 48 hours prior to the first CHG shower.  You may shave your face/neck.  Please follow these instructions carefully:  1.  Shower with CHG Soap the night before surgery and the  morning of surgery.  2.  If you choose to wash your hair, wash your hair first as usual with your normal  shampoo.  3.  After you shampoo, rinse your hair and body thoroughly to remove the shampoo.                             4.  Use CHG as you would any other liquid soap.  You can apply chg directly to the skin and wash.  Gently with a scrungie or  clean washcloth.  5.  Apply the CHG Soap to your body ONLY FROM THE NECK DOWN.   Do   not use on face/ open                           Wound or open sores. Avoid contact with eyes, ears mouth and   genitals (private parts).                       Wash face,  Genitals (private parts) with your normal soap.             6.  Wash thoroughly, paying special attention to the area where your  surgery  will be performed.  7.  Thoroughly rinse your body with warm water from the neck down.  8.  DO NOT shower/wash with your normal soap after using and rinsing off the CHG Soap.                9.  Pat yourself dry with a clean towel.            10.  Wear clean pajamas.            11.  Place clean sheets on your bed the night of your first shower and do not  sleep with pets. Day of Surgery : Do not apply any lotions/deodorants the morning of surgery.  Please wear clean clothes to the hospital/surgery center.  FAILURE TO FOLLOW THESE INSTRUCTIONS MAY RESULT IN THE CANCELLATION OF YOUR SURGERY  PATIENT SIGNATURE_________________________________  NURSE SIGNATURE__________________________________  ________________________________________________________________________   Adam Phenix  An incentive spirometer is a tool that can help keep your lungs clear and active. This tool measures how well you are filling your lungs with each breath. Taking long deep breaths may help reverse or decrease the chance of developing breathing (pulmonary) problems (especially infection) following: A long period of time when you are unable to move or be active. BEFORE THE PROCEDURE  If the spirometer includes an indicator to show your best effort, your nurse or respiratory therapist will set it to a desired goal. If possible, sit up straight or lean slightly forward. Try not to slouch. Hold the incentive spirometer in an upright position. INSTRUCTIONS FOR USE  Sit on the edge of your bed if possible, or sit up as far  as you can in bed or on a chair. Hold the incentive spirometer in an upright position. Breathe out normally. Place the mouthpiece in your mouth and seal your lips tightly around it. Breathe in slowly and as deeply as possible, raising the piston or the ball toward the top of the column. Hold your breath for 3-5 seconds or for as long as possible. Allow the piston or ball to fall to the bottom of the column. Remove the mouthpiece from your mouth and breathe out normally. Rest for a few seconds and repeat Steps 1 through 7 at least 10 times every 1-2 hours when you are awake. Take your time and take a few normal breaths between deep breaths. The spirometer may include an indicator to show your best effort. Use the indicator as a goal to work toward during each repetition. After each set of 10 deep breaths, practice coughing to be sure your lungs are clear. If you have an incision (the cut made at the time of surgery), support your incision when coughing by placing a pillow or rolled up towels firmly against it. Once you are able to get out of bed, walk around indoors and cough well. You may stop using the incentive spirometer when instructed by your caregiver.  RISKS AND COMPLICATIONS Take your time so you do not get dizzy or light-headed. If you are in pain, you may need to take or ask for pain medication before doing incentive spirometry. It is harder to take a deep breath if you are having pain. AFTER USE Rest and breathe slowly and easily. It can be helpful to keep track of a log of your progress. Your caregiver can provide you with a simple table to help with this. If you are using the spirometer at home, follow these instructions: [place] IF:  You are having difficultly using the spirometer. You have trouble using the spirometer as often as instructed. Your pain medication is not giving enough relief while using the spirometer. You develop fever of 100.5 F (38.1 C) or  higher. SEEK IMMEDIATE MEDICAL CARE IF:  You cough up bloody sputum that had not been present before. You develop fever of 102 F (38.9 C) or greater. You develop worsening pain at or near the incision site. MAKE SURE YOU:  Understand these instructions. Will watch your condition. Will get help right away if you are not doing well or get worse. Document Released: 02/25/2007 Document Revised: 01/07/2012 Document Reviewed: 04/28/2007 [facility] [address] Patient Information 2014 [place], [place].   ________________________________________________________________________

## 2021-04-07 NOTE — Progress Notes (Addendum)
COVID Vaccine Completed: Yes x4 Date COVID Vaccine completed: 11/18/19, 12/07/19, 08/02/20, 03/29/21 Has received booster: yes COVID vaccine manufacturer: [place]      Date of COVID positive in last 90 days: N/A  PCP - [name], MD Cardiologist - [name], MD  Chest x-ray - 04/11/21 Epic EKG - 03/28/21 Epic Stress Test - 04/11/20 Epic ECHO - 04/25/20- Epic Cardiac Cath - N/A Pacemaker/ICD device last checked: Spinal Cord Stimulator:  Cardiac Clearance in cardiology note by [name] dated 03/28/21  Sleep Study - N/A CPAP -   Fasting Blood Sugar - N/A Checks Blood Sugar _____ times a day  Blood Thinner Instructions: N/A Aspirin Instructions: Last Dose:  Activity level:  Can go up a flight of stairs and perform activities of daily living without stopping and without symptoms of chest pain. Pt endorses exertional SOB, especially when outdoors with warm weather.     Anesthesia review: CP, SOB, HTN,   Patient denies shortness of breath, fever, cough and chest pain at PAT appointment   Patient verbalized understanding of instructions that were given to them at the PAT appointment. Patient was also instructed that they will need to review over the PAT instructions again at home before surgery.

## 2021-04-10 NOTE — Care Plan (Signed)
Ortho Bundle Case Management Note  Patient Details  Name: Eric Simmons. MRN: [id number] Date of Birth: [birth year]     Spoke with patient prior to surgery. He will discharge to home with family to assist. Has equipment at home. OPPT set up with  [facility] [address]. Patient and MD in agreement with plan. Choice offered                 DME Arranged:    DME Agency:     HH Arranged:    HH Agency:     Additional Comments: Please contact me with any questions of if this plan should need to change.  Ladell Heads,  [place] Specialist  [phone] 04/10/2021, 4:44 PM

## 2021-04-11 ENCOUNTER — Encounter (HOSPITAL_COMMUNITY)
Admission: RE | Admit: 2021-04-11 | Discharge: 2021-04-11 | Disposition: A | Discharge status: Home / Self Care | Payer: PPO | Source: Ambulatory Visit | Attending: Orthopaedic Surgery | Admitting: Orthopaedic Surgery

## 2021-04-11 ENCOUNTER — Telehealth: Payer: Self-pay | Admitting: Cardiology

## 2021-04-11 ENCOUNTER — Ambulatory Visit (HOSPITAL_COMMUNITY)
Admission: RE | Admit: 2021-04-11 | Discharge: 2021-04-11 | Disposition: A | Discharge status: Home / Self Care | Payer: PPO | Source: Ambulatory Visit | Attending: Orthopaedic Surgery | Admitting: Orthopaedic Surgery

## 2021-04-11 ENCOUNTER — Encounter (HOSPITAL_COMMUNITY): Payer: Self-pay

## 2021-04-11 ENCOUNTER — Other Ambulatory Visit: Payer: Self-pay

## 2021-04-11 DIAGNOSIS — M47814 Spondylosis without myelopathy or radiculopathy, thoracic region: Secondary | ICD-10-CM | POA: Diagnosis not present

## 2021-04-11 DIAGNOSIS — Z01818 Encounter for other preprocedural examination: Secondary | ICD-10-CM

## 2021-04-11 DIAGNOSIS — R918 Other nonspecific abnormal finding of lung field: Secondary | ICD-10-CM | POA: Diagnosis not present

## 2021-04-11 LAB — PROTIME-INR
INR: 1 (ref 0.8–1.2)
Prothrombin Time: 13 seconds (ref 11.4–15.2)

## 2021-04-11 LAB — SURGICAL PCR SCREEN
MRSA, PCR: NEGATIVE
Staphylococcus aureus: NEGATIVE

## 2021-04-11 LAB — APTT: aPTT: 29 seconds (ref 24–36)

## 2021-04-11 NOTE — Telephone Encounter (Signed)
Patient is calling in to see if the appt on Friday is actually needed. He stated the adjustment that Dr. Radford Pax made to his medication he states its doing fine. Please advise

## 2021-04-11 NOTE — Telephone Encounter (Signed)
Spoke with the patient who states that he had a bunch of pre-procedure testing done today and he would like to know why he has to come back here on Friday for an appointment. I advised him that he was scheduled to see the pharmacist in the HTN clinic to help with managing his blood pressure. Patient states that he has been monitoring it on his own and he has felt good since Dr. Radford Pax increased his losartan to 50 mg daily . Patient states in the AM his blood pressure runs around 145-150/80s and in the evenings its 130s/80s. Today it was 148/89 at pre-op visit.  Advised patient that I recommend for him to keep his appointment with PharmD. Patient says that he will think about it and call back tomorrow.

## 2021-04-13 NOTE — Progress Notes (Signed)
Patient ID: Miachel Simmons.                 DOB: October 15, 1941                      MRN: [id number]     HPI: Eric Simmons. is a pleasant 80 y.o. male referred by Dr. Radford Pax to HTN clinic. PMH is significant for hyperlipidemia, hypertension, and DOE/chest discomfort with exertion which have resolved since controlling BP. Nuclear stress test 03/2020 showed no ischemia and normal LVEF. Last seen by Dr Radford Pax on 03/28/21 where BP was elevated at 146/86 and losartan was increased to 50mg  daily. F/u BMET 1 week later was stable, K 5 and SCr 1.2.  Pt presents today in good spirits. Denies dizziness, balance issues, headaches, and blurred vision. Checks BP at home each day. 120-150 SBP at night, higher during the day, highest SBP up to 180. Uses bicep cuff he's had for 3-4 months, calibrated well at his PCP office. Does stand when he checks his BP. Avoids NSAIDs, uses Tylenol regularly. Had BLT at [facility] today for lunch.  Current HTN meds:  Losartan 50mg  daily - 10pm Toprol 25mg  daily - 7am  BP goal: <130/39mmHg  Family History: Father died at 12 from massive MI. Cancer (age of onset: 32) in his mother; Colon cancer in his mother; Colon polyps in his mother; Diabetes in his mother; Heart disease in his brother, father, and son; Hyperlipidemia in his brother, brother, brother, son, and son; Hypertension in his father; Sleep apnea in his son; Syncope episode in his son; Tremor in his brother and brother  Social History: Tobacco use as a teenager, 5-6oz Javed wine or beer 5-6x/week  Diet:  Breakfast - Raisin Bran cereal, small donut, small glass of OJ with AM meds, decaf coffee Lunch - ham and cheese sandwich, fruit, cookie, caffeine free Diet Coke Dinner - ham, tuna, chicken, occasionally beef; always has a vegetable, sometimes potatoes; iced tea; 1 glass [place] wine or beer Eats out about once a week. Doesn't add salt to food  [place] [facility] [place] 1[place]5 + miles   Wt  Readings from Last 3 Encounters:  04/11/21 173 lb 3.2 oz (78.6 kg)  03/29/21 174 lb 3.2 oz (79 kg)  03/28/21 172 lb 12.8 oz (78.4 kg)   BP Readings from Last 3 Encounters:  04/11/21 (!) 148/89  03/29/21 120/70  03/28/21 (!) 146/86   Pulse Readings from Last 3 Encounters:  04/11/21 (!) 54  03/29/21 64  03/28/21 (!) 57    Renal function: Estimated Creatinine Clearance: 53 mL/min (by C-G formula based on SCr of 1.2 mg/dL).  Past Medical History:  Diagnosis Date   Adenomatous colon polyp 03/2005   tubular adenoma   BCC (basal cell carcinoma), face    left face, R ear (04/2014), nose 05/2016   Benign familial tremor 08/29/2012   Erectile dysfunction    Hemorrhoids    internal and external   Hip arthritis    left   IFG (impaired fasting glucose) 12/10/2012   Glucose 111 11/2012    Impaired fasting glucose    since his 20's; had yearly GTT's for 5 yrs, never progressed   Insomnia    Mild neurocognitive disorder 09/11/2019   Pseudogout of knee 07/2003   left   Pure hypercholesterolemia     Current Outpatient Medications on File Prior to Visit  Medication Sig Dispense Refill  acetaminophen (TYLENOL) 650 MG CR tablet Take 650 mg by mouth every 8 (eight) hours as needed for pain.     cholecalciferol (VITAMIN D) 1000 units tablet Take 1,000 Units by mouth daily.     Co-Enzyme Q-10 100 MG CAPS Take 100 mg by mouth daily.     Cyanocobalamin (VITAMIN B 12 PO) Take 1,000 mcg by mouth daily.     finasteride (PROSCAR) 5 MG tablet Take 5 mg by mouth daily.     Glucosamine-Chondroit-Vit C-Mn (GLUCOSAMINE 1500 COMPLEX) CAPS Take 1 capsule by mouth daily.     losartan (COZAAR) 50 MG tablet Take 1 tablet (50 mg total) by mouth daily. 90 tablet 3   metoprolol succinate (TOPROL-XL) 25 MG 24 hr tablet TAKE ONE TABLET DAILY 90 tablet 3   primidone (MYSOLINE) 50 MG tablet TAKE ONE TABLET AT BEDTIME (Patient taking differently: Take 50 mg by mouth at bedtime.) 90 tablet 2   sildenafil (VIAGRA)  100 MG tablet Take 100 mg by mouth daily as needed for erectile dysfunction.     simvastatin (ZOCOR) 40 MG tablet Take 1 tablet (40 mg total) by mouth at bedtime. 90 tablet 3   Current Facility-Administered Medications on File Prior to Visit  Medication Dose Route Frequency Provider Last Rate Last Admin   0.9 %  sodium chloride infusion  500 mL Intravenous Once Gatha Mayer, MD        Allergies  Allergen Reactions   Codeine     REACTION: violent vomiting     Assessment/Plan:  1. Hypertension - BP elevated above goal <130/35mmHg in clinic and with most home readings. Unable to increase Toprol due to resting HR in the 60s, unable to increase losartan due to K of 5. Will start amlodipine 5mg  daily (AM) and continue losartan 50mg  daily (PM) and Toprol 25mg  daily. Encouraged pt to continue checking BP at home but to sit while checking his readings. He'll continue with low sodium/caffeine diet and daily exercise. Will call pt in 3 weeks to follow up with home BP readings. Home cuff has calibrated well at his PCP office.  2. Hyperlipidemia - Pt currently taking simvastatin 40mg  daily. LDL well controlled at 77 for primary prevention, however simvastatin interacts with amlodipine. Will stop simvastatin and start rosuvastatin 20mg  daily.   Aliahna Statzer E. Frady Taddeo, PharmD, BCACP, [place] [address], [place], [place] [address] Phone: [phone]; Fax: [phone] 04/14/2021 3:48 PM

## 2021-04-13 NOTE — Anesthesia Preprocedure Evaluation (Addendum)
Anesthesia Evaluation  Patient identified by MRN, date of birth, ID band Patient awake    Reviewed: Allergy & Precautions, NPO status , Patient's Chart, lab work & pertinent test results  Airway Mallampati: II  TM Distance: >3 FB Neck ROM: Full    Dental  (+) Teeth Intact   Pulmonary neg pulmonary ROS, former smoker,    Pulmonary exam normal        Cardiovascular hypertension, Pt. on medications and Pt. on home beta blockers  Rhythm:Regular Rate:Normal     Neuro/Psych negative neurological ROS  negative psych ROS   GI/Hepatic negative GI ROS, Neg liver ROS,   Endo/Other  negative endocrine ROS  Renal/GU negative Renal ROS  negative genitourinary   Musculoskeletal  (+) Arthritis , Osteoarthritis,    Abdominal (+)  Abdomen: soft.    Peds  Hematology negative hematology ROS (+)   Anesthesia Other Findings   Reproductive/Obstetrics                           Anesthesia Physical Anesthesia Plan  ASA: 2  Anesthesia Plan: MAC and Spinal   Post-op Pain Management:    Induction: Intravenous  PONV Risk Score and Plan: 1 and Ondansetron, Propofol infusion, Dexamethasone and Treatment may vary due to age or medical condition  Airway Management Planned: Simple Face Mask, Natural Airway and Nasal Cannula  Additional Equipment: None  Intra-op Plan:   Post-operative Plan:   Informed Consent: I have reviewed the patients History and Physical, chart, labs and discussed the procedure including the risks, benefits and alternatives for the proposed anesthesia with the patient or authorized representative who has indicated his/her understanding and acceptance.     Dental advisory given  Plan Discussed with:   Anesthesia Plan Comments: (Per cardiology note 03/28/2021, "Preoperative clearance -he needs to have a hip replacement done -he is asymptomatic from a cardiac standpoint -He is able to  achieve 6.6 mets of activity with no cardiac symptoms and therefore does not need any further ischemic workup prior to surgery -He has a 0.4% risk of major cardiac event with hip surgery based on the RCRI")      Anesthesia Quick Evaluation

## 2021-04-14 ENCOUNTER — Ambulatory Visit (INDEPENDENT_AMBULATORY_CARE_PROVIDER_SITE_OTHER): Payer: PPO | Admitting: Pharmacist

## 2021-04-14 ENCOUNTER — Other Ambulatory Visit: Payer: Self-pay

## 2021-04-14 VITALS — BP 144/72 | HR 68

## 2021-04-14 DIAGNOSIS — I10 Essential (primary) hypertension: Secondary | ICD-10-CM | POA: Diagnosis not present

## 2021-04-14 MED ORDER — AMLODIPINE BESYLATE 5 MG PO TABS: 5.0000 mg | ORAL_TABLET | Freq: Every day | ORAL | 3 refills | Status: DC

## 2021-04-14 MED ORDER — ROSUVASTATIN CALCIUM 20 MG PO TABS: 20.0000 mg | ORAL_TABLET | Freq: Every day | ORAL | 3 refills | Status: DC

## 2021-04-14 NOTE — Patient Instructions (Addendum)
It was nice to meet you today!  Your blood pressure goal is <130/72mmHg  START taking amlodipine 5mg  - 1 tablet once daily in the morning. You can cut your tablets in half and take 2.5mg  daily until your surgery, then increase to the full tablet afterwards  STOP taking simvastatin and START taking rosuvastatin 20mg  once daily for your cholesterol  Continue taking your other medications   Continue to monitor your blood pressure at home while sitting down, and continue to record your readings  I'll call you in ~3 weeks to see how your blood pressure is doing. You can give me a call with any concerns before then at [phone]

## 2021-04-17 DIAGNOSIS — M25552 Pain in left hip: Secondary | ICD-10-CM | POA: Diagnosis not present

## 2021-04-17 MED ORDER — TRANEXAMIC ACID 1000 MG/10ML IV SOLN
2000.0000 mg | INTRAVENOUS | Status: DC
  Filled 2021-04-17: qty 20

## 2021-04-17 MED ORDER — BUPIVACAINE LIPOSOME 1.3 % IJ SUSP
10.0000 mL | Freq: Once | INTRAMUSCULAR | Status: DC
  Filled 2021-04-17: qty 10

## 2021-04-17 NOTE — H&P (Signed)
TOTAL HIP ADMISSION H&P  Patient is admitted for left total hip arthroplasty.  Subjective:  Chief Complaint: left hip pain  HPI: Eric Simmons., 80 y.o. male, has a history of pain and functional disability in the left hip(s) due to arthritis and patient has failed non-surgical conservative treatments for greater than 12 weeks to include NSAID's and/or analgesics, corticosteriod injections, flexibility and strengthening excercises, use of assistive devices, weight reduction as appropriate, and activity modification.  Onset of symptoms was gradual starting 5 years ago with gradually worsening course since that time.The patient noted no past surgery on the left hip(s).  Patient currently rates pain in the left hip at 10 out of 10 with activity. Patient has night pain, worsening of pain with activity and weight bearing, trendelenberg gait, pain that interfers with activities of daily living, and crepitus. Patient has evidence of subchondral cysts, subchondral sclerosis, periarticular osteophytes, and joint space narrowing by imaging studies. This condition presents safety issues increasing the risk of falls. There is no current active infection.  Patient Active Problem List   Diagnosis Date Noted   Mild neurocognitive disorder 09/11/2019   Dysphagia 12/28/2015   Erectile dysfunction 12/07/2013   Insomnia 12/15/2012   IFG (impaired fasting glucose) 12/10/2012   Benign familial tremor 08/29/2012   Pure hypercholesterolemia 12/13/2011   Past Medical History:  Diagnosis Date   Adenomatous colon polyp 03/2005   tubular adenoma   BCC (basal cell carcinoma), face    left face, R ear (04/2014), nose 05/2016   Benign familial tremor 08/29/2012   Erectile dysfunction    Hemorrhoids    internal and external   Hip arthritis    left   IFG (impaired fasting glucose) 12/10/2012   Glucose 111 11/2012    Impaired fasting glucose    since his 20's; had yearly GTT's for 5 yrs, never progressed    Insomnia    Mild neurocognitive disorder 09/11/2019   Pseudogout of knee 07/2003   left   Pure hypercholesterolemia     Past Surgical History:  Procedure Laterality Date   COLONOSCOPY  6/06, 05/2010   Dr. [name]   MOHS SURGERY  6/07   L face, Dr. Link Snuffer   POLYPECTOMY     TONSILLECTOMY     VASECTOMY      Current Facility-Administered Medications  Medication Dose Route Frequency Provider Last Rate Last Admin   0.9 %  sodium chloride infusion  500 mL Intravenous Once Gatha Mayer, MD       [START ON 04/18/2021] bupivacaine liposome (EXPAREL) 1.3 % injection 133 mg  10 mL Other Once Eudelia Bunch, [facility]       [START ON 04/18/2021] tranexamic acid (CYKLOKAPRON) 2,000 mg in sodium chloride 0.9 % 50 mL Topical Application  4,401 mg Topical To OR Eudelia Bunch, RPH       Current Outpatient Medications  Medication Sig Dispense Refill Last Dose   acetaminophen (TYLENOL) 650 MG CR tablet Take 650 mg by mouth every 8 (eight) hours as needed for pain.      cholecalciferol (VITAMIN D) 1000 units tablet Take 1,000 Units by mouth daily.      Co-Enzyme Q-10 100 MG CAPS Take 100 mg by mouth daily.      Cyanocobalamin (VITAMIN B 12 PO) Take 1,000 mcg by mouth daily.      finasteride (PROSCAR) 5 MG tablet Take 5 mg by mouth daily.      Glucosamine-Chondroit-Vit C-Mn (GLUCOSAMINE 1500 COMPLEX) CAPS Take 1 capsule  by mouth daily.      losartan (COZAAR) 50 MG tablet Take 1 tablet (50 mg total) by mouth daily. 90 tablet 3    primidone (MYSOLINE) 50 MG tablet TAKE ONE TABLET AT BEDTIME (Patient taking differently: Take 50 mg by mouth at bedtime.) 90 tablet 2    sildenafil (VIAGRA) 100 MG tablet Take 100 mg by mouth daily as needed for erectile dysfunction.      amLODipine (NORVASC) 5 MG tablet Take 1 tablet (5 mg total) by mouth daily. 30 tablet 3    metoprolol succinate (TOPROL-XL) 25 MG 24 hr tablet TAKE ONE TABLET DAILY 90 tablet 3    rosuvastatin (CRESTOR) 20 MG tablet Take 1 tablet (20 mg  total) by mouth daily. 90 tablet 3    Allergies  Allergen Reactions   Codeine     REACTION: violent vomiting    Social History   Tobacco Use   Smoking status: Former    Pack years: 0.00    Types: Cigars   Smokeless tobacco: Never   Tobacco comments:    as a teenager  Substance Use Topics   Alcohol use: Yes    Alcohol/week: 0.0 standard drinks    Comment: 5-6 ounces of [place] wine or beer 6-7 x/week     Family History  Problem Relation Age of Onset   Cancer Mother 8       colon cancer   Diabetes Mother    Colon cancer Mother    Colon polyps Mother    Heart disease Father        MI   Hypertension Father    Hyperlipidemia Brother    Heart disease Brother        septal defect   Tremor Brother    Hyperlipidemia Brother    Tremor Brother    Hyperlipidemia Brother    Hyperlipidemia Son    Heart disease Son        pacemaker (had syncope, early 2022)   Syncope episode Son        r/b pacemaker   Hyperlipidemia Son    Sleep apnea Son    Esophageal cancer Neg Hx    Stomach cancer Neg Hx    Rectal cancer Neg Hx      Review of Systems  Musculoskeletal:  Positive for arthralgias.       Left hip  All other systems reviewed and are negative.  Objective:  Physical Exam Constitutional:      Appearance: Normal appearance.  HENT:     Head: Normocephalic and atraumatic.     Nose: Nose normal.     Mouth/Throat:     Pharynx: Oropharynx is clear.  Eyes:     Extraocular Movements: Extraocular movements intact.  Abdominal:     Palpations: Abdomen is soft.  Musculoskeletal:     Cervical back: Normal range of motion.     Comments: Left hip motion remains limited and causes some pain with internal rotation.  Leg lengths are roughly equal.  He walks with a mildly altered gait.  Sensation and motor function are intact in his feet with palpable pulses on both sides.   Skin:    General: Skin is warm and dry.  Neurological:     General: No focal deficit present.     Mental  Status: He is alert and oriented to person, place, and time.  Psychiatric:        Mood and Affect: Mood normal.        Behavior: Behavior  normal.        Thought Content: Thought content normal.        Judgment: Judgment normal.    Vital signs in last 24 hours:    Labs:   Estimated body mass index is 23.49 kg/m as calculated from the following:   Height as of 04/11/21: 6' (1.829 m).   Weight as of 04/11/21: 78.6 kg.   Imaging Review Plain radiographs demonstrate severe degenerative joint disease of the left hip(s). The bone quality appears to be good for age and reported activity level.      Assessment/Plan:  End stage primary arthritis, left hip(s)  The patient history, physical examination, clinical judgement of the provider and imaging studies are consistent with end stage degenerative joint disease of the left hip(s) and total hip arthroplasty is deemed medically necessary. The treatment options including medical management, injection therapy, arthroscopy and arthroplasty were discussed at length. The risks and benefits of total hip arthroplasty were presented and reviewed. The risks due to aseptic loosening, infection, stiffness, dislocation/subluxation,  thromboembolic complications and other imponderables were discussed.  The patient acknowledged the explanation, agreed to proceed with the plan and consent was signed. Patient is being admitted for inpatient treatment for surgery, pain control, PT, OT, prophylactic antibiotics, VTE prophylaxis, progressive ambulation and ADL's and discharge planning.The patient is planning to be discharged home with home health services

## 2021-04-18 ENCOUNTER — Ambulatory Visit (HOSPITAL_COMMUNITY): Payer: PPO | Admitting: Anesthesiology

## 2021-04-18 ENCOUNTER — Observation Stay (HOSPITAL_COMMUNITY)
Admission: RE | Admit: 2021-04-18 | Discharge: 2021-04-19 | Disposition: A | Discharge status: Home / Self Care | Payer: PPO | Source: Other Acute Inpatient Hospital | Attending: Orthopaedic Surgery | Admitting: Orthopaedic Surgery

## 2021-04-18 ENCOUNTER — Encounter (HOSPITAL_COMMUNITY): Payer: Self-pay | Admitting: Orthopaedic Surgery

## 2021-04-18 ENCOUNTER — Ambulatory Visit (HOSPITAL_COMMUNITY): Payer: PPO | Admitting: Physician Assistant

## 2021-04-18 ENCOUNTER — Encounter (HOSPITAL_COMMUNITY)
Admission: RE | Disposition: A | Discharge status: Home / Self Care | Payer: Self-pay | Source: Other Acute Inpatient Hospital | Attending: Orthopaedic Surgery

## 2021-04-18 ENCOUNTER — Ambulatory Visit (HOSPITAL_COMMUNITY): Payer: PPO

## 2021-04-18 DIAGNOSIS — E7800 Pure hypercholesterolemia, unspecified: Secondary | ICD-10-CM | POA: Diagnosis not present

## 2021-04-18 DIAGNOSIS — Z87891 Personal history of nicotine dependence: Secondary | ICD-10-CM | POA: Diagnosis not present

## 2021-04-18 DIAGNOSIS — M1612 Unilateral primary osteoarthritis, left hip: Principal | ICD-10-CM | POA: Diagnosis present

## 2021-04-18 DIAGNOSIS — G4700 Insomnia, unspecified: Secondary | ICD-10-CM | POA: Diagnosis not present

## 2021-04-18 DIAGNOSIS — I10 Essential (primary) hypertension: Secondary | ICD-10-CM | POA: Diagnosis not present

## 2021-04-18 DIAGNOSIS — Z96642 Presence of left artificial hip joint: Secondary | ICD-10-CM | POA: Diagnosis not present

## 2021-04-18 DIAGNOSIS — Z419 Encounter for procedure for purposes other than remedying health state, unspecified: Secondary | ICD-10-CM

## 2021-04-18 DIAGNOSIS — Z471 Aftercare following joint replacement surgery: Secondary | ICD-10-CM | POA: Diagnosis not present

## 2021-04-18 DIAGNOSIS — Z85828 Personal history of other malignant neoplasm of skin: Secondary | ICD-10-CM | POA: Insufficient documentation

## 2021-04-18 HISTORY — DX: Unilateral primary osteoarthritis, left hip: M16.12

## 2021-04-18 HISTORY — PX: TOTAL HIP ARTHROPLASTY: SHX124

## 2021-04-18 LAB — TYPE AND SCREEN
ABO/RH(D): O POS
Antibody Screen: NEGATIVE

## 2021-04-18 LAB — ABO/RH: ABO/RH(D): O POS

## 2021-04-18 SURGERY — ARTHROPLASTY, HIP, TOTAL, ANTERIOR APPROACH
Anesthesia: Monitor Anesthesia Care | Site: Hip | Laterality: Left

## 2021-04-18 MED ORDER — ONDANSETRON HCL 4 MG/2ML IJ SOLN: 4.0000 mg | Freq: Four times a day (QID) | INTRAMUSCULAR | Status: DC | PRN

## 2021-04-18 MED ORDER — PROPOFOL 500 MG/50ML IV EMUL
INTRAVENOUS | Status: DC | PRN
  Administered 2021-04-18: 75 ug/kg/min via INTRAVENOUS

## 2021-04-18 MED ORDER — CEFAZOLIN SODIUM-DEXTROSE 2-4 GM/100ML-% IV SOLN
2.0000 g | INTRAVENOUS | Status: AC
  Administered 2021-04-18: 2 g via INTRAVENOUS
  Filled 2021-04-18: qty 100

## 2021-04-18 MED ORDER — MORPHINE SULFATE (PF) 4 MG/ML IV SOLN
0.5000 mg | INTRAVENOUS | Status: DC | PRN
  Filled 2021-04-18: qty 1

## 2021-04-18 MED ORDER — PROPOFOL 10 MG/ML IV BOLUS
INTRAVENOUS | Status: AC
  Filled 2021-04-18: qty 20

## 2021-04-18 MED ORDER — ONDANSETRON HCL 4 MG/2ML IJ SOLN
INTRAMUSCULAR | Status: AC
  Filled 2021-04-18: qty 2

## 2021-04-18 MED ORDER — HYDROCODONE-ACETAMINOPHEN 7.5-325 MG PO TABS
ORAL_TABLET | ORAL | Status: AC
  Administered 2021-04-18: 1 via ORAL
  Filled 2021-04-18: qty 1

## 2021-04-18 MED ORDER — DEXAMETHASONE SODIUM PHOSPHATE 10 MG/ML IJ SOLN
INTRAMUSCULAR | Status: DC | PRN
  Administered 2021-04-18: 8 mg via INTRAVENOUS

## 2021-04-18 MED ORDER — TRANEXAMIC ACID-NACL 1000-0.7 MG/100ML-% IV SOLN
1000.0000 mg | Freq: Once | INTRAVENOUS | Status: AC
  Administered 2021-04-18: 1000 mg via INTRAVENOUS

## 2021-04-18 MED ORDER — METOCLOPRAMIDE HCL 5 MG PO TABS: 5.0000 mg | ORAL_TABLET | Freq: Three times a day (TID) | ORAL | Status: DC | PRN

## 2021-04-18 MED ORDER — POVIDONE-IODINE 10 % EX SWAB
2.0000 "application " | Freq: Once | CUTANEOUS | Status: AC
  Administered 2021-04-18: 2 via TOPICAL

## 2021-04-18 MED ORDER — MEPIVACAINE HCL (PF) 2 % IJ SOLN
INTRAMUSCULAR | Status: DC | PRN
  Administered 2021-04-18: 3 mL via EPIDURAL

## 2021-04-18 MED ORDER — ONDANSETRON HCL 4 MG PO TABS
4.0000 mg | ORAL_TABLET | Freq: Four times a day (QID) | ORAL | Status: DC | PRN
  Filled 2021-04-18: qty 1

## 2021-04-18 MED ORDER — LACTATED RINGERS IV SOLN: INTRAVENOUS | Status: DC

## 2021-04-18 MED ORDER — HYDROCODONE-ACETAMINOPHEN 7.5-325 MG PO TABS
1.0000 | ORAL_TABLET | ORAL | Status: DC | PRN
  Administered 2021-04-18: 1 via ORAL
  Filled 2021-04-18: qty 1

## 2021-04-18 MED ORDER — AMLODIPINE BESYLATE 5 MG PO TABS
5.0000 mg | ORAL_TABLET | Freq: Every day | ORAL | Status: DC
  Administered 2021-04-18 – 2021-04-19 (×2): 5 mg via ORAL
  Filled 2021-04-18 (×2): qty 1

## 2021-04-18 MED ORDER — LACTATED RINGERS IV BOLUS
250.0000 mL | Freq: Once | INTRAVENOUS | Status: AC
  Administered 2021-04-18: 250 mL via INTRAVENOUS

## 2021-04-18 MED ORDER — METOPROLOL SUCCINATE ER 25 MG PO TB24
25.0000 mg | ORAL_TABLET | Freq: Every day | ORAL | Status: DC
  Administered 2021-04-19: 25 mg via ORAL
  Filled 2021-04-18: qty 1

## 2021-04-18 MED ORDER — HYDRALAZINE HCL 20 MG/ML IJ SOLN
INTRAMUSCULAR | Status: AC
  Filled 2021-04-18: qty 1

## 2021-04-18 MED ORDER — FENTANYL CITRATE (PF) 100 MCG/2ML IJ SOLN
INTRAMUSCULAR | Status: AC
  Filled 2021-04-18: qty 2

## 2021-04-18 MED ORDER — CEFAZOLIN SODIUM-DEXTROSE 1-4 GM/50ML-% IV SOLN
INTRAVENOUS | Status: AC
  Filled 2021-04-18: qty 50

## 2021-04-18 MED ORDER — HYDRALAZINE HCL 20 MG/ML IJ SOLN
10.0000 mg | Freq: Once | INTRAMUSCULAR | Status: AC
  Administered 2021-04-18: 10 mg via INTRAVENOUS

## 2021-04-18 MED ORDER — ONDANSETRON HCL 4 MG/2ML IJ SOLN
INTRAMUSCULAR | Status: DC | PRN
  Administered 2021-04-18: 4 mg via INTRAVENOUS

## 2021-04-18 MED ORDER — ORAL CARE MOUTH RINSE: 15.0000 mL | Freq: Once | OROMUCOSAL | Status: AC

## 2021-04-18 MED ORDER — LACTATED RINGERS IV BOLUS: 250.0000 mL | Freq: Once | INTRAVENOUS | Status: DC

## 2021-04-18 MED ORDER — PROPOFOL 10 MG/ML IV BOLUS
INTRAVENOUS | Status: DC | PRN
  Administered 2021-04-18: 20 mg via INTRAVENOUS

## 2021-04-18 MED ORDER — DOCUSATE SODIUM 100 MG PO CAPS
100.0000 mg | ORAL_CAPSULE | Freq: Two times a day (BID) | ORAL | Status: DC
  Administered 2021-04-18 – 2021-04-19 (×2): 100 mg via ORAL
  Filled 2021-04-18 (×2): qty 1

## 2021-04-18 MED ORDER — METHOCARBAMOL 500 MG PO TABS
500.0000 mg | ORAL_TABLET | Freq: Four times a day (QID) | ORAL | Status: DC | PRN
  Administered 2021-04-18: 500 mg via ORAL
  Filled 2021-04-18: qty 1

## 2021-04-18 MED ORDER — HYDROCODONE-ACETAMINOPHEN 5-325 MG PO TABS: 1.0000 | ORAL_TABLET | ORAL | Status: DC | PRN

## 2021-04-18 MED ORDER — CHLORHEXIDINE GLUCONATE 0.12 % MT SOLN
15.0000 mL | Freq: Once | OROMUCOSAL | Status: AC
  Administered 2021-04-18: 15 mL via OROMUCOSAL

## 2021-04-18 MED ORDER — LABETALOL HCL 5 MG/ML IV SOLN
INTRAVENOUS | Status: AC
  Filled 2021-04-18: qty 4

## 2021-04-18 MED ORDER — TIZANIDINE HCL 4 MG PO TABS: 4.0000 mg | ORAL_TABLET | Freq: Four times a day (QID) | ORAL | 1 refills | Status: DC | PRN

## 2021-04-18 MED ORDER — ROSUVASTATIN CALCIUM 20 MG PO TABS
20.0000 mg | ORAL_TABLET | Freq: Every day | ORAL | Status: DC
  Administered 2021-04-18 – 2021-04-19 (×2): 20 mg via ORAL
  Filled 2021-04-18 (×2): qty 1

## 2021-04-18 MED ORDER — ACETAMINOPHEN 325 MG PO TABS: 325.0000 mg | ORAL_TABLET | Freq: Four times a day (QID) | ORAL | Status: DC | PRN

## 2021-04-18 MED ORDER — PRIMIDONE 50 MG PO TABS
50.0000 mg | ORAL_TABLET | Freq: Every day | ORAL | Status: DC
  Administered 2021-04-18: 50 mg via ORAL
  Filled 2021-04-18: qty 1

## 2021-04-18 MED ORDER — PHENYLEPHRINE HCL (PRESSORS) 10 MG/ML IV SOLN
INTRAVENOUS | Status: AC
  Filled 2021-04-18: qty 1

## 2021-04-18 MED ORDER — CEFAZOLIN SODIUM-DEXTROSE 2-4 GM/100ML-% IV SOLN
INTRAVENOUS | Status: AC
  Filled 2021-04-18: qty 100

## 2021-04-18 MED ORDER — LABETALOL HCL 5 MG/ML IV SOLN
10.0000 mg | Freq: Once | INTRAVENOUS | Status: AC
  Administered 2021-04-18: 10 mg via INTRAVENOUS

## 2021-04-18 MED ORDER — PHENYLEPHRINE HCL-NACL 10-0.9 MG/250ML-% IV SOLN
INTRAVENOUS | Status: DC | PRN
  Administered 2021-04-18: 25 ug/min via INTRAVENOUS

## 2021-04-18 MED ORDER — LOSARTAN POTASSIUM 50 MG PO TABS
50.0000 mg | ORAL_TABLET | Freq: Every day | ORAL | Status: DC
  Administered 2021-04-18 – 2021-04-19 (×2): 50 mg via ORAL
  Filled 2021-04-18 (×2): qty 1

## 2021-04-18 MED ORDER — METHOCARBAMOL 500 MG IVPB - SIMPLE MED
INTRAVENOUS | Status: AC
  Filled 2021-04-18: qty 50

## 2021-04-18 MED ORDER — HYDROCODONE-ACETAMINOPHEN 5-325 MG PO TABS: 1.0000 | ORAL_TABLET | Freq: Four times a day (QID) | ORAL | 0 refills | Status: DC | PRN

## 2021-04-18 MED ORDER — DIPHENHYDRAMINE HCL 12.5 MG/5ML PO ELIX: 12.5000 mg | ORAL_SOLUTION | ORAL | Status: DC | PRN

## 2021-04-18 MED ORDER — METOCLOPRAMIDE HCL 5 MG/ML IJ SOLN: 5.0000 mg | Freq: Three times a day (TID) | INTRAMUSCULAR | Status: DC | PRN

## 2021-04-18 MED ORDER — TRANEXAMIC ACID-NACL 1000-0.7 MG/100ML-% IV SOLN
1000.0000 mg | INTRAVENOUS | Status: AC
  Administered 2021-04-18: 1000 mg via INTRAVENOUS
  Filled 2021-04-18: qty 100

## 2021-04-18 MED ORDER — FENTANYL CITRATE (PF) 100 MCG/2ML IJ SOLN
INTRAMUSCULAR | Status: DC | PRN
  Administered 2021-04-18: 50 ug via INTRAVENOUS

## 2021-04-18 MED ORDER — PHENOL 1.4 % MT LIQD: 1.0000 | OROMUCOSAL | Status: DC | PRN

## 2021-04-18 MED ORDER — ALUM & MAG HYDROXIDE-SIMETH 200-200-20 MG/5ML PO SUSP: 30.0000 mL | ORAL | Status: DC | PRN

## 2021-04-18 MED ORDER — METHOCARBAMOL 500 MG IVPB - SIMPLE MED
500.0000 mg | Freq: Four times a day (QID) | INTRAVENOUS | Status: DC | PRN
  Administered 2021-04-18: 500 mg via INTRAVENOUS
  Filled 2021-04-18: qty 50

## 2021-04-18 MED ORDER — CEFAZOLIN SODIUM-DEXTROSE 2-4 GM/100ML-% IV SOLN
2.0000 g | Freq: Four times a day (QID) | INTRAVENOUS | Status: AC
  Administered 2021-04-18 (×2): 2 g via INTRAVENOUS
  Filled 2021-04-18: qty 100

## 2021-04-18 MED ORDER — KETOROLAC TROMETHAMINE 15 MG/ML IJ SOLN
INTRAMUSCULAR | Status: AC
  Filled 2021-04-18: qty 1

## 2021-04-18 MED ORDER — BUPIVACAINE-EPINEPHRINE (PF) 0.25% -1:200000 IJ SOLN
INTRAMUSCULAR | Status: AC
  Filled 2021-04-18: qty 30

## 2021-04-18 MED ORDER — FINASTERIDE 5 MG PO TABS
5.0000 mg | ORAL_TABLET | Freq: Every day | ORAL | Status: DC
  Administered 2021-04-19: 5 mg via ORAL
  Filled 2021-04-18: qty 1

## 2021-04-18 MED ORDER — LACTATED RINGERS IV BOLUS
500.0000 mL | Freq: Once | INTRAVENOUS | Status: AC
  Administered 2021-04-18: 500 mL via INTRAVENOUS

## 2021-04-18 MED ORDER — TRANEXAMIC ACID-NACL 1000-0.7 MG/100ML-% IV SOLN
INTRAVENOUS | Status: AC
  Filled 2021-04-18: qty 100

## 2021-04-18 MED ORDER — ASPIRIN 81 MG PO CHEW
81.0000 mg | CHEWABLE_TABLET | Freq: Two times a day (BID) | ORAL | Status: DC
  Administered 2021-04-19: 81 mg via ORAL
  Filled 2021-04-18: qty 1

## 2021-04-18 MED ORDER — ASPIRIN EC 81 MG PO TBEC: 81.0000 mg | DELAYED_RELEASE_TABLET | Freq: Two times a day (BID) | ORAL | 0 refills | Status: DC

## 2021-04-18 MED ORDER — MENTHOL 3 MG MT LOZG: 1.0000 | LOZENGE | OROMUCOSAL | Status: DC | PRN

## 2021-04-18 MED ORDER — 0.9 % SODIUM CHLORIDE (POUR BTL) OPTIME
TOPICAL | Status: DC | PRN
  Administered 2021-04-18: 1000 mL

## 2021-04-18 MED ORDER — BISACODYL 5 MG PO TBEC: 5.0000 mg | DELAYED_RELEASE_TABLET | Freq: Every day | ORAL | Status: DC | PRN

## 2021-04-18 MED ORDER — KETOROLAC TROMETHAMINE 15 MG/ML IJ SOLN
7.5000 mg | Freq: Four times a day (QID) | INTRAMUSCULAR | Status: AC
  Administered 2021-04-18 – 2021-04-19 (×4): 7.5 mg via INTRAVENOUS
  Filled 2021-04-18 (×3): qty 1

## 2021-04-18 MED ORDER — TRANEXAMIC ACID 1000 MG/10ML IV SOLN
INTRAVENOUS | Status: DC | PRN
  Administered 2021-04-18: 2000 mg via TOPICAL

## 2021-04-18 MED ORDER — DEXAMETHASONE SODIUM PHOSPHATE 10 MG/ML IJ SOLN
INTRAMUSCULAR | Status: AC
  Filled 2021-04-18: qty 1

## 2021-04-18 MED ORDER — ACETAMINOPHEN 500 MG PO TABS
500.0000 mg | ORAL_TABLET | Freq: Four times a day (QID) | ORAL | Status: DC
Start: 2021-04-18 — End: 2021-04-19
  Administered 2021-04-18 – 2021-04-19 (×3): 500 mg via ORAL
  Filled 2021-04-18 (×3): qty 1

## 2021-04-18 MED ORDER — BUPIVACAINE-EPINEPHRINE (PF) 0.25% -1:200000 IJ SOLN
INTRAMUSCULAR | Status: DC | PRN
  Administered 2021-04-18: 10 mL

## 2021-04-18 MED ORDER — BUPIVACAINE LIPOSOME 1.3 % IJ SUSP
INTRAMUSCULAR | Status: DC | PRN
  Administered 2021-04-18: 10 mL

## 2021-04-18 SURGICAL SUPPLY — 47 items
BAG DECANTER FOR FLEXI CONT (MISCELLANEOUS) ×3 IMPLANT
BLADE SAW SGTL 18X1.27X75 (BLADE) ×2 IMPLANT
BLADE SAW SGTL 18X1.27X75MM (BLADE) ×1
BOOTIES KNEE HIGH SLOAN (MISCELLANEOUS) ×3 IMPLANT
CELLS DAT CNTRL 66122 CELL SVR (MISCELLANEOUS) ×1 IMPLANT
COVER PERINEAL POST (MISCELLANEOUS) ×3 IMPLANT
COVER SURGICAL LIGHT HANDLE (MISCELLANEOUS) ×3 IMPLANT
COVER WAND RF STERILE (DRAPES) IMPLANT
CUP PINN GRIPTON 58 100 (Cup) ×2 IMPLANT
DECANTER SPIKE VIAL GLASS SM (MISCELLANEOUS) ×3 IMPLANT
DRAPE FOOT SWITCH (DRAPES) ×3 IMPLANT
DRAPE IMP U-DRAPE 54X76 (DRAPES) ×3 IMPLANT
DRAPE ORTHO SPLIT 77X108 STRL (DRAPES)
DRAPE STERI IOBAN 125X83 (DRAPES) ×3 IMPLANT
DRAPE SURG ORHT 6 SPLT 77X108 (DRAPES) IMPLANT
DRAPE U-SHAPE 47X51 STRL (DRAPES) ×6 IMPLANT
DRSG AQUACEL AG ADV 3.5X 6 (GAUZE/BANDAGES/DRESSINGS) ×3 IMPLANT
DURAPREP 26ML APPLICATOR (WOUND CARE) ×3 IMPLANT
ELECT BLADE TIP CTD 4 INCH (ELECTRODE) ×3 IMPLANT
ELECT REM PT RETURN 15FT ADLT (MISCELLANEOUS) ×3 IMPLANT
ELIMINATOR HOLE APEX DEPUY (Hips) ×2 IMPLANT
GLOVE SRG 8 PF TXTR STRL LF DI (GLOVE) ×2 IMPLANT
GLOVE SURG ENC MOIS LTX SZ8 (GLOVE) ×6 IMPLANT
GLOVE SURG UNDER POLY LF SZ8 (GLOVE) ×6
GOWN STRL REUS W/TWL XL LVL3 (GOWN DISPOSABLE) ×6 IMPLANT
HEAD M SROM 36MM PLUS 1.5 (Hips) IMPLANT
HOLDER FOLEY CATH W/STRAP (MISCELLANEOUS) ×3 IMPLANT
KIT TURNOVER KIT A (KITS) ×3 IMPLANT
LINER NEUTRAL 36X58 PLUS4 ×2 IMPLANT
MANIFOLD NEPTUNE II (INSTRUMENTS) ×3 IMPLANT
NEEDLE HYPO 22GX1.5 SAFETY (NEEDLE) ×3 IMPLANT
NS IRRIG 1000ML POUR BTL (IV SOLUTION) ×3 IMPLANT
PACK ANTERIOR HIP CUSTOM (KITS) ×3 IMPLANT
PENCIL SMOKE EVACUATOR (MISCELLANEOUS) IMPLANT
PROTECTOR NERVE ULNAR (MISCELLANEOUS) ×3 IMPLANT
RETRACTOR WND ALEXIS 18 MED (MISCELLANEOUS) ×1 IMPLANT
RTRCTR WOUND ALEXIS 18CM MED (MISCELLANEOUS) ×3
SROM M HEAD 36MM PLUS 1.5 (Hips) ×3 IMPLANT
STEM FEM ACTIS HIGH SZ8 (Stem) ×2 IMPLANT
SUT ETHIBOND NAB CT1 #1 30IN (SUTURE) ×6 IMPLANT
SUT VIC AB 1 CT1 36 (SUTURE) ×3 IMPLANT
SUT VIC AB 2-0 CT1 27 (SUTURE) ×3
SUT VIC AB 2-0 CT1 TAPERPNT 27 (SUTURE) ×1 IMPLANT
SUT VICRYL AB 3-0 FS1 BRD 27IN (SUTURE) ×3 IMPLANT
SUT VLOC 180 0 24IN GS25 (SUTURE) ×3 IMPLANT
SYR 50ML LL SCALE MARK (SYRINGE) ×3 IMPLANT
TRAY FOLEY MTR SLVR 16FR STAT (SET/KITS/TRAYS/PACK) ×3 IMPLANT

## 2021-04-18 NOTE — Evaluation (Signed)
Physical Therapy Evaluation Patient Details Name: Eric Simmons. MRN: [id number] DOB: [birth year] Today's Date: 04/18/2021   History of Present Illness  Patient is 80 y.o. male s/p Lt THA anterior approacho n 04/18/21 with PMH significant for OA, skin cancer.    Clinical Impression  Eric Simmons. is a 80 y.o. male POD 0 s/p Lt THA. Patient reports independence with mobility at baseline. Patient is now limited by functional impairments (see PT problem list below) and requires min assist for transfers and gait with RW. Patient was able to ambulate ~60 feet with RW and min assist. Patient instructed in exercise to facilitate circulation to manage edema and reduce risk of DVT. Patient will benefit from continued skilled PT interventions to address impairments and progress towards PLOF. Acute PT will follow to progress mobility and stair training in preparation for safe discharge home.     Follow Up Recommendations Follow surgeon's recommendation for DC plan and follow-up therapies;Home health PT    Equipment Recommendations  None recommended by PT    Recommendations for Other Services       Precautions / Restrictions Precautions Precautions: Fall Restrictions Weight Bearing Restrictions: No Other Position/Activity Restrictions: WBAT      Mobility  Bed Mobility Overal bed mobility: Needs Assistance Bed Mobility: Supine to Sit     Supine to sit: Min assist;HOB elevated     General bed mobility comments: cues to reach for bed rail, HOB elevated, assist for bil LE off EOB and to raise trunk.    Transfers Overall transfer level: Needs assistance Equipment used: Rolling walker (2 wheeled) Transfers: Sit to/from Stand Sit to Stand: Min assist;From elevated surface         General transfer comment: cues for technique with RW, assist for power up from EOB and safe reach back to recliner.  Ambulation/Gait Ambulation/Gait assistance: Min assist Gait Distance (Feet): 60  Feet Assistive device: Rolling walker (2 wheeled) Gait Pattern/deviations: Step-to pattern;Decreased stride length Gait velocity: decr   General Gait Details: repeated cues required for pt to roll walker and to maintain safe proximity throughout. patient required min assit to steady and amb ~50' in hallway and then into bathroom and to recliner.  Stairs            Wheelchair Mobility    Modified Rankin (Stroke Patients Only)       Balance Overall balance assessment: Needs assistance Sitting-balance support: Feet supported Sitting balance-Leahy Scale: Good     Standing balance support: During functional activity;Bilateral upper extremity supported Standing balance-Leahy Scale: Fair Pt able to void bladder with urinal and no UE support in static standing. Pt reports and noted Rt UE resting tremor while holding urinal , reports this is present at baseline.                                Pertinent Vitals/Pain Pain Assessment: Faces Faces Pain Scale: Hurts little more Pain Location: Lt hip Pain Descriptors / Indicators: Aching;Discomfort Pain Intervention(s): Limited activity within patient's tolerance;Monitored during session;Repositioned;Premedicated before session    Home Living Family/patient expects to be discharged to:: Private residence Living Arrangements: Spouse/significant other Available Help at Discharge: Family Type of Home: House Home Access: Stairs to enter Entrance Stairs-Rails: None Entrance Stairs-Number of Steps: 2 Home Layout: Two level;Full bath on main level;Able to live on main level with bedroom/bathroom Home Equipment: Gilford Rile - 2 wheels;Cane - single point;Bedside commode  Prior Function Level of Independence: Independent               Hand Dominance   Dominant Hand: Right    Extremity/Trunk Assessment   Upper Extremity Assessment Upper Extremity Assessment: Overall WFL for tasks assessed    Lower Extremity  Assessment Lower Extremity Assessment: Overall WFL for tasks assessed    Cervical / Trunk Assessment Cervical / Trunk Assessment: Normal  Communication   Communication: No difficulties  Cognition Arousal/Alertness: Awake/alert Behavior During Therapy: WFL for tasks assessed/performed Overall Cognitive Status: Within Functional Limits for tasks assessed                                        General Comments      Exercises Total Joint Exercises Ankle Circles/Pumps: AROM;Both;20 reps;Seated   Assessment/Plan    PT Assessment Patient needs continued PT services  PT Problem List Decreased strength;Decreased range of motion;Decreased activity tolerance;Decreased balance;Decreased mobility;Decreased knowledge of use of DME;Decreased knowledge of precautions;Pain       PT Treatment Interventions DME instruction;Gait training;Stair training;Functional mobility training;Therapeutic activities;Therapeutic exercise;Balance training;Patient/family education    PT Goals (Current goals can be found in the Care Plan section)  Acute Rehab PT Goals Patient Stated Goal: recover and get back to walking 17 laps (1.5 miles) around the track at the [facility] PT Goal Formulation: With patient Time For Goal Achievement: 04/25/21 Potential to Achieve Goals: Good    Frequency 7X/week   Barriers to discharge        Co-evaluation               AM-PAC PT "6 Clicks" Mobility  Outcome Measure Help needed turning from your back to your side while in a flat bed without using bedrails?: A Little Help needed moving from lying on your back to sitting on the side of a flat bed without using bedrails?: A Little Help needed moving to and from a bed to a chair (including a wheelchair)?: A Little Help needed standing up from a chair using your arms (e.g., wheelchair or bedside chair)?: A Little Help needed to walk in hospital room?: A Little Help needed climbing 3-5 steps with a railing?  : A Lot 6 Click Score: 17    End of Session Equipment Utilized During Treatment: Gait belt Activity Tolerance: Patient tolerated treatment well Patient left: in chair;with call bell/phone within reach;with chair alarm set;with family/visitor present Nurse Communication: Mobility status PT Visit Diagnosis: Muscle weakness (generalized) (M62.81);Difficulty in walking, not elsewhere classified (R26.2)    Time: 4163-8453 PT Time Calculation (min) (ACUTE ONLY): 34 min   Charges:   PT Evaluation $PT Eval Low Complexity: 1 Low PT Treatments $Gait Training: 8-22 mins        [name], DPT Acute Rehabilitation Services Office [phone] Pager [phone]   [name] 04/18/2021, 7:36 PM

## 2021-04-18 NOTE — Interval H&P Note (Signed)
History and Physical Interval Note:  04/18/2021 7:24 AM  Eric Simmons.  has presented today for surgery, with the diagnosis of LEFT HIP DEGENERATIVE JOINT DISEASE.  The various methods of treatment have been discussed with the patient and family. After consideration of risks, benefits and other options for treatment, the patient has consented to  Procedure(s): LEFT TOTAL HIP ARTHROPLASTY ANTERIOR APPROACH (Left) as a surgical intervention.  The patient's history has been reviewed, patient examined, no change in status, stable for surgery.  I have reviewed the patient's chart and labs.  Questions were answered to the patient's satisfaction.     Hessie Dibble

## 2021-04-18 NOTE — Transfer of Care (Signed)
Immediate Anesthesia Transfer of Care Note  Patient: Eric Simmons.  Procedure(s) Performed: LEFT TOTAL HIP ARTHROPLASTY ANTERIOR APPROACH (Left: Hip)  Patient Location: PACU  Anesthesia Type:Spinal  Level of Consciousness: awake, alert , oriented and patient cooperative  Airway & Oxygen Therapy: Patient Spontanous Breathing and Patient connected to face mask oxygen  Post-op Assessment: Report given to RN and Post -op Vital signs reviewed and stable  Post vital signs: stable  Last Vitals:  Vitals Value Taken Time  BP 122/64 04/18/21 0915  Temp    Pulse 51 04/18/21 0916  Resp 12 04/18/21 0916  SpO2 100 % 04/18/21 0916  Vitals shown include unvalidated device data.  Last Pain:  Vitals:   04/18/21 0619  TempSrc:   PainSc: 0-No pain         Complications: No notable events documented.

## 2021-04-18 NOTE — Anesthesia Postprocedure Evaluation (Signed)
Anesthesia Post Note  Patient: Eric Simmons.  Procedure(s) Performed: LEFT TOTAL HIP ARTHROPLASTY ANTERIOR APPROACH (Left: Hip)     Patient location during evaluation: PACU Anesthesia Type: MAC and Spinal Level of consciousness: awake and alert Pain management: pain level controlled Vital Signs Assessment: post-procedure vital signs reviewed and stable Respiratory status: spontaneous breathing, nonlabored ventilation, respiratory function stable and patient connected to nasal cannula oxygen Cardiovascular status: blood pressure returned to baseline and stable Postop Assessment: no apparent nausea or vomiting Anesthetic complications: no   No notable events documented.  Last Vitals:  Vitals:   04/18/21 1115 04/18/21 1145  BP: (!) 176/95 (!) 179/104  Pulse: 71 67  Resp: 14 11  Temp:    SpO2: 100% 100%    Last Pain:  Vitals:   04/18/21 1145  TempSrc:   PainSc: 0-No pain                 [name]

## 2021-04-18 NOTE — Op Note (Signed)
PRE-OP DIAGNOSIS:  LEFT HIP DEGENERATIVE JOINT DISEASE POST-OP DIAGNOSIS: same PROCEDURE:  LEFT TOTAL HIP ARTHROPLASTY ANTERIOR APPROACH ANESTHESIA:  Spinal and MAC SURGEON:  Melrose Nakayama MD ASSISTANT:  Loni Dolly PA-C   INDICATIONS FOR PROCEDURE:  The patient is a 80 y.o. male with a long history of a painful hip.  This has persisted despite multiple conservative measures.  The patient has persisted with pain and dysfunction making rest and activity difficult.  A total hip replacement is offered as surgical treatment.  Informed operative consent was obtained after discussion of possible complications including reaction to anesthesia, infection, neurovascular injury, dislocation, DVT, PE, and death.  The importance of the postoperative rehab program to optimize result was stressed with the patient.  SUMMARY OF FINDINGS AND PROCEDURE:  Under the above anesthesia through a anterior approach an the Hana table a left THR was performed.  The patient had severe degenerative change and excellent bone quality.  We used DePuy components to replace the hip and these were size 8 high offset Actis femur capped with a +1.5 8m metal hip ball.  On the acetabular side we used a size 58 Gription shell with a plus 4 polyethylene liner.  We did use a hole eliminator.  ALoni DollyPA-C assisted throughout and was invaluable to the completion of the case in that he helped position and retract while I performed the procedure.  He also closed simultaneously to help minimize OR time.  I used fluoroscopy throughout the case to check position of components and leg lengths and read all these views myself.  DESCRIPTION OF PROCEDURE:  The patient was taken to the OR suite where the above anesthetic was applied.  The patient was then positioned on the Hana table supine.  All bony prominences were appropriately padded.  Prep and drape was then performed in normal sterile fashion.  The patient was given kefzol preoperative  antibiotic and an appropriate time out was performed.  We then took an anterior approach to the left hip.  Dissection was taken through adipose to the tensor fascia lata fascia.  This structure was incised longitudinally and we dissected in the intermuscular interval just medial to this muscle.  Cobra retractors were placed superior and inferior to the femoral neck superficial to the capsule.  A capsular incision was then made and the retractors were placed along the femoral neck.  Xray was brought in to get a good level for the femoral neck cut which was made with an oscillating saw and osteotome.  The femoral head was removed with a corkscrew.  The acetabulum was exposed and some labral tissues were excised. Reaming was taken to the inside wall of the pelvis and sequentially up to 1 mm smaller than the actual component.  A trial of components was done and then the aforementioned acetabular shell was placed in appropriate tilt and anteversion confirmed by fluoroscopy. The liner was placed along with the hole eliminator and attention was turned to the femur.  The leg was brought down and over into adduction and the elevator bar was used to raise the femur up gently in the wound.  The piriformis was released with care taken to preserve the obturator internus attachment and all of the posterior capsule. The femur was reamed and then broached to the appropriate size.  A trial reduction was done and the aforementioned head and neck assembly gave uKoreathe best stability in extension with external rotation.  Leg lengths were felt to be about equal by  fluoroscopic exam.  The trial components were removed and the wound irrigated.  We then placed the femoral component in appropriate anteversion.  The head was applied to a dry stem neck and the hip again reduced.  It was again stable in the aforementioned position.  The would was irrigated again followed by re-approximation of anterior capsule with ethibond suture. Tensor  fascia was repaired with V-loc suture  followed by deep closure with #O and #2 undyed vicryl.  Skin was closed with subQ stitch and steristrips followed by a sterile dressing.  EBL and IOF can be obtained from anesthesia records.  DISPOSITION:  The patient was extubated in the OR and taken to PACU in stable condition to be admitted to the Orthopedic Surgery for appropriate post-op care to include perioperative antibiotics and DVT prophylaxis.

## 2021-04-18 NOTE — Anesthesia Procedure Notes (Signed)
Spinal  Patient location during procedure: OR End time: 04/18/2021 7:31 AM Reason for block: surgical anesthesia Staffing Performed: resident/CRNA  Resident/CRNA: Lissa Morales, CRNA Preanesthetic Checklist Completed: patient identified, IV checked, site marked, risks and benefits discussed, surgical consent, monitors and equipment checked, pre-op evaluation and timeout performed Spinal Block Patient position: sitting Prep: Betadine and DuraPrep Patient monitoring: heart rate, continuous pulse ox and blood pressure Approach: midline Location: L4-5 Injection technique: single-shot Needle Needle type: Pencan  Needle gauge: 24 G Needle length: 9 cm Assessment Events: CSF return Additional Notes Expiration date of kit checked and confirmed. Patient tolerated procedure well, without complications.

## 2021-04-18 NOTE — Anesthesia Procedure Notes (Signed)
Procedure Name: MAC Date/Time: 04/18/2021 7:25 AM Performed by: Lissa Morales, CRNA Pre-anesthesia Checklist: Patient identified, Emergency Drugs available, Suction available and Patient being monitored Patient Re-evaluated:Patient Re-evaluated prior to induction Oxygen Delivery Method: Simple face mask Placement Confirmation: positive ETCO2

## 2021-04-19 ENCOUNTER — Encounter (HOSPITAL_COMMUNITY): Payer: Self-pay | Admitting: Orthopaedic Surgery

## 2021-04-19 DIAGNOSIS — M1612 Unilateral primary osteoarthritis, left hip: Secondary | ICD-10-CM | POA: Diagnosis not present

## 2021-04-19 NOTE — TOC Transition Note (Signed)
Transition of Care [facility]) - CM/SW Discharge Note   Patient Details  Name: Eric Simmons. MRN: [id number] Date of Birth: 01-03-1941  [facility] of [facility]) CM/SW Contact:  Lennart Pall, LCSW Phone Number: 04/19/2021, 10:18 AM   Clinical Narrative:    Met briefly with pt and confirming he has all needed DME at home.  Plan for OPPT at [place].  No TOC needs.   Final next level of care: OP Rehab Barriers to Discharge: No Barriers Identified   Patient Goals and CMS Choice Patient states their goals for this hospitalization and ongoing recovery are:: return home      Discharge Placement                       Discharge Plan and Services                DME Arranged: N/A DME Agency: NA                  Social Determinants of Health (SDOH) Interventions     Readmission Risk Interventions No flowsheet data found.

## 2021-04-19 NOTE — Discharge Summary (Signed)
Patient ID: Eric Simmons. MRN: [id number] DOB/AGE: 01/02/1941 80 y.o.  Admit date: 04/18/2021 Discharge date: 04/19/2021  Admission Diagnoses:  Principal Problem:   Primary osteoarthritis of left hip   Discharge Diagnoses:  Same  Past Medical History:  Diagnosis Date   Adenomatous colon polyp 03/2005   tubular adenoma   BCC (basal cell carcinoma), face    left face, R ear (04/2014), nose 05/2016   Benign familial tremor 08/29/2012   Erectile dysfunction    Hemorrhoids    internal and external   Hip arthritis    left   IFG (impaired fasting glucose) 12/10/2012   Glucose 111 11/2012    Impaired fasting glucose    since his 20's; had yearly GTT's for 5 yrs, never progressed   Insomnia    Mild neurocognitive disorder 09/11/2019   Pseudogout of knee 07/2003   left   Pure hypercholesterolemia     Surgeries: Procedure(s): LEFT TOTAL HIP ARTHROPLASTY ANTERIOR APPROACH on 04/18/2021   Consultants:   Discharged Condition: Improved  Hospital Course: Eric Simmons. is an 80 y.o. male who was admitted 04/18/2021 for operative treatment ofPrimary osteoarthritis of left hip. Patient has severe unremitting pain that affects sleep, daily activities, and work/hobbies. After pre-op clearance the patient was taken to the operating room on 04/18/2021 and underwent  Procedure(s): LEFT TOTAL HIP ARTHROPLASTY ANTERIOR APPROACH.    Patient was given perioperative antibiotics:  Anti-infectives (From admission, onward)    Start     Dose/Rate Route Frequency Ordered Stop   04/18/21 1538  ceFAZolin (ANCEF) 1-4 GM/50ML-% IVPB  Status:  Discontinued       Note to Pharmacy: Lanell Persons  : cabinet override      04/18/21 1538 04/18/21 1544   04/18/21 1538  ceFAZolin (ANCEF) 2-4 GM/100ML-% IVPB       Note to Pharmacy: Lanell Persons  : cabinet override      04/18/21 1538 04/18/21 1718   04/18/21 1400  ceFAZolin (ANCEF) IVPB 2g/100 mL premix        2 g 200 mL/hr over 30 Minutes  Intravenous Every 6 hours 04/18/21 0934 04/18/21 2117   04/18/21 0600  ceFAZolin (ANCEF) IVPB 2g/100 mL premix        2 g 200 mL/hr over 30 Minutes Intravenous On call to O.R. 04/18/21 8937 04/18/21 0727        Patient was given sequential compression devices, early ambulation, and chemoprophylaxis to prevent DVT.  Patient benefited maximally from hospital stay and there were no complications.    Recent vital signs: Patient Vitals for the past 24 hrs:  BP Temp Temp src Pulse Resp SpO2  04/19/21 0317 (!) 124/56 98.1 F (36.7 C) Oral 76 18 97 %  04/18/21 2317 (!) 143/86 98.2 F (36.8 C) -- 89 18 100 %  04/18/21 1928 121/85 -- -- 74 18 100 %  04/18/21 1705 (!) 151/105 98.1 F (36.7 C) Oral 85 16 100 %  04/18/21 1600 (!) 163/96 97.9 F (36.6 C) -- 80 16 100 %  04/18/21 1500 (!) 166/107 -- -- 87 16 100 %  04/18/21 1400 (!) 187/103 -- -- 88 16 100 %  04/18/21 1300 (!) 185/101 -- -- 84 16 100 %  04/18/21 1215 (!) 196/99 97.8 F (36.6 C) -- 80 16 100 %  04/18/21 1145 (!) 179/104 -- -- 67 11 100 %  04/18/21 1115 (!) 176/95 -- -- 71 14 100 %  04/18/21 1100 (!) 173/87 -- -- 66 14 100 %  04/18/21 1045 (!) 176/91 -- -- 64 14 99 %  04/18/21 1030 (!) 200/98 -- -- (!) 55 15 100 %  04/18/21 1015 (!) 185/101 -- -- (!) 51 12 98 %  04/18/21 1000 (!) 175/87 -- -- (!) 46 15 100 %  04/18/21 0945 (!) 164/87 -- -- (!) 49 20 100 %  04/18/21 0930 139/77 -- -- (!) 47 10 100 %  04/18/21 0915 122/64 97.7 F (36.5 C) -- (!) 49 17 100 %     Recent laboratory studies: No results for input(s): WBC, HGB, HCT, PLT, NA, K, CL, CO2, BUN, CREATININE, GLUCOSE, INR, CALCIUM in the last 72 hours.  Invalid input(s): PT, 2   Discharge Medications:   Allergies as of 04/19/2021       Reactions   Codeine    REACTION: violent vomiting        Medication List     TAKE these medications    acetaminophen 650 MG CR tablet Commonly known as: TYLENOL Take 650 mg by mouth every 8 (eight) hours as needed for  pain.   amLODipine 5 MG tablet Commonly known as: NORVASC Take 1 tablet (5 mg total) by mouth daily.   aspirin EC 81 MG tablet Take 1 tablet (81 mg total) by mouth 2 (two) times daily after a meal. Swallow whole.   cholecalciferol 1000 units tablet Commonly known as: VITAMIN D Take 1,000 Units by mouth daily.   Co-Enzyme Q-10 100 MG Caps Take 100 mg by mouth daily.   finasteride 5 MG tablet Commonly known as: PROSCAR Take 5 mg by mouth daily.   Glucosamine 1500 Complex Caps Take 1 capsule by mouth daily.   HYDROcodone-acetaminophen 5-325 MG tablet Commonly known as: NORCO/VICODIN Take 1-2 tablets by mouth every 6 (six) hours as needed for moderate pain or severe pain (post op pain).   losartan 50 MG tablet Commonly known as: COZAAR Take 1 tablet (50 mg total) by mouth daily.   metoprolol succinate 25 MG 24 hr tablet Commonly known as: TOPROL-XL TAKE ONE TABLET DAILY   rosuvastatin 20 MG tablet Commonly known as: CRESTOR Take 1 tablet (20 mg total) by mouth daily.   sildenafil 100 MG tablet Commonly known as: VIAGRA Take 100 mg by mouth daily as needed for erectile dysfunction.   tiZANidine 4 MG tablet Commonly known as: Zanaflex Take 1 tablet (4 mg total) by mouth every 6 (six) hours as needed for muscle spasms.   VITAMIN B 12 PO Take 1,000 mcg by mouth daily.       ASK your doctor about these medications    primidone 50 MG tablet Commonly known as: MYSOLINE TAKE ONE TABLET AT BEDTIME               Durable Medical Equipment  (From admission, onward)           Start     Ordered   04/18/21 1700  DME Walker rolling  Once       Question:  Patient needs a walker to treat with the following condition  Answer:  Primary osteoarthritis of left hip   04/18/21 1659   04/18/21 1700  DME 3 n 1  Once        04/18/21 1659   04/18/21 1700  DME Bedside commode  Once       Question:  Patient needs a bedside commode to treat with the following condition   Answer:  Primary osteoarthritis of left hip   04/18/21 1659  Diagnostic Studies: DG Chest 2 View  Result Date: 04/11/2021 CLINICAL DATA:  80 year old male with a history of pending surgery EXAM: CHEST - 2 VIEW COMPARISON:  03/10/2020 FINDINGS: Cardiomediastinal silhouette unchanged in size and contour. No evidence of central vascular congestion. No interlobular septal thickening. No pneumothorax or pleural effusion. Coarsened interstitial markings, with no confluent airspace disease. No acute displaced fracture. Degenerative changes of the spine. IMPRESSION: Negative for acute cardiopulmonary disease Electronically Signed   By: Corrie Mckusick D.O.   On: 04/11/2021 15:51   DG C-Arm 1-60 Min-No Report  Result Date: 04/18/2021 Fluoroscopy was utilized by the requesting physician.  No radiographic interpretation.   DG HIP OPERATIVE UNILAT W OR W/O PELVIS LEFT  Result Date: 04/18/2021 CLINICAL DATA:  Left hip replacement. EXAM: OPERATIVE LEFT HIP (WITH PELVIS IF PERFORMED) 2 VIEWS TECHNIQUE: Fluoroscopic spot image(s) were submitted for interpretation post-operatively. COMPARISON:  08/26/2014. FINDINGS: Total left hip replacement. Hardware intact. Anatomic alignment. Degenerative changes right hip. IMPRESSION: Total left hip replacement with anatomic alignment. Electronically Signed   By: Marcello Moores  Register   On: 04/18/2021 11:20    Disposition: Discharge disposition: 01-Home or Self Care       Discharge Instructions     Call MD / Call 911   Complete by: As directed    If you experience chest pain or shortness of breath, CALL 911 and be transported to the hospital emergency room.  If you develope a fever above 101 F, pus ([place] drainage) or increased drainage or redness at the wound, or calf pain, call your surgeon's office.   Call MD / Call 911   Complete by: As directed    If you experience chest pain or shortness of breath, CALL 911 and be transported to the hospital emergency  room.  If you develope a fever above 101 F, pus ([place] drainage) or increased drainage or redness at the wound, or calf pain, call your surgeon's office.   Constipation Prevention   Complete by: As directed    Drink plenty of fluids.  Prune juice may be helpful.  You may use a stool softener, such as Colace (over the counter) 100 mg twice a day.  Use MiraLax (over the counter) for constipation as needed.   Constipation Prevention   Complete by: As directed    Drink plenty of fluids.  Prune juice may be helpful.  You may use a stool softener, such as Colace (over the counter) 100 mg twice a day.  Use MiraLax (over the counter) for constipation as needed.   Diet - low sodium heart healthy   Complete by: As directed    Diet - low sodium heart healthy   Complete by: As directed    Discharge instructions   Complete by: As directed    INSTRUCTIONS AFTER JOINT REPLACEMENT   Remove items at home which could result in a fall. This includes throw rugs or furniture in walking pathways ICE to the affected joint every three hours while awake for 30 minutes at a time, for at least the first 3-5 days, and then as needed for pain and swelling.  Continue to use ice for pain and swelling. You may notice swelling that will progress down to the foot and ankle.  This is normal after surgery.  Elevate your leg when you are not up walking on it.   Continue to use the breathing machine you got in the hospital (incentive spirometer) which will help keep your temperature down.  It is common  for your temperature to cycle up and down following surgery, especially at night when you are not up moving around and exerting yourself.  The breathing machine keeps your lungs expanded and your temperature down.   DIET:  As you were doing prior to hospitalization, we recommend a well-balanced diet.  DRESSING / WOUND CARE / SHOWERING  You may shower 3 days after surgery, but keep the wounds dry during showering.  You may use an  occlusive plastic wrap (Press'n Seal for example), NO SOAKING/SUBMERGING IN THE BATHTUB.  If the bandage gets wet, change with a clean dry gauze.  If the incision gets wet, pat the wound dry with a clean towel.  ACTIVITY  Increase activity slowly as tolerated, but follow the weight bearing instructions below.   No driving for 6 weeks or until further direction given by your physician.  You cannot drive while taking narcotics.  No lifting or carrying greater than 10 lbs. until further directed by your surgeon. Avoid periods of inactivity such as sitting longer than an hour when not asleep. This helps prevent blood clots.  You may return to work once you are authorized by your doctor.     WEIGHT BEARING   Weight bearing as tolerated with assist device (walker, cane, etc) as directed, use it as long as suggested by your surgeon or therapist, typically at least 4-6 weeks.   EXERCISES  Results after joint replacement surgery are often greatly improved when you follow the exercise, range of motion and muscle strengthening exercises prescribed by your doctor. Safety measures are also important to protect the joint from further injury. Any time any of these exercises cause you to have increased pain or swelling, decrease what you are doing until you are comfortable again and then slowly increase them. If you have problems or questions, call your caregiver or physical therapist for advice.   Rehabilitation is important following a joint replacement. After just a few days of immobilization, the muscles of the leg can become weakened and shrink (atrophy).  These exercises are designed to build up the tone and strength of the thigh and leg muscles and to improve motion. Often times heat used for twenty to thirty minutes before working out will loosen up your tissues and help with improving the range of motion but do not use heat for the first two weeks following surgery (sometimes heat can increase  post-operative swelling).   These exercises can be done on a training (exercise) mat, on the floor, on a table or on a bed. Use whatever works the best and is most comfortable for you.    Use music or television while you are exercising so that the exercises are a pleasant break in your day. This will make your life better with the exercises acting as a break in your routine that you can look forward to.   Perform all exercises about fifteen times, three times per day or as directed.  You should exercise both the operative leg and the other leg as well.  Exercises include:   Quad Sets - Tighten up the muscle on the front of the thigh (Quad) and hold for 5-10 seconds.   Straight Leg Raises - With your knee straight (if you were given a brace, keep it on), lift the leg to 60 degrees, hold for 3 seconds, and slowly lower the leg.  Perform this exercise against resistance later as your leg gets stronger.  Leg Slides: Lying on your back, slowly slide your  foot toward your buttocks, bending your knee up off the floor (only go as far as is comfortable). Then slowly slide your foot back down until your leg is flat on the floor again.  Angel Wings: Lying on your back spread your legs to the side as far apart as you can without causing discomfort.  Hamstring Strength:  Lying on your back, push your heel against the floor with your leg straight by tightening up the muscles of your buttocks.  Repeat, but this time bend your knee to a comfortable angle, and push your heel against the floor.  You may put a pillow under the heel to make it more comfortable if necessary.   A rehabilitation program following joint replacement surgery can speed recovery and prevent re-injury in the future due to weakened muscles. Contact your doctor or a physical therapist for more information on knee rehabilitation.    CONSTIPATION  Constipation is defined medically as fewer than three stools per week and severe constipation as less  than one stool per week.  Even if you have a regular bowel pattern at home, your normal regimen is likely to be disrupted due to multiple reasons following surgery.  Combination of anesthesia, postoperative narcotics, change in appetite and fluid intake all can affect your bowels.   YOU MUST use at least one of the following options; they are listed in order of increasing strength to get the job done.  They are all available over the counter, and you may need to use some, POSSIBLY even all of these options:    Drink plenty of fluids (prune juice may be helpful) and high fiber foods Colace 100 mg by mouth twice a day  Senokot for constipation as directed and as needed Dulcolax (bisacodyl), take with full glass of water  Miralax (polyethylene glycol) once or twice a day as needed.  If you have tried all these things and are unable to have a bowel movement in the first 3-4 days after surgery call either your surgeon or your primary doctor.    If you experience loose stools or diarrhea, hold the medications until you stool forms back up.  If your symptoms do not get better within 1 week or if they get worse, check with your doctor.  If you experience "the worst abdominal pain ever" or develop nausea or vomiting, please contact the office immediately for further recommendations for treatment.   ITCHING:  If you experience itching with your medications, try taking only a single pain pill, or even half a pain pill at a time.  You can also use Benadryl over the counter for itching or also to help with sleep.   TED HOSE STOCKINGS:  Use stockings on both legs until for at least 2 weeks or as directed by physician office. They may be removed at night for sleeping.  MEDICATIONS:  See your medication summary on the "After Visit Summary" that nursing will review with you.  You may have some home medications which will be placed on hold until you complete the course of blood thinner medication.  It is important  for you to complete the blood thinner medication as prescribed.  PRECAUTIONS:  If you experience chest pain or shortness of breath - call 911 immediately for transfer to the hospital emergency department.   If you develop a fever greater that 101 F, purulent drainage from wound, increased redness or drainage from wound, foul odor from the wound/dressing, or calf pain - CONTACT YOUR SURGEON.  FOLLOW-UP APPOINTMENTS:  If you do not already have a post-op appointment, please call the office for an appointment to be seen by your surgeon.  Guidelines for how soon to be seen are listed in your "After Visit Summary", but are typically between 1-4 weeks after surgery.  OTHER INSTRUCTIONS:   Knee Replacement:  Do not place pillow under knee, focus on keeping the knee straight while resting. CPM instructions: 0-90 degrees, 2 hours in the morning, 2 hours in the afternoon, and 2 hours in the evening. Place foam block, curve side up under heel at all times except when in CPM or when walking.  DO NOT modify, tear, cut, or change the foam block in any way.  POST-OPERATIVE OPIOID TAPER INSTRUCTIONS: It is important to wean off of your opioid medication as soon as possible. If you do not need pain medication after your surgery it is ok to stop day one. Opioids include: Codeine, Hydrocodone(Norco, Vicodin), Oxycodone(Percocet, oxycontin) and hydromorphone amongst others.  Long term and even short term use of opiods can cause: Increased pain response Dependence Constipation Depression Respiratory depression And more.  Withdrawal symptoms can include Flu like symptoms Nausea, vomiting And more Techniques to manage these symptoms Hydrate well Eat regular healthy meals Stay active Use relaxation techniques(deep breathing, meditating, yoga) Do Not substitute Alcohol to help with tapering If you have been on opioids for less than two weeks and do not have  pain than it is ok to stop all together.  Plan to wean off of opioids This plan should start within one week post op of your joint replacement. Maintain the same interval or time between taking each dose and first decrease the dose.  Cut the total daily intake of opioids by one tablet each day Next start to increase the time between doses. The last dose that should be eliminated is the evening dose.     MAKE SURE YOU:  Understand these instructions.  Get help right away if you are not doing well or get worse.    Thank you for letting us be a part of your medical care team.  It is a privilege we respect greatly.  We hope these instructions will help you stay on track for a fast and full recovery!   Discharge instructions   Complete by: As directed    INSTRUCTIONS AFTER JOINT REPLACEMENT   Remove items at home which could result in a fall. This includes throw rugs or furniture in walking pathways ICE to the affected joint every three hours while awake for 30 minutes at a time, for at least the first 3-5 days, and then as needed for pain and swelling.  Continue to use ice for pain and swelling. You may notice swelling that will progress down to the foot and ankle.  This is normal after surgery.  Elevate your leg when you are not up walking on it.   Continue to use the breathing machine you got in the hospital (incentive spirometer) which will help keep your temperature down.  It is common for your temperature to cycle up and down following surgery, especially at night when you are not up moving around and exerting yourself.  The breathing machine keeps your lungs expanded and your temperature down.   DIET:  As you were doing prior to hospitalization, we recommend a well-balanced diet.  DRESSING / WOUND CARE / SHOWERING  You may shower 3 days after surgery, but keep the wounds dry during showering.  You may use  an occlusive plastic wrap (Press'n Seal for example), NO SOAKING/SUBMERGING IN  THE BATHTUB.  If the bandage gets wet, change with a clean dry gauze.  If the incision gets wet, pat the wound dry with a clean towel.  ACTIVITY  Increase activity slowly as tolerated, but follow the weight bearing instructions below.   No driving for 6 weeks or until further direction given by your physician.  You cannot drive while taking narcotics.  No lifting or carrying greater than 10 lbs. until further directed by your surgeon. Avoid periods of inactivity such as sitting longer than an hour when not asleep. This helps prevent blood clots.  You may return to work once you are authorized by your doctor.     WEIGHT BEARING   Weight bearing as tolerated with assist device (walker, cane, etc) as directed, use it as long as suggested by your surgeon or therapist, typically at least 4-6 weeks.   EXERCISES  Results after joint replacement surgery are often greatly improved when you follow the exercise, range of motion and muscle strengthening exercises prescribed by your doctor. Safety measures are also important to protect the joint from further injury. Any time any of these exercises cause you to have increased pain or swelling, decrease what you are doing until you are comfortable again and then slowly increase them. If you have problems or questions, call your caregiver or physical therapist for advice.   Rehabilitation is important following a joint replacement. After just a few days of immobilization, the muscles of the leg can become weakened and shrink (atrophy).  These exercises are designed to build up the tone and strength of the thigh and leg muscles and to improve motion. Often times heat used for twenty to thirty minutes before working out will loosen up your tissues and help with improving the range of motion but do not use heat for the first two weeks following surgery (sometimes heat can increase post-operative swelling).   These exercises can be done on a training (exercise)  mat, on the floor, on a table or on a bed. Use whatever works the best and is most comfortable for you.    Use music or television while you are exercising so that the exercises are a pleasant break in your day. This will make your life better with the exercises acting as a break in your routine that you can look forward to.   Perform all exercises about fifteen times, three times per day or as directed.  You should exercise both the operative leg and the other leg as well.  Exercises include:   Quad Sets - Tighten up the muscle on the front of the thigh (Quad) and hold for 5-10 seconds.   Straight Leg Raises - With your knee straight (if you were given a brace, keep it on), lift the leg to 60 degrees, hold for 3 seconds, and slowly lower the leg.  Perform this exercise against resistance later as your leg gets stronger.  Leg Slides: Lying on your back, slowly slide your foot toward your buttocks, bending your knee up off the floor (only go as far as is comfortable). Then slowly slide your foot back down until your leg is flat on the floor again.  Angel Wings: Lying on your back spread your legs to the side as far apart as you can without causing discomfort.  Hamstring Strength:  Lying on your back, push your heel against the floor with your leg straight by tightening up  the muscles of your buttocks.  Repeat, but this time bend your knee to a comfortable angle, and push your heel against the floor.  You may put a pillow under the heel to make it more comfortable if necessary.   A rehabilitation program following joint replacement surgery can speed recovery and prevent re-injury in the future due to weakened muscles. Contact your doctor or a physical therapist for more information on knee rehabilitation.    CONSTIPATION  Constipation is defined medically as fewer than three stools per week and severe constipation as less than one stool per week.  Even if you have a regular bowel pattern at home, your  normal regimen is likely to be disrupted due to multiple reasons following surgery.  Combination of anesthesia, postoperative narcotics, change in appetite and fluid intake all can affect your bowels.   YOU MUST use at least one of the following options; they are listed in order of increasing strength to get the job done.  They are all available over the counter, and you may need to use some, POSSIBLY even all of these options:    Drink plenty of fluids (prune juice may be helpful) and high fiber foods Colace 100 mg by mouth twice a day  Senokot for constipation as directed and as needed Dulcolax (bisacodyl), take with full glass of water  Miralax (polyethylene glycol) once or twice a day as needed.  If you have tried all these things and are unable to have a bowel movement in the first 3-4 days after surgery call either your surgeon or your primary doctor.    If you experience loose stools or diarrhea, hold the medications until you stool forms back up.  If your symptoms do not get better within 1 week or if they get worse, check with your doctor.  If you experience "the worst abdominal pain ever" or develop nausea or vomiting, please contact the office immediately for further recommendations for treatment.   ITCHING:  If you experience itching with your medications, try taking only a single pain pill, or even half a pain pill at a time.  You can also use Benadryl over the counter for itching or also to help with sleep.   TED HOSE STOCKINGS:  Use stockings on both legs until for at least 2 weeks or as directed by physician office. They may be removed at night for sleeping.  MEDICATIONS:  See your medication summary on the "After Visit Summary" that nursing will review with you.  You may have some home medications which will be placed on hold until you complete the course of blood thinner medication.  It is important for you to complete the blood thinner medication as prescribed.  PRECAUTIONS:   If you experience chest pain or shortness of breath - call 911 immediately for transfer to the hospital emergency department.   If you develop a fever greater that 101 F, purulent drainage from wound, increased redness or drainage from wound, foul odor from the wound/dressing, or calf pain - CONTACT YOUR SURGEON.                                                   FOLLOW-UP APPOINTMENTS:  If you do not already have a post-op appointment, please call the office for an appointment to be seen by your surgeon.  Guidelines for  how soon to be seen are listed in your "After Visit Summary", but are typically between 1-4 weeks after surgery.  OTHER INSTRUCTIONS:   Knee Replacement:  Do not place pillow under knee, focus on keeping the knee straight while resting. CPM instructions: 0-90 degrees, 2 hours in the morning, 2 hours in the afternoon, and 2 hours in the evening. Place foam block, curve side up under heel at all times except when in CPM or when walking.  DO NOT modify, tear, cut, or change the foam block in any way.  POST-OPERATIVE OPIOID TAPER INSTRUCTIONS: It is important to wean off of your opioid medication as soon as possible. If you do not need pain medication after your surgery it is ok to stop day one. Opioids include: Codeine, Hydrocodone(Norco, Vicodin), Oxycodone(Percocet, oxycontin) and hydromorphone amongst others.  Long term and even short term use of opiods can cause: Increased pain response Dependence Constipation Depression Respiratory depression And more.  Withdrawal symptoms can include Flu like symptoms Nausea, vomiting And more Techniques to manage these symptoms Hydrate well Eat regular healthy meals Stay active Use relaxation techniques(deep breathing, meditating, yoga) Do Not substitute Alcohol to help with tapering If you have been on opioids for less than two weeks and do not have pain than it is ok to stop all together.  Plan to wean off of opioids This plan  should start within one week post op of your joint replacement. Maintain the same interval or time between taking each dose and first decrease the dose.  Cut the total daily intake of opioids by one tablet each day Next start to increase the time between doses. The last dose that should be eliminated is the evening dose.     MAKE SURE YOU:  Understand these instructions.  Get help right away if you are not doing well or get worse.    Thank you for letting us be a part of your medical care team.  It is a privilege we respect greatly.  We hope these instructions will help you stay on track for a fast and full recovery!   Increase activity slowly as tolerated   Complete by: As directed    Increase activity slowly as tolerated   Complete by: As directed    Post-operative opioid taper instructions:   Complete by: As directed    POST-OPERATIVE OPIOID TAPER INSTRUCTIONS: It is important to wean off of your opioid medication as soon as possible. If you do not need pain medication after your surgery it is ok to stop day one. Opioids include: Codeine, Hydrocodone(Norco, Vicodin), Oxycodone(Percocet, oxycontin) and hydromorphone amongst others.  Long term and even short term use of opiods can cause: Increased pain response Dependence Constipation Depression Respiratory depression And more.  Withdrawal symptoms can include Flu like symptoms Nausea, vomiting And more Techniques to manage these symptoms Hydrate well Eat regular healthy meals Stay active Use relaxation techniques(deep breathing, meditating, yoga) Do Not substitute Alcohol to help with tapering If you have been on opioids for less than two weeks and do not have pain than it is ok to stop all together.  Plan to wean off of opioids This plan should start within one week post op of your joint replacement. Maintain the same interval or time between taking each dose and first decrease the dose.  Cut the total daily intake of  opioids by one tablet each day Next start to increase the time between doses. The last dose that should be eliminated is the  evening dose.      Post-operative opioid taper instructions:   Complete by: As directed    POST-OPERATIVE OPIOID TAPER INSTRUCTIONS: It is important to wean off of your opioid medication as soon as possible. If you do not need pain medication after your surgery it is ok to stop day one. Opioids include: Codeine, Hydrocodone(Norco, Vicodin), Oxycodone(Percocet, oxycontin) and hydromorphone amongst others.  Long term and even short term use of opiods can cause: Increased pain response Dependence Constipation Depression Respiratory depression And more.  Withdrawal symptoms can include Flu like symptoms Nausea, vomiting And more Techniques to manage these symptoms Hydrate well Eat regular healthy meals Stay active Use relaxation techniques(deep breathing, meditating, yoga) Do Not substitute Alcohol to help with tapering If you have been on opioids for less than two weeks and do not have pain than it is ok to stop all together.  Plan to wean off of opioids This plan should start within one week post op of your joint replacement. Maintain the same interval or time between taking each dose and first decrease the dose.  Cut the total daily intake of opioids by one tablet each day Next start to increase the time between doses. The last dose that should be eliminated is the evening dose.           Follow-up Information     Melrose Nakayama, MD. Go on 04/28/2021.   Specialty: Orthopedic Surgery Why: Your appointment is scheduled for 9:00 Contact information: [place] [id number] [phone]         [facility] Go on 04/20/2021.   Why: your appointment is scheduled for 11:00. Please arrive at 10:30 to complete paperwork Contact information: Physical Therapy [address] [place]  [id number] [phone]                  Signed: Larwance Sachs Amias Hutchinson 04/19/2021, 7:46 AM

## 2021-04-19 NOTE — Progress Notes (Signed)
Physical Therapy Treatment Patient Details Name: Eric Simmons. MRN: [id number] DOB: [birth year] Today's Date: 04/19/2021    History of Present Illness Patient is 80 y.o. male s/p Lt THA anterior approacho n 04/18/21 with PMH significant for OA, skin cancer.    PT Comments    Patient progressing well with acute PT and able to ambulate  ~300' and complete stair mobility this AM. Patient's wife provided safe guarding during stairs and gait with cues and supervision from therapist. Initiated and completed supine/seated HEP exercises and addressed all questions for pt/family. Patient is mobilizing at safe level for discharge home with assist from family.     Follow Up Recommendations  Follow surgeon's recommendation for DC plan and follow-up therapies;Home health PT     Equipment Recommendations  None recommended by PT    Recommendations for Other Services       Precautions / Restrictions Precautions Precautions: Fall Restrictions Weight Bearing Restrictions: No Other Position/Activity Restrictions: WBAT    Mobility  Bed Mobility               General bed mobility comments: pt OOB in recliner.    Transfers Overall transfer level: Needs assistance Equipment used: Rolling walker (2 wheeled) Transfers: Sit to/from Stand Sit to Stand: Min guard;Supervision         General transfer comment: pt using bil UE for power up from recliner, cues for safety to rise slow/controlled. cues for safe reach back to sit.  Ambulation/Gait Ambulation/Gait assistance: Min guard;Supervision Gait Distance (Feet): 300 Feet Assistive device: Rolling walker (2 wheeled) Gait Pattern/deviations: Decreased stride length;Step-through pattern Gait velocity: decr   General Gait Details: Cues for proximity to RW and pt maintained throughout, occasional cues needed to keep RW on floor and roll it. Pt's wife provided safe guarding during gait with supervision and instruction from therapist. no  overt LOB noted.   Stairs Stairs: Yes Stairs assistance: Min guard;Supervision;Min assist Stair Management: No rails;Step to pattern;Forwards;With walker Number of Stairs: 6 (2x3) General stair comments: cues for step sequencing, up with good, down with bad. Cues and supervision for pt's wife to provide safe guarding position throughout. pt complete stairs 2x.   Wheelchair Mobility    Modified Rankin (Stroke Patients Only)       Balance Overall balance assessment: Needs assistance Sitting-balance support: Feet supported Sitting balance-Leahy Scale: Good     Standing balance support: During functional activity;Bilateral upper extremity supported Standing balance-Leahy Scale: Fair                              Cognition Arousal/Alertness: Awake/alert Behavior During Therapy: WFL for tasks assessed/performed Overall Cognitive Status: Within Functional Limits for tasks assessed                                        Exercises Total Joint Exercises Ankle Circles/Pumps: AROM;Both;20 reps;Seated    General Comments        Pertinent Vitals/Pain Pain Assessment: Faces Faces Pain Scale: Hurts little more Pain Location: Lt hip Pain Descriptors / Indicators: Aching;Discomfort Pain Intervention(s): Limited activity within patient's tolerance;Monitored during session    Home Living                      Prior Function            PT Goals (current  goals can now be found in the care plan section) Acute Rehab PT Goals Patient Stated Goal: recover and get back to walking 17 laps (1.5 miles) around the track at the [facility] PT Goal Formulation: With patient Time For Goal Achievement: 04/25/21 Potential to Achieve Goals: Good Progress towards PT goals: Progressing toward goals    Frequency    7X/week      PT Plan Current plan remains appropriate    Co-evaluation              AM-PAC PT "6 Clicks" Mobility   Outcome  Measure  Help needed turning from your back to your side while in a flat bed without using bedrails?: A Little Help needed moving from lying on your back to sitting on the side of a flat bed without using bedrails?: A Little Help needed moving to and from a bed to a chair (including a wheelchair)?: A Little Help needed standing up from a chair using your arms (e.g., wheelchair or bedside chair)?: A Little Help needed to walk in hospital room?: A Little Help needed climbing 3-5 steps with a railing? : A Lot 6 Click Score: 17    End of Session Equipment Utilized During Treatment: Gait belt Activity Tolerance: Patient tolerated treatment well Patient left: in chair;with call bell/phone within reach;with chair alarm set;with family/visitor present Nurse Communication: Mobility status PT Visit Diagnosis: Muscle weakness (generalized) (M62.81);Difficulty in walking, not elsewhere classified (R26.2)     Time: 1093-2355 PT Time Calculation (min) (ACUTE ONLY): 32 min  Charges:  $Gait Training: 8-22 mins $Therapeutic Exercise: 8-22 mins                     [name], DPT Acute Rehabilitation Services Office [phone] Pager [phone]    [name] 04/19/2021, 12:53 PM

## 2021-04-19 NOTE — Progress Notes (Signed)
Subjective: 1 Day Post-Op Procedure(s) (LRB): LEFT TOTAL HIP ARTHROPLASTY ANTERIOR APPROACH (Left)  Patient is feelign well and is looking forward to going home today.  Activity level:  wbat Diet tolerance:  ok Voiding:  ok Patient reports pain as mild.    Objective: Vital signs in last 24 hours: Temp:  [97.7 F (36.5 C)-98.2 F (36.8 C)] 98.1 F (36.7 C) (06/22 0317) Pulse Rate:  [46-89] 76 (06/22 0317) Resp:  [10-20] 18 (06/22 0317) BP: (121-200)/(56-107) 124/56 (06/22 0317) SpO2:  [97 %-100 %] 97 % (06/22 0317)  Labs: No results for input(s): HGB in the last 72 hours. No results for input(s): WBC, RBC, HCT, PLT in the last 72 hours. No results for input(s): NA, K, CL, CO2, BUN, CREATININE, GLUCOSE, CALCIUM in the last 72 hours. No results for input(s): LABPT, INR in the last 72 hours.  Physical Exam:  Neurologically intact ABD soft Neurovascular intact Sensation intact distally Intact pulses distally Dorsiflexion/Plantar flexion intact Incision: dressing C/D/I and no drainage No cellulitis present Compartment soft  Assessment/Plan:  1 Day Post-Op Procedure(s) (LRB): LEFT TOTAL HIP ARTHROPLASTY ANTERIOR APPROACH (Left) Advance diet Up with therapy D/C IV fluids Discharge home with home health today after PT. Follow up in office 2 weeks post op. Continue on 81mg  asa BID for DVT prevention.  Larwance Sachs Jaylee Freeze 04/19/2021, 7:43 AM

## 2021-04-19 NOTE — Plan of Care (Signed)
Pt ready to DC home with wife. 

## 2021-04-20 ENCOUNTER — Emergency Department (HOSPITAL_COMMUNITY): Payer: PPO

## 2021-04-20 ENCOUNTER — Other Ambulatory Visit: Payer: Self-pay | Admitting: Physician Assistant

## 2021-04-20 ENCOUNTER — Emergency Department (HOSPITAL_COMMUNITY)
Admission: EM | Admit: 2021-04-20 | Discharge: 2021-04-20 | Disposition: A | Discharge status: Home / Self Care | Payer: PPO | Attending: Emergency Medicine | Admitting: Emergency Medicine

## 2021-04-20 ENCOUNTER — Other Ambulatory Visit: Payer: Self-pay

## 2021-04-20 ENCOUNTER — Encounter (HOSPITAL_COMMUNITY): Payer: Self-pay | Admitting: *Deleted

## 2021-04-20 DIAGNOSIS — Z7982 Long term (current) use of aspirin: Secondary | ICD-10-CM | POA: Diagnosis not present

## 2021-04-20 DIAGNOSIS — Z87891 Personal history of nicotine dependence: Secondary | ICD-10-CM | POA: Insufficient documentation

## 2021-04-20 DIAGNOSIS — Z79899 Other long term (current) drug therapy: Secondary | ICD-10-CM | POA: Insufficient documentation

## 2021-04-20 DIAGNOSIS — R55 Syncope and collapse: Secondary | ICD-10-CM

## 2021-04-20 DIAGNOSIS — Z96642 Presence of left artificial hip joint: Secondary | ICD-10-CM | POA: Insufficient documentation

## 2021-04-20 DIAGNOSIS — R61 Generalized hyperhidrosis: Secondary | ICD-10-CM | POA: Diagnosis not present

## 2021-04-20 DIAGNOSIS — I10 Essential (primary) hypertension: Secondary | ICD-10-CM | POA: Diagnosis not present

## 2021-04-20 DIAGNOSIS — I447 Left bundle-branch block, unspecified: Secondary | ICD-10-CM | POA: Diagnosis not present

## 2021-04-20 DIAGNOSIS — D72829 Elevated white blood cell count, unspecified: Secondary | ICD-10-CM | POA: Diagnosis not present

## 2021-04-20 DIAGNOSIS — I517 Cardiomegaly: Secondary | ICD-10-CM | POA: Diagnosis not present

## 2021-04-20 DIAGNOSIS — R911 Solitary pulmonary nodule: Secondary | ICD-10-CM | POA: Diagnosis not present

## 2021-04-20 DIAGNOSIS — J9811 Atelectasis: Secondary | ICD-10-CM | POA: Diagnosis not present

## 2021-04-20 DIAGNOSIS — E871 Hypo-osmolality and hyponatremia: Secondary | ICD-10-CM | POA: Diagnosis not present

## 2021-04-20 DIAGNOSIS — Z85828 Personal history of other malignant neoplasm of skin: Secondary | ICD-10-CM | POA: Diagnosis not present

## 2021-04-20 LAB — URINALYSIS, ROUTINE W REFLEX MICROSCOPIC
Bacteria, UA: NONE SEEN
Bilirubin Urine: NEGATIVE
Glucose, UA: NEGATIVE mg/dL
Ketones, ur: 5 mg/dL — AB
Leukocytes,Ua: NEGATIVE
Nitrite: NEGATIVE
Protein, ur: NEGATIVE mg/dL
Specific Gravity, Urine: 1.012 (ref 1.005–1.030)
pH: 7 (ref 5.0–8.0)

## 2021-04-20 LAB — TROPONIN I (HIGH SENSITIVITY)
Troponin I (High Sensitivity): 20 ng/L — ABNORMAL HIGH (ref <18)
Troponin I (High Sensitivity): 16 ng/L (ref <18)

## 2021-04-20 LAB — BASIC METABOLIC PANEL
Anion gap: 7 (ref 5–15)
BUN: 15 mg/dL (ref 8–23)
CO2: 25 mmol/L (ref 22–32)
Calcium: 9 mg/dL (ref 8.9–10.3)
Chloride: 99 mmol/L (ref 98–111)
Creatinine, Ser: 1.05 mg/dL (ref 0.61–1.24)
GFR, Estimated: >60 mL/min (ref >60)
Glucose, Bld: 159 mg/dL — ABNORMAL HIGH (ref 70–99)
Potassium: 4.2 mmol/L (ref 3.5–5.1)
Sodium: 131 mmol/L — ABNORMAL LOW (ref 135–145)

## 2021-04-20 LAB — CBC
HCT: 36.3 % — ABNORMAL LOW (ref 39.0–52.0)
Hemoglobin: 11.8 g/dL — ABNORMAL LOW (ref 13.0–17.0)
MCH: 30 pg (ref 26.0–34.0)
MCHC: 32.5 g/dL (ref 30.0–36.0)
MCV: 92.4 fL (ref 80.0–100.0)
Platelets: 194 10*3/uL (ref 150–400)
RBC: 3.93 MIL/uL — ABNORMAL LOW (ref 4.22–5.81)
RDW: 13.1 % (ref 11.5–15.5)
WBC: 11.6 10*3/uL — ABNORMAL HIGH (ref 4.0–10.5)
nRBC: 0 % (ref 0.0–0.2)

## 2021-04-20 LAB — CBG MONITORING, ED: Glucose-Capillary: 148 mg/dL — ABNORMAL HIGH (ref 70–99)

## 2021-04-20 MED ORDER — IOHEXOL 350 MG/ML SOLN
100.0000 mL | Freq: Once | INTRAVENOUS | Status: AC
  Administered 2021-04-20: 100 mL via INTRAVENOUS

## 2021-04-20 MED ORDER — SODIUM CHLORIDE 0.9 % IV BOLUS
500.0000 mL | Freq: Once | INTRAVENOUS | Status: AC
  Administered 2021-04-20: 500 mL via INTRAVENOUS

## 2021-04-20 NOTE — ED Notes (Signed)
Pt orthostatic vitals done this morning when order was clicked off, vitals did not cross over to chart. Pt was negative. Pt orthostatic vitals around lying was 145/74, sitting 138/72, standing 156/75 (around). Pt reported no dizziness or lightheadedness during orthostatics but did report pain in hip

## 2021-04-20 NOTE — ED Provider Notes (Signed)
[facility] EMERGENCY DEPARTMENT Provider Note   CSN: [id number] Arrival date & time: 04/20/21  5366     History Chief Complaint  Patient presents with   Loss of Consciousness    2 days post op    Iverson Sees. is a 80 y.o. male.  80 y.o male with a PMH of BCC, presents to the ED via EMS s/p syncopal episodes x this morning. Patient was sitting down at the table before breakfast when he became dizzy and fell out for 20 seconds, this was witnessed by family.  He reports he did not collapse to the ground, as wife and grandson were helping him stay up.  He reports he had not eaten anything for breakfast.  He did have a left hip replacement by Dr. Rhona Raider for approximately 2 days ago, reports he was discharged from the hospital after 1 day of admission.  He endorses pain along the left hip, describing this as "soreness about a 2 out of 10.  EMS obtain EKG noted for left bundle branch block, he does not recall any prior history of this.  Did not receive any medication by EMS.  On arrival, he reports no pain and no complaints aside from left hip soreness.  He denies any chest pain, shortness of breath, prior history of blood clots.  Currently not any blood thinners.  The history is provided by the patient.  Loss of Consciousness Episode history:  Single Most recent episode:  Today Duration:  20 seconds Timing:  Rare Witnessed: yes   Worsened by:  Nothing Ineffective treatments:  None tried Associated symptoms: dizziness   Associated symptoms: no chest pain, no fever, no nausea, no shortness of breath and no vomiting   Risk factors: no congenital heart disease, no coronary artery disease, no seizures and no vascular disease       Past Medical History:  Diagnosis Date   Adenomatous colon polyp 03/2005   tubular adenoma   BCC (basal cell carcinoma), face    left face, R ear (04/2014), nose 05/2016   Benign familial tremor 08/29/2012   Erectile dysfunction     Hemorrhoids    internal and external   Hip arthritis    left   IFG (impaired fasting glucose) 12/10/2012   Glucose 111 11/2012    Insomnia    Mild neurocognitive disorder 09/11/2019   Pseudogout of knee 07/2003   left   Pure hypercholesterolemia     Patient Active Problem List   Diagnosis Date Noted   Primary osteoarthritis of left hip 04/18/2021   Mild neurocognitive disorder 09/11/2019   Dysphagia 12/28/2015   Erectile dysfunction 12/07/2013   Insomnia 12/15/2012   IFG (impaired fasting glucose) 12/10/2012   Benign familial tremor 08/29/2012   Pure hypercholesterolemia 12/13/2011    Past Surgical History:  Procedure Laterality Date   COLONOSCOPY  6/06, 05/2010   Dr. [name]   MOHS SURGERY  6/07   L face, Dr. Link Snuffer   POLYPECTOMY     TONSILLECTOMY     TOTAL HIP ARTHROPLASTY Left 04/18/2021   Procedure: LEFT TOTAL HIP ARTHROPLASTY ANTERIOR APPROACH;  Surgeon: Melrose Nakayama, MD;  Location: [facility];  Service: Orthopedics;  Laterality: Left;   VASECTOMY         Family History  Problem Relation Age of Onset   Cancer Mother 55       colon cancer   Diabetes Mother    Colon cancer Mother    Colon polyps Mother  Heart disease Father        MI   Hypertension Father    Hyperlipidemia Brother    Heart disease Brother        septal defect   Tremor Brother    Hyperlipidemia Brother    Tremor Brother    Hyperlipidemia Brother    Hyperlipidemia Son    Heart disease Son        pacemaker (had syncope, early 2022)   Syncope episode Son        r/b pacemaker   Hyperlipidemia Son    Sleep apnea Son    Esophageal cancer Neg Hx    Stomach cancer Neg Hx    Rectal cancer Neg Hx     Social History   Tobacco Use   Smoking status: Former    Pack years: 0.00    Types: Cigars   Smokeless tobacco: Never   Tobacco comments:    as a teenager  Vaping Use   Vaping Use: Never used  Substance Use Topics   Alcohol use: Yes    Alcohol/week: 0.0 standard drinks     Comment: 5-6 ounces of Carlino wine or beer 6-7 x/week    Drug use: No    Home Medications Prior to Admission medications   Medication Sig Start Date End Date Taking? Authorizing Provider  acetaminophen (TYLENOL) 650 MG CR tablet Take 650 mg by mouth every 8 (eight) hours as needed for pain.    [provider]  amLODipine (NORVASC) 5 MG tablet Take 1 tablet (5 mg total) by mouth daily. 04/14/21   [name], MD  aspirin EC 81 MG tablet Take 1 tablet (81 mg total) by mouth 2 (two) times daily after a meal. Swallow whole. 04/18/21 04/18/22  Loni Dolly, PA-C  cholecalciferol (VITAMIN D) 1000 units tablet Take 1,000 Units by mouth daily.    [provider]  Co-Enzyme Q-10 100 MG CAPS Take 100 mg by mouth daily.    [provider]  Cyanocobalamin (VITAMIN B 12 PO) Take 1,000 mcg by mouth daily.    [provider]  finasteride (PROSCAR) 5 MG tablet Take 5 mg by mouth daily. 09/16/20   [provider]  Glucosamine-Chondroit-Vit C-Mn (GLUCOSAMINE 1500 COMPLEX) CAPS Take 1 capsule by mouth daily.    [provider]  HYDROcodone-acetaminophen (NORCO/VICODIN) 5-325 MG tablet Take 1-2 tablets by mouth every 6 (six) hours as needed for moderate pain or severe pain (post op pain). 04/18/21 04/18/22  Loni Dolly, PA-C  losartan (COZAAR) 50 MG tablet Take 1 tablet (50 mg total) by mouth daily. 03/28/21   [name], MD  metoprolol succinate (TOPROL-XL) 25 MG 24 hr tablet TAKE ONE TABLET DAILY 04/07/21   [name], MD  primidone (MYSOLINE) 50 MG tablet TAKE ONE TABLET AT BEDTIME Patient taking differently: Take 50 mg by mouth at bedtime. 01/26/21   Tat, Eustace Quail, DO  rosuvastatin (CRESTOR) 20 MG tablet Take 1 tablet (20 mg total) by mouth daily. 04/14/21   [name], MD  sildenafil (VIAGRA) 100 MG tablet Take 100 mg by mouth daily as needed for erectile dysfunction. 03/15/20   [provider]  tiZANidine (ZANAFLEX) 4 MG tablet Take 1  tablet (4 mg total) by mouth every 6 (six) hours as needed for muscle spasms. 04/18/21 04/18/22  Loni Dolly, PA-C    Allergies    Codeine  Review of Systems   Review of Systems  Constitutional:  Negative for fever.  HENT:  Negative for sore throat.   Respiratory:  Negative for shortness of breath.   Cardiovascular:  Positive for syncope. Negative for chest pain.  Gastrointestinal:  Negative for abdominal pain, nausea and vomiting.  Musculoskeletal:  Negative for back pain.  Skin:  Positive for wound.  Neurological:  Positive for dizziness and syncope.  All other systems reviewed and are negative.  Physical Exam Updated Vital Signs BP (!) 148/76 (BP Location: Left Arm)   Pulse 88   Temp 98.5 F (36.9 C) (Oral)   Resp 18   SpO2 100%   Physical Exam Vitals and nursing note reviewed.  Constitutional:      Appearance: Normal appearance.  HENT:     Head: Normocephalic and atraumatic.     Nose: Nose normal.     Mouth/Throat:     Mouth: Mucous membranes are moist.  Eyes:     Pupils: Pupils are equal, round, and reactive to light.  Cardiovascular:     Rate and Rhythm: Normal rate.  Pulmonary:     Effort: Pulmonary effort is normal.     Breath sounds: No wheezing or rales.     Comments: Lungs are cleared to auscultation.  Abdominal:     General: Abdomen is flat.     Tenderness: There is no abdominal tenderness. There is no right CVA tenderness or left CVA tenderness.  Musculoskeletal:        General: Tenderness present.     Cervical back: Normal range of motion and neck supple.     Left hip: Tenderness present. No deformity, lacerations or crepitus. Decreased strength.     Comments: Bruising noted to left hip. No drainage from the wound, pad applied to the area. Decreased strength due to pain.   Skin:    General: Skin is warm and dry.  Neurological:     Mental Status: He is alert and oriented to person, place, and time.    ED Results / Procedures / Treatments    Labs (all labs ordered are listed, but only abnormal results are displayed) Labs Reviewed  BASIC METABOLIC PANEL - Abnormal; Notable for the following components:      Result Value   Sodium 131 (*)    Glucose, Bld 159 (*)    All other components within normal limits  CBC - Abnormal; Notable for the following components:   WBC 11.6 (*)    RBC 3.93 (*)    Hemoglobin 11.8 (*)    HCT 36.3 (*)    All other components within normal limits  URINALYSIS, ROUTINE W REFLEX MICROSCOPIC - Abnormal; Notable for the following components:   Color, Urine STRAW (*)    Hgb urine dipstick SMALL (*)    Ketones, ur 5 (*)    All other components within normal limits  CBG MONITORING, ED - Abnormal; Notable for the following components:   Glucose-Capillary 148 (*)    All other components within normal limits  TROPONIN I (HIGH SENSITIVITY) - Abnormal; Notable for the following components:   Troponin I (High Sensitivity) 20 (*)    All other components within normal limits  TROPONIN I (HIGH SENSITIVITY)    EKG EKG Interpretation  Date/Time:  Thursday April 20 2021 08:53:53 EDT Ventricular Rate:  78 PR Interval:  188 QRS Duration: 124 QT Interval:  368 QTC Calculation: 419 R Axis:   -17 Text Interpretation: Normal sinus rhythm Left ventricular hypertrophy with QRS widening and repolarization abnormality ( Cornell product ) No previous ECGs available Confirmed by Gareth Morgan (  [id number]) on 04/20/2021 9:43:13 AM  Radiology DG Chest 2 View  Result Date: 04/20/2021 CLINICAL DATA:  Syncope. EXAM: CHEST - 2 VIEW COMPARISON:  04/11/2021. FINDINGS: Mediastinum hilar structures normal. Borderline cardiomegaly. No pulmonary venous congestion. Low lung volumes with mild left base subsegmental atelectasis. No focal infiltrate. No pleural effusion or pneumothorax. Degenerative change thoracic spine. IMPRESSION: 1.  Borderline cardiomegaly.  No pulmonary venous congestion. 2.  Low lung volumes with mild left base  subsegmental atelectasis. Electronically Signed   By: Marcello Moores  Register   On: 04/20/2021 10:01   CT Angio Chest PE W and/or Wo Contrast  Result Date: 04/20/2021 CLINICAL DATA:  Syncope. Hip surgery 3 days ago. Clinical concern for pulmonary embolus. EXAM: CT ANGIOGRAPHY CHEST WITH CONTRAST TECHNIQUE: Multidetector CT imaging of the chest was performed using the standard protocol during bolus administration of intravenous contrast. Multiplanar CT image reconstructions and MIPs were obtained to evaluate the vascular anatomy. CONTRAST:  138mL OMNIPAQUE IOHEXOL 350 MG/ML SOLN COMPARISON:  No comparison studies available. FINDINGS: Cardiovascular: The heart size is normal. No substantial pericardial effusion. Coronary artery calcification is evident. Atherosclerotic calcification is noted in the wall of the thoracic aorta. There is no filling defect within the opacified pulmonary arteries to suggest the presence of an acute pulmonary embolus. Mediastinum/Nodes: No mediastinal lymphadenopathy. There is no hilar lymphadenopathy. The esophagus has normal imaging features. There is no axillary lymphadenopathy. Lungs/Pleura: 2 mm nodule identified left lower lobe on image 113/6. No suspicious pulmonary nodule or mass. No focal airspace consolidation. No pleural effusion. Upper Abdomen: Unremarkable. Musculoskeletal: No worrisome lytic or sclerotic osseous abnormality. Review of the MIP images confirms the above findings. IMPRESSION: 1. No CT evidence for acute pulmonary embolus. 2. 2 mm left lower lobe pulmonary nodule. No follow-up needed if patient is low-risk. Non-contrast chest CT can be considered in 12 months if patient is high-risk. This recommendation follows the consensus statement: Guidelines for Management of Incidental Pulmonary Nodules Detected on CT Images: From the Fleischner Society 2017; Radiology 2017; 284:228-243. 3. Aortic Atherosclerosis (ICD10-I70.0). Electronically Signed   By: Misty Stanley M.D.    On: 04/20/2021 13:56    Procedures Procedures   Medications Ordered in ED Medications  sodium chloride 0.9 % bolus 500 mL (500 mLs Intravenous Bolus 04/20/21 1438)  iohexol (OMNIPAQUE) 350 MG/ML injection 100 mL (100 mLs Intravenous Contrast Given 04/20/21 1327)    ED Course  I have reviewed the triage vital signs and the nursing notes.  Pertinent labs & imaging results that were available during my care of the patient were reviewed by me and considered in my medical decision making (see chart for details).  Clinical Course as of 04/20/21 1530  Thu Apr 20, 2021  0942 Glucose-Capillary(!): 148 [JS]    Clinical Course User Index [JS] Janeece Fitting, PA-C   MDM Rules/Calculators/A&P  Patient presents to the ED s/p syncope via EMS.  Reports he was at the breakfast table prior to eating any food today, he felt somewhat dizzy for about 20 seconds and syncopized.  According to his wife and grandson who were at the table, they held him upright in order for him not to collapse.  He did have a procedure such as left hip replacement by Dr. Nolon Bussing for 2 days ago, reports he was sent home after 1 overnight admission.  There is no complaints on today's visit such as chest pain, shortness of breath.  Does endorse soreness to the left hip.  On arrival vitals are within normal limits,  he is normotensive, no tachycardia or hypoxia.  EKG Normal sinus rhythm.  The rotation of his blood work reveal a BMP with mild hyponatremia.  Creatinine levels within normal limits.  CBC is with a slight leukocytosis, however he is currently 2 days postop.  Hemoglobin slightly decreased, again postop.  First troponin was 20, this was delta and remained flat now at 16.  He does not have any prior history of CAD, no family history of CAD, however due to age and risk factors I feel that a cardiology consult is appropriate at this time with LBBB and no prior for comparison.   [facility] rule patient is not low risk for serious  outcome.  CT Angio chest:  1. No CT evidence for acute pulmonary embolus.  2. 2 mm left lower lobe pulmonary nodule. No follow-up needed if  patient is low-risk. Non-contrast chest CT can be considered in 12  months if patient is high-risk. This recommendation follows the  consensus statement: Guidelines for Management of Incidental  Pulmonary Nodules Detected on CT Images: From the Fleischner Society  2017; Radiology 2017; 284:228-243.  3. Aortic Atherosclerosis (ICD10-I70.0).      9:28 AM Ortho APP consulted, as patient will be missing his appointment this afternoon for postop check.  Stable from orthopedic standpoint, can follow-up outpatient at next available.  11:35 AM Spoke to cardiology, who will evaluate patient while in the ED.  2:50 PM Spoke to Dr. Radford Pax from cardiology who requested orthostatic vitals.  These had been ordered and are 9 AM, according to her nurse technician, these were lost.  However, they were negative as she recalls. He has since received a 500 mL bolus.  I discussed case with my attending Dr. Billy Fischer.  Shared decision conversation who agreeable of outpatient follow up. He is asymptomatic, and would like to be discharged home at this time. Vitals remain stable. Patient disposition home with close cardiology follow up.   Portions of this note were generated with [employer]. Dictation errors may occur despite best attempts at proofreading.  Final Clinical Impression(s) / ED Diagnoses Final diagnoses:  Syncope and collapse    Rx / DC Orders ED Discharge Orders          Ordered    Ambulatory referral to Cardiology        04/20/21 1524             Janeece Fitting, Hershal Coria 04/20/21 1530    Gareth Morgan, MD 04/22/21 [phone]

## 2021-04-20 NOTE — Consult Note (Addendum)
Cardiology Consultation:   Patient ID: Eric Simmons. MRN: [id number]; DOB: [birth year]  Admit date: 04/20/2021 Date of Consult: 04/20/2021  PCP:  [name], MD   [place] Prov[facility] Cardiologist:  [name], MD        Patient Profile:   Eric Simmons. is a 80 y.o. male with a hx of HTN, HLD, family history of CAD (Father with MI age 29), mild cognitive impairment, essential tremor, ED, arthritis who is being seen 04/20/2021 for the evaluation of syncope at the request of [facility] PA-C.  History of Present Illness:   Eric Simmons previously established care with Eric Simmons in 2021 for DOE and chest discomfort. Nuclear stress test 03/2020 showed reduced counts in the mid inferior segment on rest and stress imaging consistent with diaphragm attenuation, normal study without evidence of ischemia or infarction, EF 56%. 2D Echo 03/2020 showed EF 60-65%, mild MR. Metoprolol was added with resolution in symptoms. His symptoms were felt related to poorly controlled HTN and diastolic dysfunction at the time.  He was seen in the HTN clinic recently for continued follow-up of HTN at which time he remained hypertensive so amlodipine was added. Simvastatin changed to rosuvastatin due to interaction with amlodipine. He had not started the amlodpine yet at home though per patient and wife. He underwent L total hip arthroplasty on 04/18/21. During admission BP was high. Does appear he got his amlodipine, losartan and metoprolol while admitted, with discharge BP 124/56. He went home with pain medication as well.  The patient defers to his wife for majority of the history. He was in his what he considered an expected state of recovery this morning around 7am when he was sitting down to have breakfast. He was experiencing significant hip pain and had not yet taken his AM dose of pain medicine. His wife had checked his BP in one arm and observed it to be in the 120s "which is low for him" and then  rechecked it and it was in the 90s. She is not sure if their cuff is accurate. He had not yet taken any medicines. (They were planning to take his usual meds, including the new amlodipine they picked up.) Shortly after she observed him to have gagged/coughed as then he slumped over/passed out to one side. No B/B incontinence or seizure activity. He was out for max 20 seconds per wife - states it was all very quick. Their daughter held him up and he came to nearly fully awake without any sequelae. Denies any preceding or post-event CP, palpitations, dizziness. EMS was summoned. CBG reported to be 129, EKG felt to have LBBB (has LVH with QRS widening chronically). EMS vital signs are not available at this time. Orthostatics were performed earlier but not crossing over into chart - BP was negative per nurse tech note with BP lying 145/74, sitting 138/72, standing 156/75, HR range not available but no symptoms at that time. He is in hallway bed so no telemetry available at this time.  Labs show mild leukocytosis 11.6, Hgb 11.8, Plt 194, hsTroponin 20-16, hyponatremia of 131, K 4.2. Labs show BP 140s/70s, normal O2 sat, HR 80s (NSR). CXR with borderline cardiomegaly with no pulmonary vascular congestion, low lung volumes with mild left base subsegmental atelectasis. CT Angio showed no evidence for PE, + 4mm left lower lobe pulmonary nodule (f/u 12 mo if high risk), aortic atherosclerosis. EKG showed NSR 78bpm, LVH with QRS widening and repolarization abnormality, QTc 471ms, nonspecific TW  changes similar to prior tracings back to 2021. He currently feels well without complaint. History is somewhat challenging at times as when you ask the couple questions about current presentation, they sometimes revert to discussing prior office visits or workups in the past. No other recent cardiac sx.    Past Medical History:  Diagnosis Date   Adenomatous colon polyp 03/2005   tubular adenoma   BCC (basal cell carcinoma), face     left face, R ear (04/2014), nose 05/2016   Benign familial tremor 08/29/2012   Erectile dysfunction    Hemorrhoids    internal and external   Hip arthritis    left   IFG (impaired fasting glucose) 12/10/2012   Glucose 111 11/2012    Insomnia    Mild neurocognitive disorder 09/11/2019   Pseudogout of knee 07/2003   left   Pure hypercholesterolemia     Past Surgical History:  Procedure Laterality Date   COLONOSCOPY  6/06, 05/2010   Dr. [name]   MOHS SURGERY  6/07   L face, Dr. Link Snuffer   POLYPECTOMY     TONSILLECTOMY     TOTAL HIP ARTHROPLASTY Left 04/18/2021   Procedure: LEFT TOTAL HIP ARTHROPLASTY ANTERIOR APPROACH;  Surgeon: Melrose Nakayama, MD;  Location: [facility];  Service: Orthopedics;  Laterality: Left;   VASECTOMY       Home Medications:  Prior to Admission medications   Medication Sig Start Date End Date Taking? Authorizing Provider  acetaminophen (TYLENOL) 650 MG CR tablet Take 650 mg by mouth every 8 (eight) hours as needed for pain.    [provider]  amLODipine (NORVASC) 5 MG tablet Take 1 tablet (5 mg total) by mouth daily. 04/14/21   [name], MD  aspirin EC 81 MG tablet Take 1 tablet (81 mg total) by mouth 2 (two) times daily after a meal. Swallow whole. 04/18/21 04/18/22  Loni Dolly, PA-C  cholecalciferol (VITAMIN D) 1000 units tablet Take 1,000 Units by mouth daily.    [provider]  Co-Enzyme Q-10 100 MG CAPS Take 100 mg by mouth daily.    [provider]  Cyanocobalamin (VITAMIN B 12 PO) Take 1,000 mcg by mouth daily.    [provider]  finasteride (PROSCAR) 5 MG tablet Take 5 mg by mouth daily. 09/16/20   [provider]  Glucosamine-Chondroit-Vit C-Mn (GLUCOSAMINE 1500 COMPLEX) CAPS Take 1 capsule by mouth daily.    [provider]  HYDROcodone-acetaminophen (NORCO/VICODIN) 5-325 MG tablet Take 1-2 tablets by mouth every 6 (six) hours as needed for moderate pain or severe pain (post op pain).  04/18/21 04/18/22  Loni Dolly, PA-C  losartan (COZAAR) 50 MG tablet Take 1 tablet (50 mg total) by mouth daily. 03/28/21   [name], MD  metoprolol succinate (TOPROL-XL) 25 MG 24 hr tablet TAKE ONE TABLET DAILY 04/07/21   [name], MD  primidone (MYSOLINE) 50 MG tablet TAKE ONE TABLET AT BEDTIME Patient taking differently: Take 50 mg by mouth at bedtime. 01/26/21   Tat, Eustace Quail, DO  rosuvastatin (CRESTOR) 20 MG tablet Take 1 tablet (20 mg total) by mouth daily. 04/14/21   [name], MD  sildenafil (VIAGRA) 100 MG tablet Take 100 mg by mouth daily as needed for erectile dysfunction. 03/15/20   [provider]  tiZANidine (ZANAFLEX) 4 MG tablet Take 1 tablet (4 mg total) by mouth every 6 (six) hours as needed for muscle spasms. 04/18/21 04/18/22  Loni Dolly, PA-C    Inpatient  Medications: Scheduled Meds:  Continuous Infusions:  sodium chloride     PRN Meds:   Allergies:    Allergies  Allergen Reactions   Codeine     REACTION: violent vomiting    Social History:   Social History   Socioeconomic History   Marital status: Married    Spouse name: Not on file   Number of children: 2   Years of education: 16   Highest education level: Bachelor's degree (e.g., BA, AB, BS)  Occupational History   Occupation: retired  Tobacco Use   Smoking status: Former    Pack years: 0.00    Types: Cigars   Smokeless tobacco: Never   Tobacco comments:    as a teenager  Vaping Use   Vaping Use: Never used  Substance and Sexual Activity   Alcohol use: Yes    Alcohol/week: 0.0 standard drinks    Comment: 5-6 ounces of [place] wine or beer 6-7 x/week    Drug use: No   Sexual activity: Yes  Other Topics Concern   Not on file  Social History Narrative   Widowed 2013 (after wife's long battle with MS).  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   Children live in [place], [place] and [place].   Married March 2016 [name])   Social Determinants of Health   Financial Resource Strain: Not on file  Food Insecurity: Not on file  Transportation Needs: Not on file  Physical Activity: Not on file  Stress: Not on file  Social Connections: Not on file  Intimate Partner Violence: Not on file    Family History:   Family History  Problem Relation Age of Onset   Cancer Mother 90       colon cancer   Diabetes Mother    Colon cancer Mother    Colon polyps Mother    Heart disease Father        MI   Hypertension Father    Hyperlipidemia Brother    Heart disease Brother        septal defect   Tremor Brother    Hyperlipidemia Brother    Tremor Brother    Hyperlipidemia Brother    Hyperlipidemia Son    Heart disease Son        pacemaker (had syncope, early 2022)   Syncope episode Son        r/b pacemaker   Hyperlipidemia Son    Sleep apnea Son    Esophageal cancer Neg Hx    Stomach cancer Neg Hx    Rectal cancer Neg Hx      ROS:  Please see the history of present illness.  All other ROS reviewed and negative.     Physical Exam/Data:   Vitals:   04/20/21 0857 04/20/21 0913 04/20/21 1216  BP: (!) 145/75  (!) 148/76  Pulse: 81  88  Resp: 16  18  Temp: 99.3 F (37.4 C) 98.7 F (37.1 C) 98.5 F (36.9 C)  TempSrc: Oral Oral Oral  SpO2: 100%  100%   No intake or output data in the 24 hours ending 04/20/21 1635 Last 3 Weights 04/18/2021 04/11/2021 03/29/2021  Weight (lbs) 173 lb 4.5 oz 173 lb 3.2 oz 174 lb 3.2 oz  Weight (kg) 78.6 kg 78.563 kg 79.017 kg     There is no height or weight on file to calculate BMI.  General:  Well developed, well nourished WM in no acute distress. Lying flat in bed. Head: Normocephalic, atraumatic, sclera non-icteric, no xanthomas, nares are without discharge. Neck: Negative for carotid bruits. JVP not elevated. Lungs: Clear bilaterally to auscultation without wheezes, rales, or rhonchi.  Breathing is unlabored. Heart: RRR S1 S2 without murmurs, rubs, or gallops.  Abdomen: Soft, non-tender, non-distended with normoactive bowel sounds. No rebound/guarding. Extremities: No clubbing or cyanosis. No edema. Distal pedal pulses are 2+ and equal bilaterally. Neuro: Alert and oriented X 3. Moves all extremities spontaneously. Psych:  Responds to questions appropriately with a normal affect.  EKG:  The EKG was personally reviewed and demonstrates:  NSR 78bpm, LVH with QRS widening and repolarization abnormality, QTc 436ms, nonspecific TW changes similar to prior tracings back to 2021  Telemetry:  Telemetry was personally reviewed and demonstrates: N/A  Relevant CV Studies: 2D echo 04/25/20  1. Left ventricular ejection fraction, by estimation, is 60 to 65%. The  left ventricle has normal function. The left ventricle has no regional  wall motion abnormalities. Left ventricular diastolic parameters are  indeterminate.   2. Right ventricular systolic function is normal. The right ventricular  size is normal. There is normal pulmonary artery systolic pressure. The  estimated right ventricular systolic pressure is 27.7 mmHg.   3. The mitral valve is grossly normal. Mild mitral valve regurgitation.  No evidence of mitral stenosis.   4. The aortic valve is tricuspid. Aortic valve regurgitation is not  visualized. Mild aortic valve sclerosis is present, with no evidence of  aortic valve stenosis.   5. The inferior vena cava is normal in size with greater than 50%  respiratory variability, suggesting right atrial pressure of 3 mmHg.   NST 04/11/2020 Nuclear stress EF: 56%. There was no ST segment deviation noted during stress. No T wave inversion was noted during stress. The study is normal. This is a low risk study. The left ventricular ejection fraction is normal (55-65%).   1. There are reduced counts in the mid inferior segment on rest and stress imaging consistent with diaphragm  attenuation. 2. This is a normal study without evidence of ischemia or infarction. 3. Normal LVEF, 56%. 4. This is a low-risk study.    Laboratory Data:  High Sensitivity Troponin:   Recent Labs  Lab 04/20/21 0850 04/20/21 1233  TROPONINIHS 20* 16     Chemistry Recent Labs  Lab 04/20/21 0850  NA 131*  K 4.2  CL 99  CO2 25  GLUCOSE 159*  BUN 15  CREATININE 1.05  CALCIUM 9.0  GFRNONAA >60  ANIONGAP 7    No results for input(s): PROT, ALBUMIN, AST, ALT, ALKPHOS, BILITOT in the last 168 hours. Hematology Recent Labs  Lab 04/20/21 0850  WBC 11.6*  RBC 3.93*  HGB 11.8*  HCT 36.3*  MCV 92.4  MCH 30.0  MCHC 32.5  RDW 13.1  PLT 194   BNPNo results for input(s): BNP, PROBNP in the last 168 hours.  DDimer No results for input(s): DDIMER in the last 168 hours.   Radiology/Studies:  DG Chest 2 View  Result Date: 04/20/2021 CLINICAL DATA:  Syncope. EXAM: CHEST - 2 VIEW COMPARISON:  04/11/2021. FINDINGS: Mediastinum hilar structures normal. Borderline cardiomegaly. No pulmonary venous congestion. Low lung volumes with mild left base subsegmental atelectasis. No focal infiltrate. No pleural effusion or pneumothorax. Degenerative change thoracic spine. IMPRESSION: 1.  Borderline cardiomegaly.  No pulmonary venous congestion. 2.  Low lung volumes with mild left base subsegmental atelectasis. Electronically Signed  By: [place]   On: 04/20/2021 10:01   CT Angio Chest PE W and/or Wo Contrast  Result Date: 04/20/2021 CLINICAL DATA:  Syncope. Hip surgery 3 days ago. Clinical concern for pulmonary embolus. EXAM: CT ANGIOGRAPHY CHEST WITH CONTRAST TECHNIQUE: Multidetector CT imaging of the chest was performed using the standard protocol during bolus administration of intravenous contrast. Multiplanar CT image reconstructions and MIPs were obtained to evaluate the vascular anatomy. CONTRAST:  114mL OMNIPAQUE IOHEXOL 350 MG/ML SOLN COMPARISON:  No comparison studies available.  FINDINGS: Cardiovascular: The heart size is normal. No substantial pericardial effusion. Coronary artery calcification is evident. Atherosclerotic calcification is noted in the wall of the thoracic aorta. There is no filling defect within the opacified pulmonary arteries to suggest the presence of an acute pulmonary embolus. Mediastinum/Nodes: No mediastinal lymphadenopathy. There is no hilar lymphadenopathy. The esophagus has normal imaging features. There is no axillary lymphadenopathy. Lungs/Pleura: 2 mm nodule identified left lower lobe on image 113/6. No suspicious pulmonary nodule or mass. No focal airspace consolidation. No pleural effusion. Upper Abdomen: Unremarkable. Musculoskeletal: No worrisome lytic or sclerotic osseous abnormality. Review of the MIP images confirms the above findings. IMPRESSION: 1. No CT evidence for acute pulmonary embolus. 2. 2 mm left lower lobe pulmonary nodule. No follow-up needed if patient is low-risk. Non-contrast chest CT can be considered in 12 months if patient is high-risk. This recommendation follows the consensus statement: Guidelines for Management of Incidental Pulmonary Nodules Detected on CT Images: From the Fleischner Society 2017; Radiology 2017; 284:228-243. 3. Aortic Atherosclerosis (ICD10-I70.0). Electronically Signed   By: Misty Stanley M.D.   On: 04/20/2021 13:56   DG C-Arm 1-60 Min-No Report  Result Date: 04/18/2021 Fluoroscopy was utilized by the requesting physician.  No radiographic interpretation.   DG HIP OPERATIVE UNILAT W OR W/O PELVIS LEFT  Result Date: 04/18/2021 CLINICAL DATA:  Left hip replacement. EXAM: OPERATIVE LEFT HIP (WITH PELVIS IF PERFORMED) 2 VIEWS TECHNIQUE: Fluoroscopic spot image(s) were submitted for interpretation post-operatively. COMPARISON:  08/26/2014. FINDINGS: Total left hip replacement. Hardware intact. Anatomic alignment. Degenerative changes right hip. IMPRESSION: Total left hip replacement with anatomic alignment.  Electronically Signed   By: Marcello Moores  Register   On: 04/18/2021 11:20     Assessment and Plan:   1. Syncope  - etiology not totally clear, preceded by episode of coughing/gagging, ? cough syncope vs vasovagal related to hip pain - EKG shows chronic LVH with QRS widening, no acute changes from prior - CTA negative for PE - hsTroponin low/flat at 20-16, not consistent with ACS, no chest pain - orthostatic BPs negative, no HR response available but all vital checks have shown normal HR - add-on baseline TSH to labs - per d/w MD, recommend to stop amlodipine and tizanidine, have sent message to our office to arrange 48 hour BP monitor and 30 day event monitor for syncope. Also arranged f/u - office will call pt with further details. I requested office nurse to assist with the BP monitor order (I placed order for event monitor)  2. Recent hip surgery - per ER, ortho consulted as he missed his post op appt while in ER, they recommended outpatient follow-up at next available  3. Lab abnormalities with mild leukocytosis, post-operative normocytic anemia, and hyponatremia - per ER  4. HTN - per discussion with Eric Simmons, stop amlodipine to allow for permissive HTN for now given episode of syncope (was administered in the hospital recently but they had not yet started this at home -  they're aware to hold off)  5. Pulmonary nodule - recommend attention to this finding upon follow-up to determine if CT scan is needed again in 1 year - will likely defer to primary care - patient/wife made aware of finding  Relayed updated recs to nurse/EDP/EDPA.  Risk Assessment/Risk Scores:      N/A   For questions or updates, please contact [facility] Please consult [email or web address] for contact info under    Signed, Charlie Pitter, PA-C  04/20/2021 4:35 PM

## 2021-04-20 NOTE — ED Notes (Signed)
Pa at the bedside

## 2021-04-20 NOTE — Discharge Instructions (Addendum)
Your laboratory results were within normal limits.  The CT angio of your chest did not show any blood clots.  Please follow-up with your cardiologist Dr. Radford Pax a earliest convenience.  If you experience any chest pain, dizziness, shortness of breath or worsening symptoms you will need to return to the ER via EMS.

## 2021-04-20 NOTE — ED Notes (Signed)
Pt urinated but pt wife emptied urinal before this NT could collect sample. Pt and wife notified again that urine sample is needed and to alert this NT or RN when pt can urinate again, pt agreed.

## 2021-04-20 NOTE — ED Triage Notes (Signed)
Pt arrives by [facility] from home where he lives independently with his spouse.  Pt reports that he had left hip surgery 2 days ago by Dr.  Rhona Raider and has been recovering at home without complications until he fainted this am while sitting down.  No CP and sob with this, pt did not fall.  CBG was 129 for ems and pt was noted to have a LBBB.  Pt is alert and oriented.  Surgical dressing WNL, left hip is "sore" 2/10.

## 2021-04-21 ENCOUNTER — Telehealth: Payer: Self-pay

## 2021-04-21 DIAGNOSIS — I10 Essential (primary) hypertension: Secondary | ICD-10-CM

## 2021-04-21 DIAGNOSIS — M6281 Muscle weakness (generalized): Secondary | ICD-10-CM | POA: Diagnosis not present

## 2021-04-21 DIAGNOSIS — Z96642 Presence of left artificial hip joint: Secondary | ICD-10-CM | POA: Diagnosis not present

## 2021-04-21 DIAGNOSIS — Z9889 Other specified postprocedural states: Secondary | ICD-10-CM | POA: Diagnosis not present

## 2021-04-21 DIAGNOSIS — R2689 Other abnormalities of gait and mobility: Secondary | ICD-10-CM | POA: Diagnosis not present

## 2021-04-21 DIAGNOSIS — R55 Syncope and collapse: Secondary | ICD-10-CM

## 2021-04-21 NOTE — Telephone Encounter (Signed)
-----   Message from Charlie Pitter, [place] sent at 04/20/2021  5:10 PM EDT ----- Regarding: Orders from [place], also cc'ing Valetta Fuller and [place] here. This is a patient of Dr. Theodosia Blender who we saw in the ER this evening for episode of syncope. He has mild cognitive impairment so his wife did most of the talking. He is being discharged but needs a 30 day event monitor as well as 48 hour blood pressure monitor. I put in the order for the event monitor but did not know how to order the 48 hour BP monitor. TT wasn't sure either. I already arranged his follow-up on 06/27/21 with me. I told them about the appointment but told them that our office would contact them with further instructions for the BP monitor and event monitor. Honestly his wife seemed a little frazzled in the ER (he was in a busy hallway bed) so we tried to keep our instructions here to a minimum because it was about all they could handle, so appreciate you guys reaching out to them with their monitor instructions. I would also suggest reminding them of the clinic visit at that phone call too. They've really been through it the last few days as he recently had surgery! Thank you!! Lisbeth Renshaw

## 2021-04-24 ENCOUNTER — Other Ambulatory Visit: Payer: PPO

## 2021-04-24 DIAGNOSIS — M6281 Muscle weakness (generalized): Secondary | ICD-10-CM | POA: Diagnosis not present

## 2021-04-24 DIAGNOSIS — R2689 Other abnormalities of gait and mobility: Secondary | ICD-10-CM | POA: Diagnosis not present

## 2021-04-24 DIAGNOSIS — Z9889 Other specified postprocedural states: Secondary | ICD-10-CM | POA: Diagnosis not present

## 2021-04-24 DIAGNOSIS — Z96642 Presence of left artificial hip joint: Secondary | ICD-10-CM | POA: Diagnosis not present

## 2021-04-25 ENCOUNTER — Telehealth: Payer: Self-pay | Admitting: *Deleted

## 2021-04-25 NOTE — Telephone Encounter (Signed)
Dr. Radford Pax has ordered a 30 day cardiac event monitor and a 24 hour ambulatory blood pressure monitor.  We have openings on the monitor schedule on Monday , 05/08/2021, to have those monitors applied.  Please call Talynn Lebon in Monitor department at [phone] to schedule. If preferred we could also have cardiac event monitor shipped to your home.  Please let us know how you would like to proceed.

## 2021-04-25 NOTE — Telephone Encounter (Signed)
Follow up:   Patient returning a call back.  

## 2021-04-25 NOTE — Telephone Encounter (Signed)
Patient scheduled to have Cardiac event monitor and 24 Hour Ambulatory blood pressure monitor applied, Monday, 05/08/21, 10:00 A.M..

## 2021-04-26 ENCOUNTER — Ambulatory Visit: Payer: PPO | Admitting: Family Medicine

## 2021-04-26 DIAGNOSIS — M6281 Muscle weakness (generalized): Secondary | ICD-10-CM | POA: Diagnosis not present

## 2021-04-26 DIAGNOSIS — Z9889 Other specified postprocedural states: Secondary | ICD-10-CM | POA: Diagnosis not present

## 2021-04-26 DIAGNOSIS — Z96642 Presence of left artificial hip joint: Secondary | ICD-10-CM | POA: Diagnosis not present

## 2021-04-26 DIAGNOSIS — R2689 Other abnormalities of gait and mobility: Secondary | ICD-10-CM | POA: Diagnosis not present

## 2021-04-28 DIAGNOSIS — Z9889 Other specified postprocedural states: Secondary | ICD-10-CM | POA: Diagnosis not present

## 2021-05-05 ENCOUNTER — Telehealth: Payer: Self-pay | Admitting: Pharmacist

## 2021-05-05 NOTE — Telephone Encounter (Signed)
Called pt to follow up with home BP readings. He went to the ED with syncope/collapse on 6/23 after his THA on 6/21. Had been taking his Toprol and losartan but not amlodipine that I had started at 6/17 visit. Orthostatics were normal, spell thought to be vasovagal due to leg pain after surgery combined with attack of coughing and choking. Amlodipine was held.  Pt reports home BP since discharge of 145/76, 126/72, 144/75. Taking losartan 50mg  AM and Toprol 25mg  in PM, no further syncopal episodes. Being set up with 30 day monitor and 48 hr BP cuff.  Pt advised to continue monitoring BP at home as well. I'll call him in another 2 weeks to see how readings are looking in case amlodipine needs to be resumed.

## 2021-05-08 ENCOUNTER — Encounter: Payer: Self-pay | Admitting: *Deleted

## 2021-05-08 ENCOUNTER — Ambulatory Visit (INDEPENDENT_AMBULATORY_CARE_PROVIDER_SITE_OTHER): Payer: PPO

## 2021-05-08 ENCOUNTER — Other Ambulatory Visit: Payer: Self-pay

## 2021-05-08 DIAGNOSIS — I10 Essential (primary) hypertension: Secondary | ICD-10-CM | POA: Diagnosis not present

## 2021-05-08 DIAGNOSIS — R55 Syncope and collapse: Secondary | ICD-10-CM | POA: Diagnosis not present

## 2021-05-08 DIAGNOSIS — Z9889 Other specified postprocedural states: Secondary | ICD-10-CM | POA: Diagnosis not present

## 2021-05-08 NOTE — Progress Notes (Unsigned)
[employer] 30 day cardiac event monitor applied in office.

## 2021-05-08 NOTE — Progress Notes (Unsigned)
24 hour ambulatory blood pressure monitor applied to patient using standard adult cuff. 

## 2021-05-09 ENCOUNTER — Telehealth: Payer: Self-pay

## 2021-05-09 MED ORDER — AMLODIPINE BESYLATE 2.5 MG PO TABS: 2.5000 mg | ORAL_TABLET | Freq: Every day | ORAL | 3 refills | Status: DC

## 2021-05-09 NOTE — Telephone Encounter (Signed)
Spoke with patient and advised him to continue all current medication along with the amlodipine. Patient verbalized understanding.

## 2021-05-09 NOTE — Telephone Encounter (Signed)
The patient has been notified of the result and verbalized understanding.  All questions (if any) were answered. Antonieta Iba, RN 05/09/2021 3:13 PM  Rx has been sent in. Patient will start amlodipine 2.5 mg daily and call next week with his BP and HR readings.

## 2021-05-09 NOTE — Telephone Encounter (Signed)
Pt is calling back to ask if the AmLodopine in addition to his other meds. Please advise pt further

## 2021-05-09 NOTE — Telephone Encounter (Signed)
-----   Message from [name], MD sent at 05/09/2021  1:00 PM EDT ----- BP monitor showed persistently elevated BP - restart amlodipine at 2.5mg  daily and check BP and HR daily for a week and call with results

## 2021-05-22 ENCOUNTER — Telehealth: Payer: Self-pay | Admitting: Pharmacist

## 2021-05-22 NOTE — Telephone Encounter (Signed)
Home BP readings since resuming low dose amlodipine as follows:  7/21 - 137/78, 150/81 bedtime 7/22 - 119/76 7/23 - 133/76, 157/83 bedtime 7/24 - 145/72 PM 7/25 - 121/66  Generally 120-130 AM, 140-150 PM.  Takes Toprol and losartan in the AM, amlodipine in the late afternoon. PM elevated BP readings taken about 10-11pm.  Will increase amlodipine to '5mg'$  daily. Pt aware to continue monitoring BP readings. I'll call in another 3 weeks to follow up.

## 2021-06-12 ENCOUNTER — Telehealth: Payer: Self-pay | Admitting: Pharmacist

## 2021-06-12 MED ORDER — AMLODIPINE BESYLATE 5 MG PO TABS: 5.0000 mg | ORAL_TABLET | Freq: Every day | ORAL | 3 refills | Status: DC

## 2021-06-12 NOTE — Telephone Encounter (Signed)
Called pt to follow up with home BP readings. Reports improvement in readings to 120-130/< 80. Denies dizziness or LE edema. Will continue higher dose of amlodipine '5mg'$  daily. New rx sent to pharmacy (had been taking 2 of his 2.'5mg'$  tablets). He is aware to call clinic with any future concerns.

## 2021-06-26 ENCOUNTER — Encounter: Payer: Self-pay | Admitting: Physician Assistant

## 2021-06-26 NOTE — Progress Notes (Signed)
Cardiology Office Note    Date:  06/27/2021   ID:  Eric Simmons., DOB [birth year], MRN [id number]  PCP:  [name], MD  Cardiologist:  [name], MD  Electrophysiologist:  None   Chief Complaint: f/u syncope  History of Present Illness:   Eric Simmons. is a 80 y.o. male with history of HTN, HLD, family history of CAD (Father with MI age 66), mild MR by echo 2021, aortic atherosclerosis, mild cognitive impairment, essential tremor, ED, arthritis who presents for f/u of syncope.  [name] previously established care with Dr. Radford Pax in 2021 for DOE and chest discomfort. Nuclear stress test 03/2020 showed reduced counts in the mid inferior segment on rest and stress imaging consistent with diaphragm attenuation, normal study without evidence of ischemia or infarction, EF 56%. 2D Echo 03/2020 showed EF 60-65%, mild MR. Metoprolol was added with resolution in symptoms. His symptoms were felt related to poorly controlled HTN and diastolic dysfunction at the time. He was seen in the HTN clinic earlier this year for continued follow-up of HTN at which time he remained hypertensive so amlodipine was planned. Simvastatin was changed to rosuvastatin due to interaction with amlodipine. He underwent L total hip arthroplasty on 04/18/21. During admission BP was high and he got his amlodipine, losartan and metoprolol while admitted with discharge BP 124/56. He went home with pain medication as well. He then seen in the ED with episode of syncope a few days after discharge. They had not yet started his amlodipine at home. He was sitting down for breakfast and was experiencing significant hip pain at the time, had not yet taken his pain medication. Shortly after, his wife observed him to have gagged/coughed with some choking then he slumped over/passed out to one side. No B/B incontinence or seizure activity. He was out for max 20 seconds per wife - states it was all very quick. Their daughter held him  up and he came to nearly fully awake without any sequelae. ED workup showed mild leukocytosis 11.6, Hgb 11.8, Plt 194, hsTroponin 20-16, hyponatremia of 131, K 4.2. and vitals/EKG were stable. CT Angio showed no evidence for PE, + 56m left lower lobe pulmonary nodule (f/u 12 mo if high risk), aortic atherosclerosis. He was discharged home with instructions to remain off tizanidine and amlodipine. F/u OP BP monitor showed average BP 147/77 and event monitor showed no significant arrhythmias.  Dr. TRadford Paxfelt that episode was likely vasovagal in origin from severe leg pain after just walking to the kitchen along with an attack of coughing and choking. Amlodipine was subsequently restarted by the pharmD HTN team with improved BPs.  He returns for follow-up today doing great. He has been walking 2-3 miles a day and feeling well. No dizziness or recurrent syncope. No palpitations, chest pain, SOB. He has been following his BP at home and it has been ranging 10000000systolic. He will have a rare BP in the teens. He is very pleased with how he is doing.  Labwork independently reviewed: 03/2021 Hgb 11.8, troponin 20-16, Na 13, K 4.2, Cr 1.05, A1c 6, LDL 77 2020 LDL 101   Past Medical History:  Diagnosis Date   Adenomatous colon polyp 03/2005   tubular adenoma   Aortic atherosclerosis (HCC)    BCC (basal cell carcinoma), face    left face, R ear (04/2014), nose 05/2016   Benign familial tremor 08/29/2012   Erectile dysfunction    Hemorrhoids    internal and  external   Hip arthritis    left   IFG (impaired fasting glucose) 12/10/2012   Glucose 111 11/2012    Insomnia    Mild mitral regurgitation    Mild neurocognitive disorder 09/11/2019   Pseudogout of knee 07/2003   left   Pulmonary nodule    Pure hypercholesterolemia    Syncope     Past Surgical History:  Procedure Laterality Date   COLONOSCOPY  6/06, 05/2010   Dr. [name]   MOHS SURGERY  6/07   L face, Dr. Link Snuffer   POLYPECTOMY      TONSILLECTOMY     TOTAL HIP ARTHROPLASTY Left 04/18/2021   Procedure: LEFT TOTAL HIP ARTHROPLASTY ANTERIOR APPROACH;  Surgeon: Melrose Nakayama, MD;  Location: [facility];  Service: Orthopedics;  Laterality: Left;   VASECTOMY      Current Medications: Current Meds  Medication Sig   acetaminophen (TYLENOL) 650 MG CR tablet Take 650 mg by mouth every 8 (eight) hours as needed for pain.   amLODipine (NORVASC) 5 MG tablet Take 1 tablet (5 mg total) by mouth daily.   cholecalciferol (VITAMIN D) 1000 units tablet Take 1,000 Units by mouth daily.   Cyanocobalamin (VITAMIN B 12 PO) Take 1,000 mcg by mouth daily.   finasteride (PROSCAR) 5 MG tablet Take 5 mg by mouth daily.   losartan (COZAAR) 50 MG tablet Take 1 tablet (50 mg total) by mouth daily.   metoprolol succinate (TOPROL-XL) 25 MG 24 hr tablet TAKE ONE TABLET DAILY   primidone (MYSOLINE) 50 MG tablet TAKE ONE TABLET AT BEDTIME   rosuvastatin (CRESTOR) 20 MG tablet Take 1 tablet (20 mg total) by mouth daily.      Allergies:   Codeine   Social History   Socioeconomic History   Marital status: Married    Spouse name: Not on file   Number of children: 2   Years of education: 16   Highest education level: Bachelor's degree (e.g., BA, AB, BS)  Occupational History   Occupation: retired  Tobacco Use   Smoking status: Former    Types: Cigars   Smokeless tobacco: Never   Tobacco comments:    as a teenager  Vaping Use   Vaping Use: Never used  Substance and Sexual Activity   Alcohol use: Yes    Alcohol/week: 0.0 standard drinks    Comment: 5-6 ounces of Thueson wine or beer 6-7 x/week    Drug use: No   Sexual activity: Yes  Other Topics Concern   Not on file  Social History Narrative   Widowed 2013 (after wife's long battle with MS).  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  Children live in [place], [place]  and [place].   Married March 2016 [name])   Social Determinants of Health   Financial Resource Strain: Not on file  Food Insecurity: Not on file  Transportation Needs: Not on file  Physical Activity: Not on file  Stress: Not on file  Social Connections: Not on file     Family History:  The patient's family history includes Cancer (age of onset: 39) in his mother; Colon cancer in his mother; Colon polyps in his mother; Diabetes in his mother; Heart disease in his brother, father, and son; Hyperlipidemia in his brother, brother, brother, son, and son; Hypertension in his father; Sleep apnea in his son; Syncope episode in his son; Tremor in his  brother and brother. There is no history of Esophageal cancer, Stomach cancer, or Rectal cancer.  ROS:   Please see the history of present illness.  All other systems are reviewed and otherwise negative.    EKGs/Labs/Other Studies Reviewed:    Studies reviewed are outlined and summarized above. Reports included below if pertinent.  2D echo 03/2020  1. Left ventricular ejection fraction, by estimation, is 60 to 65%. The  left ventricle has normal function. The left ventricle has no regional  wall motion abnormalities. Left ventricular diastolic parameters are  indeterminate.   2. Right ventricular systolic function is normal. The right ventricular  size is normal. There is normal pulmonary artery systolic pressure. The  estimated right ventricular systolic pressure is 0000000 mmHg.   3. The mitral valve is grossly normal. Mild mitral valve regurgitation.  No evidence of mitral stenosis.   4. The aortic valve is tricuspid. Aortic valve regurgitation is not  visualized. Mild aortic valve sclerosis is present, with no evidence of  aortic valve stenosis.   5. The inferior vena cava is normal in size with greater than 50%  respiratory variability, suggesting right atrial pressure of 3 mmHg.   Nuc 03/2020 Nuclear stress EF: 56%. There was no ST  segment deviation noted during stress. No T wave inversion was noted during stress. The study is normal. This is a low risk study. The left ventricular ejection fraction is normal (55-65%).   1. There are reduced counts in the mid inferior segment on rest and stress imaging consistent with diaphragm attenuation.  2. This is a normal study without evidence of ischemia or infarction.  3. Normal LVEF, 56%.  4. This is a low-risk study.   BP Monitor 04/2021 Overall BP 147/3mHg Overall BP 146/733mg during awake hours Overall BP 151/7651m during asleep hours 70% of SBP>140m53mand >120mm2mhile awake 15% of DBP>90mmH29md >80mmhg26mle asleep   Event Monitor 04/2021 Predominant rhythm was normal sinus rhythm with average heart rate 67bpm and ranged from 49 to 119bpm. Atrial triplet.      EKG:  EKG is not ordered today  Recent Labs: 03/29/2021: ALT 18 04/20/2021: BUN 15; Creatinine, Ser 1.05; Hemoglobin 11.8; Platelets 194; Potassium 4.2; Sodium 131  Recent Lipid Panel    Component Value Date/Time   CHOL 144 03/29/2021 0816   TRIG 101 03/29/2021 0816   HDL 48 03/29/2021 0816   CHOLHDL 3.0 03/29/2021 0816   CHOLHDL 2.9 10/30/2017 0944   VLDL 17 08/20/2016 1051   LDLCALC 77 03/29/2021 0816   LDLCALC 80 10/30/2017 0944    PHYSICAL EXAM:    VS:  BP 132/74   Pulse (!) 59   Ht 6' (1.829 m)   Wt 175 lb 3.2 oz (79.5 kg)   SpO2 97%   BMI 23.76 kg/m   BMI: Body mass index is 23.76 kg/m.  GEN: Well nourished, well developed male in no acute distress HEENT: normocephalic, atraumatic Neck: no JVD, carotid bruits, or masses Cardiac: RRR; no murmurs, rubs, or gallops, no edema  Respiratory:  clear to auscultation bilaterally, normal work of breathing GI: soft, nontender, nondistended, + BS MS: no deformity or atrophy Skin: warm and dry, no rash Neuro:  Alert and Oriented x 3, Strength and sensation are intact, follows commands Psych: euthymic mood, full affect  Wt Readings  from Last 3 Encounters:  06/27/21 175 lb 3.2 oz (79.5 kg)  04/18/21 173 lb 4.5 oz (78.6 kg)  04/11/21 173 lb 3.2 oz (78.6 kg)  ASSESSMENT & PLAN:   1. Syncope - suspected due to vasovagal syncope in the setting of severe hip pain at the time followed by episode of choking/coughing. Event monitor was reassuring. Per consult note, Dr. Radford Pax did not feel his echo or stress test needed to be repeated unless syncope recurred since these were performed last year. We discussed warning symptoms to be aware of going forward.  2. Essential HTN - BP controlled on present regimen. Appreciate pharmD HTN clinic following him so closely before. No changes made today.  3. Pulmonary nodule - incidental finding noted on CT 03/2021, discussed with patient to review timing of f/u study with PCP (radiologist suggested consideration of 12 months).  4. Mild mitral regurgitation - no murmur on exam. F/u study would be due 2024-2026 per guidelines, or as clinically indicated in the future.  5. Aortic atherosclerosis - statin switched earlier this year due to addition of amlodipine. Will check CMET/direct LDL/lipid profile today.  6. Hyponatremia - low sodium level noted in ED labwork. Will f/u by labs today.  Disposition: F/u with Dr. Radford Pax in 6 months - if no syncope by that time can likely return to yearly follow-up.   Medication Adjustments/Labs and Tests Ordered: Current medicines are reviewed at length with the patient today.  Concerns regarding medicines are outlined above. Medication changes, Labs and Tests ordered today are summarized above and listed in the Patient Instructions accessible in Encounters.   Signed, Charlie Pitter, PA-C  06/27/2021 11:30 AM    [address], [place], [place]  [address] Phone: [phone]; Fax: [phone]

## 2021-06-27 ENCOUNTER — Other Ambulatory Visit: Payer: Self-pay

## 2021-06-27 ENCOUNTER — Encounter: Payer: Self-pay | Admitting: Physician Assistant

## 2021-06-27 ENCOUNTER — Ambulatory Visit: Payer: PPO | Admitting: Physician Assistant

## 2021-06-27 VITALS — BP 132/74 | HR 59 | Ht 72.0 in | Wt 175.2 lb

## 2021-06-27 DIAGNOSIS — R911 Solitary pulmonary nodule: Secondary | ICD-10-CM | POA: Diagnosis not present

## 2021-06-27 DIAGNOSIS — I340 Nonrheumatic mitral (valve) insufficiency: Secondary | ICD-10-CM | POA: Diagnosis not present

## 2021-06-27 DIAGNOSIS — R55 Syncope and collapse: Secondary | ICD-10-CM

## 2021-06-27 DIAGNOSIS — E871 Hypo-osmolality and hyponatremia: Secondary | ICD-10-CM | POA: Diagnosis not present

## 2021-06-27 DIAGNOSIS — I10 Essential (primary) hypertension: Secondary | ICD-10-CM | POA: Diagnosis not present

## 2021-06-27 DIAGNOSIS — I700 Atherosclerosis of aorta: Secondary | ICD-10-CM

## 2021-06-27 NOTE — Patient Instructions (Addendum)
Medication Instructions:  Your physician recommends that you continue on your current medications as directed. Please refer to the Current Medication list given to you today.  *If you need a refill on your cardiac medications before your next appointment, please call your pharmacy*   Lab Work: TODAY:  BMET, LIPID, & DIRECT LDL  If you have labs (blood work) drawn today and your tests are completely normal, you will receive your results only by: [place] (if you have MyChart) OR A paper copy in the mail If you have any lab test that is abnormal or we need to change your treatment, we will call you to review the results.   Testing/Procedures: Your physician has requested that you have an echocardiogram. Echocardiography is a painless test that uses sound waves to create images of your heart. It provides your doctor with information about the size and shape of your heart and how well your heart's chambers and valves are working. This procedure takes approximately one hour. There are no restrictions for this procedure.    Follow-Up: At [facility], you and your health needs are our priority.  As part of our continuing mission to provide you with exceptional heart care, we have created designated Provider Care Teams.  These Care Teams include your primary Cardiologist (physician) and Advanced Practice Providers (APPs -  Physician Assistants and Nurse Practitioners) who all work together to provide you with the care you need, when you need it.  We recommend signing up for the patient portal called "MyChart".  Sign up information is provided on this After Visit Summary.  MyChart is used to connect with patients for Virtual Visits (Telemedicine).  Patients are able to view lab/test results, encounter notes, upcoming appointments, etc.  Non-urgent messages can be sent to your provider as well.   To learn more about what you can do with MyChart, go to [email or web address].    Your next  appointment:   6 month(s)  The format for your next appointment:   In Person  Provider:   You may see [name], MD or one of the following Advanced Practice Providers on your designated Care Team:   Melina Copa, PA-C Ermalinda Barrios, PA-C   Other Instructions   Your CT scan in the hospital in June 2022 showed a pulmonary nodule. You should discuss with your primary care when this should be followed up. The radiologist suggested to consider a repeat scan in 1 year (June 2023).

## 2021-07-13 DIAGNOSIS — L821 Other seborrheic keratosis: Secondary | ICD-10-CM | POA: Diagnosis not present

## 2021-07-13 DIAGNOSIS — L812 Freckles: Secondary | ICD-10-CM | POA: Diagnosis not present

## 2021-07-13 DIAGNOSIS — Z85828 Personal history of other malignant neoplasm of skin: Secondary | ICD-10-CM | POA: Diagnosis not present

## 2021-07-13 DIAGNOSIS — L570 Actinic keratosis: Secondary | ICD-10-CM | POA: Diagnosis not present

## 2021-07-13 DIAGNOSIS — D1801 Hemangioma of skin and subcutaneous tissue: Secondary | ICD-10-CM | POA: Diagnosis not present

## 2021-07-17 DIAGNOSIS — Z9889 Other specified postprocedural states: Secondary | ICD-10-CM | POA: Diagnosis not present

## 2021-08-02 ENCOUNTER — Telehealth (INDEPENDENT_AMBULATORY_CARE_PROVIDER_SITE_OTHER): Payer: PPO | Admitting: Medical

## 2021-08-02 ENCOUNTER — Other Ambulatory Visit: Payer: Self-pay

## 2021-08-02 VITALS — BP 138/81 | HR 78 | Temp 99.8°F | Wt 171.0 lb

## 2021-08-02 DIAGNOSIS — R051 Acute cough: Secondary | ICD-10-CM

## 2021-08-02 DIAGNOSIS — U071 COVID-19: Secondary | ICD-10-CM | POA: Diagnosis not present

## 2021-08-02 MED ORDER — MOLNUPIRAVIR EUA 200MG CAPSULE: 4.0000 | ORAL_CAPSULE | Freq: Two times a day (BID) | ORAL | 0 refills | Status: AC

## 2021-08-02 MED ORDER — EMERGEN-C IMMUNE PLUS PO PACK: 1.0000 | PACK | Freq: Two times a day (BID) | ORAL | 0 refills | Status: DC

## 2021-08-02 NOTE — Progress Notes (Signed)
Subjective:     Patient ID: Miachel Simmons., male   DOB: [birth year], 80 y.o.   MRN: [id number]  This visit type was conducted due to national recommendations for restrictions regarding the COVID-19 Pandemic (e.g. social distancing) in an effort to limit this patient's exposure and mitigate transmission in our community.  Due to their co-morbid illnesses, this patient is at least at moderate risk for complications without adequate follow up.  This format is felt to be most appropriate for this patient at this time.    Documentation for virtual audio and video telecommunications through [place] encounter:  The patient was located at home. The provider was located in the office. The patient did consent to this visit and is aware of possible charges through their insurance for this visit.  The other persons participating in this telemedicine service were none. Time spent on call was 20 minutes and in review of previous records 20 minutes total.  This virtual service is not related to other E/M service within previous 7 days.   HPI Chief Complaint  Patient presents with   Covid Positive    Positive covid- tested today. Symptoms cold - started saturday   Virtual consult today for positive COVID test.  Symptoms started with cold symptoms 4 days ago.  He reports runny nose, some cough, mild cough, worse cough at night, drainage at night worse.   No sore throat, no headache, no fever, no body aches or chills.   No ear pain.  No thick mucous production.  No covid contacts.   No sick contacts.  Using some otc cough medication.   Wife due to have surgery in 2 weeks.   Wife without symptoms.   They started quarantine apart when his cough started.   No other aggravating or relieving factors. No other complaint.  Past Medical History:  Diagnosis Date   Adenomatous colon polyp 03/2005   tubular adenoma   Aortic atherosclerosis (HCC)    BCC (basal cell carcinoma), face    left face, R ear  (04/2014), nose 05/2016   Benign familial tremor 08/29/2012   Erectile dysfunction    Hemorrhoids    internal and external   Hip arthritis    left   IFG (impaired fasting glucose) 12/10/2012   Glucose 111 11/2012    Insomnia    Mild mitral regurgitation    Mild neurocognitive disorder 09/11/2019   Pseudogout of knee 07/2003   left   Pulmonary nodule    Pure hypercholesterolemia    Syncope    Current Outpatient Medications on File Prior to Visit  Medication Sig Dispense Refill   acetaminophen (TYLENOL) 650 MG CR tablet Take 650 mg by mouth every 8 (eight) hours as needed for pain.     amLODipine (NORVASC) 5 MG tablet Take 1 tablet (5 mg total) by mouth daily. 90 tablet 3   cholecalciferol (VITAMIN D) 1000 units tablet Take 1,000 Units by mouth daily.     Cyanocobalamin (VITAMIN B 12 PO) Take 1,000 mcg by mouth daily.     finasteride (PROSCAR) 5 MG tablet Take 5 mg by mouth daily.     losartan (COZAAR) 50 MG tablet Take 1 tablet (50 mg total) by mouth daily. 90 tablet 3   metoprolol succinate (TOPROL-XL) 25 MG 24 hr tablet TAKE ONE TABLET DAILY 90 tablet 3   primidone (MYSOLINE) 50 MG tablet TAKE ONE TABLET AT BEDTIME 90 tablet 2   rosuvastatin (CRESTOR) 20 MG tablet Take 1 tablet (20 mg  total) by mouth daily. 90 tablet 3   No current facility-administered medications on file prior to visit.    Review of Systems As in subjective    Objective:   Physical Exam Due to coronavirus pandemic stay at home measures, patient visit was virtual and they were not examined in person.   BP 138/81   Pulse 78   Temp 99.8 F (37.7 C)   Wt 171 lb (77.6 kg)   BMI 23.19 kg/m       Assessment:     Encounter Diagnoses  Name Primary?   COVID Yes   Acute cough       Plan:     Discussed concerns. Symptoms are mild but he is higher risk.   He would like to try medicaiton below as discussed.    General recommendations: I recommend you rest, hydrate well with water and clear fluids  throughout the day.   You can use Tylenol for pain or fever You can use over the counter Delsym or Robitussin DM for cough. You can use over the counter Emetrol for nausea.     If you are having trouble breathing, if you are very weak, have high fever 103 or higher consistently despite Tylenol, or uncontrollable nausea and vomiting, then call or go to the emergency department.    If you have other questions or have other symptoms or questions you are concerned about then please make a virtual visit  Covid symptoms such as fatigue and cough can linger over 2 weeks, even after the initial fever, aches, chills, and other initial symptoms.   Self Quarantine: The CDC, Centers for Disease Control has recommended a self quarantine of 5 days from the start of your illness until you are symptom-free including at least 24 hours of no symptoms including no fever, no shortness of breath, and no body aches and chills, by day 5 before returning to work or general contact with the public.  What does self quarantine mean: avoiding contact with people as much as possible.   Particularly in your house, isolate your self from others in a separate room, wear a mask when possible in the room, particularly if coughing a lot.   Have others bring food, water, medications, etc., to your door, but avoid direct contact with your household contacts during this time to avoid spreading the infection to them.   If you have a separate bathroom and living quarters during the next 2 weeks away from others, that would be preferable.    If you can't completely isolate, then wear a mask, wash hands frequently with soap and water for at least 15 seconds, minimize close contact with others, and have a friend or family member check regularly from a distance to make sure you are not getting seriously worse.     You should not be going out in public, should not be going to stores, to work or other public places until all your symptoms have  resolved and at least 5 days + 24 hours of no symptoms at all have transpired.   Ideally you should avoid contact with others for a full 5 days if possible.  One of the goals is to limit spread to high risk people; people that are older and elderly, people with multiple health issues like diabetes, heart disease, lung disease, and anybody that has weakened immune systems such as people with cancer or on immunosuppressive therapy.      Jin was seen today for covid positive.  Diagnoses and all orders for this visit:  COVID  Acute cough  Other orders -     molnupiravir EUA (LAGEVRIO) 200 mg CAPS capsule; Take 4 capsules (800 mg total) by mouth 2 (two) times daily for 5 days. -     Multiple Vitamins-Minerals (EMERGEN-C IMMUNE PLUS) PACK; Take 1 tablet by mouth 2 (two) times daily.  F/u prn

## 2021-10-02 ENCOUNTER — Other Ambulatory Visit: Payer: Self-pay

## 2021-10-02 ENCOUNTER — Other Ambulatory Visit (INDEPENDENT_AMBULATORY_CARE_PROVIDER_SITE_OTHER): Payer: PPO

## 2021-10-02 DIAGNOSIS — Z23 Encounter for immunization: Secondary | ICD-10-CM

## 2021-10-03 ENCOUNTER — Telehealth: Payer: Self-pay | Admitting: Family Medicine

## 2021-10-03 NOTE — Chronic Care Management (AMB) (Signed)
  Chronic Care Management   Note  10/03/2021 Name: Eric Simmons. MRN: [id number] DOB: Feb 27, 1941  Eric Simmons. is a 80 y.o. year old male who is a primary care patient of [name], MD. I reached out to [name]. by phone today in response to a referral sent by [name]'s PCP, [name], MD.   [name] was given information about Chronic Care Management services today including:  CCM service includes personalized support from designated clinical staff supervised by his physician, including individualized plan of care and coordination with other care providers 24/7 contact phone numbers for assistance for urgent and routine care needs. Service will only be billed when office clinical staff spend 20 minutes or more in a month to coordinate care. Only one practitioner may furnish and bill the service in a calendar month. The patient may stop CCM services at any time (effective at the end of the month) by phone call to the office staff.   Patient agreed to services and verbal consent obtained.   Follow up plan:   Tatjana Secretary/administrator

## 2021-10-11 NOTE — Progress Notes (Signed)
Assessment/Plan:    1.  Essential Tremor  -Continue primidone, 50 mg nightly.  We could increase but he isn't ready for that yet.  -Not candidate for beta-blocker due to history of bradycardia  2.  MCI  -Patient has had neurocognitive testing in November, 2020 and November, 2021 with Dr. Melvyn Novas.  Both of these tests demonstrated mild neurocognitive disorder.  Pt/wife think it has worsened and would like to schedule repeat.    3.  Hand paresthesias  -has some evidence of PN but discussed hands could be CTS as well.  He wants to hold on EMG for now.  Discussed wrist splints but he wants to hold on that for now.    4.  Sialorrhea  -This is commonly associated with PD.  We talked about treatments.  The patient is not a candidate for oral anticholinergic therapy because of increased risk of confusion and falls.  We discussed Botox (type A and B) and 1% atropine drops.  We discusssed that candy like lemon drops can help by stimulating mm of the oropharynx to induce swallowing.  They are going to try this first.  5.  Follow-up 1 year with [name]  Subjective:   Miachel Simmons. was seen today in follow up for essential tremor.  My previous records were reviewed prior to todays visit. Pt with wife who supplements hx. patient continues to have some tremor, but feels that it has been fairly stable.  Is on primidone, 50 mg nightly.  Still has some tremor but doesn't want to change dosage.  "There is no question that medicine helps."  States that having a feeling of finger/toe swelling at night.  By AM, its better.  Also c/o memory change.  Has had cog testing in past.  Wife notes esp trouble with names but remembers faces of people.  Wife notes slow processing.  More trouble with reading comprehension.  Wife asks about sialorrhea.  Current prescribed movement disorder medications: Primidone, 50 mg nightly  ALLERGIES:   Allergies  Allergen Reactions   Codeine     REACTION: violent vomiting     CURRENT MEDICATIONS:  Outpatient Encounter Medications as of 10/16/2021  Medication Sig   acetaminophen (TYLENOL) 650 MG CR tablet Take 650 mg by mouth every 8 (eight) hours as needed for pain.   amLODipine (NORVASC) 5 MG tablet Take 1 tablet (5 mg total) by mouth daily.   cholecalciferol (VITAMIN D) 1000 units tablet Take 1,000 Units by mouth daily.   Cyanocobalamin (VITAMIN B 12 PO) Take 1,000 mcg by mouth daily.   finasteride (PROSCAR) 5 MG tablet Take 5 mg by mouth daily.   losartan (COZAAR) 50 MG tablet Take 1 tablet (50 mg total) by mouth daily.   metoprolol succinate (TOPROL-XL) 25 MG 24 hr tablet TAKE ONE TABLET DAILY   Multiple Vitamins-Minerals (EMERGEN-C IMMUNE PLUS) PACK Take 1 tablet by mouth 2 (two) times daily.   primidone (MYSOLINE) 50 MG tablet TAKE ONE TABLET AT BEDTIME   rosuvastatin (CRESTOR) 20 MG tablet Take 1 tablet (20 mg total) by mouth daily.   No facility-administered encounter medications on file as of 10/16/2021.     Objective:    PHYSICAL EXAMINATION:    VITALS:   Vitals:   10/16/21 1118  BP: 122/72  Pulse: 61  SpO2: 92%  Weight: 179 lb 6.4 oz (81.4 kg)  Height: 6' (1.829 m)     GEN:  The patient appears stated age and is in NAD. HEENT:  Normocephalic, atraumatic.  The mucous membranes are moist. The superficial temporal arteries are without ropiness or tenderness. CV:  RRR Lungs:  CTAB Neck/HEME:  There are no carotid bruits bilaterally.  Neurological examination:  Orientation: The patient is alert and oriented x3. Cranial nerves: There is good facial symmetry. The speech is fluent and clear. Soft palate rises symmetrically and there is no tongue deviation. Hearing is intact to conversational tone. Sensation: Sensation is intact to light touch throughout.  There is decreased vibration distally.   Motor: Strength is at least antigravity x4.  Movement examination: Tone: There is normal tone in the UE/LE Abnormal movements: mild  intention tremor on the right Coordination:  There is no decremation with RAM's, with any form of RAMS, including alternating supination and pronation of the forearm, hand opening and closing, finger taps, heel taps and toe taps. Gait and Station: The patient has no difficulty arising out of a deep-seated chair without the use of the hands. The patient's stride length is good with good arm swing. I have reviewed and interpreted the following labs independently   Chemistry      Component Value Date/Time   NA 131 (L) 04/20/2021 0850   NA 137 04/04/2021 1145   K 4.2 04/20/2021 0850   CL 99 04/20/2021 0850   CO2 25 04/20/2021 0850   BUN 15 04/20/2021 0850   BUN 17 04/04/2021 1145   CREATININE 1.05 04/20/2021 0850   CREATININE 1.11 10/30/2017 0944      Component Value Date/Time   CALCIUM 9.0 04/20/2021 0850   ALKPHOS 91 03/29/2021 0816   AST 24 03/29/2021 0816   ALT 18 03/29/2021 0816   BILITOT 0.6 03/29/2021 0816      Lab Results  Component Value Date   WBC 11.6 (H) 04/20/2021   HGB 11.8 (L) 04/20/2021   HCT 36.3 (L) 04/20/2021   MCV 92.4 04/20/2021   PLT 194 04/20/2021   Lab Results  Component Value Date   TSH 1.230 11/11/2018     Chemistry      Component Value Date/Time   NA 131 (L) 04/20/2021 0850   NA 137 04/04/2021 1145   K 4.2 04/20/2021 0850   CL 99 04/20/2021 0850   CO2 25 04/20/2021 0850   BUN 15 04/20/2021 0850   BUN 17 04/04/2021 1145   CREATININE 1.05 04/20/2021 0850   CREATININE 1.11 10/30/2017 0944      Component Value Date/Time   CALCIUM 9.0 04/20/2021 0850   ALKPHOS 91 03/29/2021 0816   AST 24 03/29/2021 0816   ALT 18 03/29/2021 0816   BILITOT 0.6 03/29/2021 0816     Lab Results  Component Value Date   VITAMINB12 1,078 (H) 06/19/2019       Total time spent on today's visit was 31 minutes, including both face-to-face time and nonface-to-face time.  Time included that spent on review of records (prior notes available to me/labs/imaging if  pertinent), discussing treatment and goals, answering patient's questions and coordinating care.  Cc:  [name], MD

## 2021-10-16 ENCOUNTER — Ambulatory Visit: Payer: PPO | Admitting: Neurology

## 2021-10-16 ENCOUNTER — Other Ambulatory Visit: Payer: Self-pay

## 2021-10-16 ENCOUNTER — Encounter: Payer: Self-pay | Admitting: Neurology

## 2021-10-16 VITALS — BP 122/72 | HR 61 | Ht 72.0 in | Wt 179.4 lb

## 2021-10-16 DIAGNOSIS — R413 Other amnesia: Secondary | ICD-10-CM | POA: Diagnosis not present

## 2021-10-16 DIAGNOSIS — G250 Essential tremor: Secondary | ICD-10-CM

## 2021-10-16 DIAGNOSIS — R202 Paresthesia of skin: Secondary | ICD-10-CM

## 2021-10-16 NOTE — Patient Instructions (Signed)
You have been referred for a neurocognitive evaluation (i.e., evaluation of memory and thinking abilities). Please bring someone with you to this appointment if possible, as it is helpful for the neuropsychologist to hear from both you and another adult who knows you well. Please bring eyeglasses and hearing aids if you wear them and take any medications as you normally would.    The evaluation will take approximately 2-3 hours and has two parts:   The first part is a clinical interview with the neuropsychologist, Dr. Merz.  During the interview, the neuropsychologist will speak with you and the individual you brought to the appointment.    The second part of the evaluation is testing with the doctor's technician, aka psychometrician, Dana or Kim. During the testing, the technician will ask you to remember different types of material, solve problems, and answer some questionnaires. Your family member will not be present for this portion of the evaluation.   Please note: We have to reserve several hours of the neuropsychologist's time and the psychometrician's time for your evaluation appointment. As such, there is a No-Show fee of $100. If you are unable to attend any of your appointments, please contact our office as soon as possible to reschedule.  

## 2021-10-27 ENCOUNTER — Ambulatory Visit: Payer: PPO | Admitting: Neurology

## 2021-11-01 DIAGNOSIS — N3000 Acute cystitis without hematuria: Secondary | ICD-10-CM | POA: Diagnosis not present

## 2021-11-01 DIAGNOSIS — R351 Nocturia: Secondary | ICD-10-CM | POA: Diagnosis not present

## 2021-11-01 DIAGNOSIS — N5201 Erectile dysfunction due to arterial insufficiency: Secondary | ICD-10-CM | POA: Diagnosis not present

## 2021-11-01 DIAGNOSIS — N401 Enlarged prostate with lower urinary tract symptoms: Secondary | ICD-10-CM | POA: Diagnosis not present

## 2021-11-21 ENCOUNTER — Other Ambulatory Visit: Payer: Self-pay | Admitting: Neurology

## 2021-11-21 DIAGNOSIS — G250 Essential tremor: Secondary | ICD-10-CM

## 2021-11-29 ENCOUNTER — Telehealth: Payer: Self-pay | Admitting: Pharmacist

## 2021-11-29 NOTE — Chronic Care Management (AMB) (Signed)
° ° °  Chronic Care Management Pharmacy Assistant   Name: Eric Simmons.  MRN: [id number] DOB: 1941-01-13  Reason for Encounter: Chart review for initial encounter with [name] Clinical Pharmacist via phone call on 11/30/21 at 1:30.   Conditions to be addressed/monitored: HTN, Osteoarthritis, and Hypercholesterolemia  Recent office visits:  08/02/21 Tysinger, Camelia Eng, PA-C - Patient presented via video visit for Positive COVID and other concerns. Prescribed Molnupiravir and Multiple Vitamins- Minerals.  Recent consult visits:  10/16/21 Tat, Eustace Quail, DO (Neurology) - Patient presented for Memory loss and other concern. No medication changes.  07/17/21 Melrose Nakayama (Orthopedic Surgery) - Patient presented for Post- OP Follow up. No other visit details available.  07/13/21 Jarome Matin (Dermatology) - Patient presented for PR Destruc Premal Lesion and other concerns. No other visit details available.  06/27/21 Charlie Pitter, PA-C (Cardiology) - Patient presented for Syncope and collapse and other concerns. Stopped Asprin, Coenzyme Q 10, Glucosamine Chondroit - Vit C, Hydrocodone Acetaminophen and Sildenafil.    Hospital visits:  None in previous 6 months  Medications: Outpatient Encounter Medications as of 11/29/2021  Medication Sig   acetaminophen (TYLENOL) 650 MG CR tablet Take 650 mg by mouth every 8 (eight) hours as needed for pain.   amLODipine (NORVASC) 5 MG tablet Take 1 tablet (5 mg total) by mouth daily.   cholecalciferol (VITAMIN D) 1000 units tablet Take 1,000 Units by mouth daily.   Cyanocobalamin (VITAMIN B 12 PO) Take 1,000 mcg by mouth daily.   finasteride (PROSCAR) 5 MG tablet Take 5 mg by mouth daily.   losartan (COZAAR) 50 MG tablet Take 1 tablet (50 mg total) by mouth daily.   metoprolol succinate (TOPROL-XL) 25 MG 24 hr tablet TAKE ONE TABLET DAILY   Multiple Vitamins-Minerals (EMERGEN-C IMMUNE PLUS) PACK Take 1 tablet by mouth 2 (two) times daily.    primidone (MYSOLINE) 50 MG tablet TAKE ONE TABLET AT BEDTIME   rosuvastatin (CRESTOR) 20 MG tablet Take 1 tablet (20 mg total) by mouth daily.   No facility-administered encounter medications on file as of 11/29/2021.  Fill History Gaps : AMLODIPINE BESYLATE 5 MG TAB 06/28/2021 90   FINASTERIDE 5 MG TAB 10/13/2021 90   LOSARTAN POTASSIUM 50 MG TAB 10/13/2021 90   METOPROLOL SUCCINATE ER 25 MG TAB 10/13/2021 90   PRIMIDONE 50 MG TAB 08/08/2021 90   ROSUVASTATIN CALCIUM 20 MG TAB 10/13/2021 90   Unable to complete USIQ's   Attempted to reach on 11/29/21 left VM  Care Gaps: Zoster Vaccine - Overdue TDAP - Overdue COVID Booster - Overdue AWV- 6/22 BP- 122/72 (10/16/21)  Star Rating Drugs: Losartan 50 mg - Last filled 10/13/21 90 DS at [facility] Rosuvastatin 20 mg - Last filled 10/13/21 90 DS at [facility] Clinical Pharmacist Assistant [phone]

## 2021-11-30 ENCOUNTER — Telehealth: Payer: Self-pay | Admitting: Pharmacist

## 2021-11-30 ENCOUNTER — Telehealth: Payer: PPO

## 2021-11-30 NOTE — Telephone Encounter (Signed)
°  Chronic Care Management   Outreach Note  11/30/2021 Name: Eric Simmons. MRN: [id number] DOB: 1940/11/10  Referred by: [name], MD  Patient had a phone appointment scheduled with clinical pharmacist today.  An unsuccessful telephone outreach was attempted today. The patient was referred to the pharmacist for assistance with care management and care coordination.   If possible, a message was left to return call to: [phone] or to [place]: [place], PharmD, [place] [phone]

## 2021-11-30 NOTE — Progress Notes (Deleted)
Chronic Care Management Pharmacy Note  11/30/2021 Name:  Trevell Simmons. MRN:  [id number] DOB:  1941-09-16  Summary: ***  Recommendations/Changes made from today's visit: ***  Plan: ***   Subjective: Eric Simmons. is an 81 y.o. year old male who is a primary patient of [name], MD.  The CCM team was consulted for assistance with disease management and care coordination needs.    Engaged with patient by telephone for initial visit in response to provider referral for pharmacy case management and/or care coordination services.   Consent to Services:  The patient was given the following information about Chronic Care Management services today, agreed to services, and gave verbal consent: 1. CCM service includes personalized support from designated clinical staff supervised by the primary care provider, including individualized plan of care and coordination with other care providers 2. 24/7 contact phone numbers for assistance for urgent and routine care needs. 3. Service will only be billed when office clinical staff spend 20 minutes or more in a month to coordinate care. 4. Only one practitioner may furnish and bill the service in a calendar month. 5.The patient may stop CCM services at any time (effective at the end of the month) by phone call to the office staff. 6. The patient will be responsible for cost sharing (co-pay) of up to 20% of the service fee (after annual deductible is met). Patient agreed to services and consent obtained.  Patient Care Team: [name], MD as PCP - General (Family Medicine) [name], MD as PCP - Cardiology (Cardiology) Viona Gilmore, [facility] as Pharmacist (Pharmacist)  Recent office visits: 08/02/21 Tysinger, Camelia Eng, PA-C - Patient presented via video visit for Positive COVID and other concerns. Prescribed Molnupiravir and Multiple Vitamins- Minerals.  03/29/21 [name], MD: Patient presented for annual exam. No medication  changes.  Recent consult visits: 10/16/21 Tat, Eustace Quail, DO (Neurology) - Patient presented for Memory loss and other concern. No medication changes.   07/17/21 Melrose Nakayama (Orthopedic Surgery) - Patient presented for Post- OP Follow up. No other visit details available.   07/13/21 Jarome Matin (Dermatology) - Patient presented for PR Destruc Premal Lesion and other concerns. No other visit details available.   06/27/21 Charlie Pitter, PA-C (Cardiology) - Patient presented for Syncope and collapse and other concerns. Stopped Asprin, Coenzyme Q 10, Glucosamine Chondroit - Vit C, Hydrocodone Acetaminophen and Sildenafil.   Hospital visits: {Hospital DC Yes/No:25215}   Objective:  Lab Results  Component Value Date   CREATININE 1.05 04/20/2021   BUN 15 04/20/2021   EGFR 61 04/04/2021   GFRNONAA >60 04/20/2021   GFRAA 81 04/14/2020   NA 131 (L) 04/20/2021   K 4.2 04/20/2021   CALCIUM 9.0 04/20/2021   CO2 25 04/20/2021   GLUCOSE 159 (H) 04/20/2021    Lab Results  Component Value Date/Time   HGBA1C 6.0 (H) 03/29/2021 08:16 AM   HGBA1C 6.0 (A) 07/06/2020 11:03 AM   HGBA1C 6.0 (H) 12/09/2019 08:36 AM    Last diabetic Eye exam: No results found for: HMDIABEYEEXA  Last diabetic Foot exam: No results found for: HMDIABFOOTEX   Lab Results  Component Value Date   CHOL 144 03/29/2021   HDL 48 03/29/2021   LDLCALC 77 03/29/2021   TRIG 101 03/29/2021   CHOLHDL 3.0 03/29/2021    Hepatic Function Latest Ref Rng & Units 03/29/2021 12/09/2019 11/11/2018  Total Protein 6.0 - 8.5 g/dL 6.3 6.5 6.3  Albumin 3.6 - 4.6  g/dL 4.7(H) 4.3 4.4  AST 0 - 40 IU/L '24 25 23  ' ALT 0 - 44 IU/L '18 21 22  ' Alk Phosphatase 44 - 121 IU/L 91 94 86  Total Bilirubin 0.0 - 1.2 mg/dL 0.6 0.5 0.4  Bilirubin, Direct <=0.2 mg/dL - - -    Lab Results  Component Value Date/Time   TSH 1.230 11/11/2018 08:15 AM   TSH 0.979 07/25/2018 02:49 PM    CBC Latest Ref Rng & Units 04/20/2021 03/29/2021 11/11/2018  WBC 4.0 -  10.5 K/uL 11.6(H) 6.2 6.4  Hemoglobin 13.0 - 17.0 g/dL 11.8(L) 14.1 14.7  Hematocrit 39.0 - 52.0 % 36.3(L) 41.9 43.2  Platelets 150 - 400 K/uL 194 242 237    No results found for: VD25OH  Clinical ASCVD: {YES/NO:21197} The ASCVD Risk score (Arnett DK, et al., 2019) failed to calculate for the following reasons:   The 2019 ASCVD risk score is only valid for ages 57 to 9    Depression screen PHQ 2/9 03/29/2021 01/05/2021 07/06/2020  Decreased Interest 0 0 0  Down, Depressed, Hopeless 0 0 0  PHQ - 2 Score 0 0 0     ***Other: (CHADS2VASc if Afib, MMRC or CAT for COPD, ACT, DEXA)  Social History   Tobacco Use  Smoking Status Former   Types: Cigars  Smokeless Tobacco Never  Tobacco Comments   as a teenager   BP Readings from Last 3 Encounters:  10/16/21 122/72  08/02/21 138/81  06/27/21 132/74   Pulse Readings from Last 3 Encounters:  10/16/21 61  08/02/21 78  06/27/21 (!) 59   Wt Readings from Last 3 Encounters:  10/16/21 179 lb 6.4 oz (81.4 kg)  08/02/21 171 lb (77.6 kg)  06/27/21 175 lb 3.2 oz (79.5 kg)   BMI Readings from Last 3 Encounters:  10/16/21 24.33 kg/m  08/02/21 23.19 kg/m  06/27/21 23.76 kg/m    Assessment/Interventions: Review of patient past medical history, allergies, medications, health status, including review of consultants reports, laboratory and other test data, was performed as part of comprehensive evaluation and provision of chronic care management services.   SDOH:  (Social Determinants of Health) assessments and interventions performed: {yes/no:20286}  SDOH Screenings   Alcohol Screen: Not on file  Depression (PHQ2-9): Low Risk    PHQ-2 Score: 0  Financial Resource Strain: Not on file  Food Insecurity: Not on file  Housing: Not on file  Physical Activity: Not on file  Social Connections: Not on file  Stress: Not on file  Tobacco Use: Medium Risk   Smoking Tobacco Use: Former   Smokeless Tobacco Use: Never   Passive Exposure: Not  on file  Transportation Needs: Not on file    [place]  Allergies  Allergen Reactions   Codeine     REACTION: violent vomiting    Medications Reviewed Today     Reviewed by Ludwig Clarks, DO (Physician) on 10/16/21 at 1124  Med List Status: <None>   Medication Order Taking? Sig Documenting Provider Last Dose Status Informant  acetaminophen (TYLENOL) 650 MG CR tablet [id number] Yes Take 650 mg by mouth every 8 (eight) hours as needed for pain. [provider] Taking Active Self  amLODipine (NORVASC) 5 MG tablet [id number] Yes Take 1 tablet (5 mg total) by mouth daily. [name], MD Taking Active   cholecalciferol (VITAMIN D) 1000 units tablet [id number] Yes Take 1,000 Units by mouth daily. [provider] Taking Active Self  Cyanocobalamin (VITAMIN B 12 PO) [id number]  Yes Take 1,000 mcg by mouth daily. [provider] Taking Active Self  finasteride (PROSCAR) 5 MG tablet [id number] Yes Take 5 mg by mouth daily. [provider] Taking Active Self           Med Note Wilmon Pali, MELISSA R   Wed Apr 05, 2021 12:16 PM)    losartan (COZAAR) 50 MG tablet [id number] Yes Take 1 tablet (50 mg total) by mouth daily. [name], MD Taking Active Self  metoprolol succinate (TOPROL-XL) 25 MG 24 hr tablet [id number] Yes TAKE ONE TABLET DAILY [name], MD Taking Active   Multiple Vitamins-Minerals (EMERGEN-C IMMUNE PLUS) PACK [id number] Yes Take 1 tablet by mouth 2 (two) times daily. Tysinger, Camelia Eng, PA-C Taking Active   primidone (MYSOLINE) 50 MG tablet [id number] Yes TAKE ONE TABLET AT BEDTIME Tat, Eustace Quail, DO Taking Active   rosuvastatin (CRESTOR) 20 MG tablet [id number] Yes Take 1 tablet (20 mg total) by mouth daily. [name], MD Taking Active             Patient Active Problem List   Diagnosis Date Noted   Syncope and collapse    Primary hypertension    Primary osteoarthritis of left hip 04/18/2021   Mild neurocognitive disorder  09/11/2019   Dysphagia 12/28/2015   Erectile dysfunction 12/07/2013   Insomnia 12/15/2012   IFG (impaired fasting glucose) 12/10/2012   Benign familial tremor 08/29/2012   Pure hypercholesterolemia 12/13/2011    Immunization History  Administered Date(s) Administered   Fluad Quad(high Dose 65+) 07/16/2019, 07/06/2020, 10/02/2021   Influenza Split 07/18/2011, 08/28/2012   Influenza, High Dose Seasonal PF 08/19/2014, 06/22/2015, 08/20/2016, 08/20/2017   Influenza,inj,Quad PF,6+ Mos 07/06/2013   Influenza-Unspecified 07/25/2018   PFIZER(Purple Top)SARS-COV-2 Vaccination 11/18/2019, 12/07/2019, 08/02/2020, 03/29/2021   Pneumococcal Conjugate-13 06/14/2014   Pneumococcal Polysaccharide-23 12/13/2011   Tdap 03/02/2011   Zoster, Live 03/25/2014    Conditions to be addressed/monitored:  Hypertension, Hyperlipidemia, BPH, and Tremor  There are no care plans that you recently modified to display for this patient.   Current Barriers:  {pharmacybarriers:24917}  Pharmacist Clinical Goal(s):  Patient will {PHARMACYGOALCHOICES:24921} through collaboration with PharmD and provider.   Interventions: 1:1 collaboration with [name], MD regarding development and update of comprehensive plan of care as evidenced by provider attestation and co-signature Inter-disciplinary care team collaboration (see longitudinal plan of care) Comprehensive medication review performed; medication list updated in electronic medical record  Hypertension (BP goal <130/80) -Controlled -Current treatment: Amlodipine 5 mg 1 tablet daily Losartan 50 mg 1 tablet daily Metoprolol succinate 25 mg 1 tablet daily -Medications previously tried: ***  -Current home readings: *** -Current dietary habits: *** -Current exercise habits: *** -{ACTIONS;DENIES/REPORTS:21021675::"Denies"} hypotensive/hypertensive symptoms -Educated on {CCM BP Counseling:25124} -Counseled to monitor BP at home ***, document, and provide log  at future appointments -{CCMPHARMDINTERVENTION:25122}  Lab Results  Component Value Date   CHOL 144 03/29/2021   HDL 48 03/29/2021   LDLCALC 77 03/29/2021   TRIG 101 03/29/2021   CHOLHDL 3.0 03/29/2021    Hyperlipidemia: (LDL goal < 100) -{US controlled/uncontrolled:25276} -Current treatment: Rosuvastatin 20 mg 1 tablet daily -Medications previously tried: simvastatin (drug interaction with amlodipine)  -Current dietary patterns: *** -Current exercise habits: *** -Educated on {CCM HLD Counseling:25126} -{CCMPHARMDINTERVENTION:25122}  BPH (Goal: ***) -{US controlled/uncontrolled:25276} -Current treatment  Finasteride 5 mg 1 tablet daily -Medications previously tried: ***  -{CCMPHARMDINTERVENTION:25122}  Tremor (Goal: ***) -{US controlled/uncontrolled:25276} -Current treatment  Primidone 50 mg 1 tablet at bedtime -Medications previously tried: ***  -{CCMPHARMDINTERVENTION:25122}  Health Maintenance -Vaccine gaps: shingrix, tetanus, COVID booster -Current therapy:  Acetaminophen 650 mg 1 tablet as needed Vitamin D 1000 units daily Vitamin B12 1000 mcg daily Emergen C daily -Educated on {ccm supplement counseling:25128} -{CCM Patient satisfied:25129} -{CCMPHARMDINTERVENTION:25122}  Patient Goals/Self-Care Activities Patient will:  - {pharmacypatientgoals:24919}  Follow Up Plan: {CM FOLLOW UP ZOXW:96045}   Medication Assistance: {MEDASSISTANCEINFO:25044}  Compliance/Adherence/Medication fill history: Care Gaps: Shingrix, COVID booster, tetanus BP- 122/72 (10/16/21)  Star-Rating Drugs: Losartan 50 mg - Last filled 10/13/21 90 DS at [facility] Rosuvastatin 20 mg - Last filled 10/13/21 90 DS at [facility]  Patient's preferred pharmacy is:  [facility], [place] - [address] [place] [address] Phone: [phone] Fax: [phone]  Uses pill box? {Yes or If no, why not?:20788} Pt endorses ***%  compliance  We discussed: {Pharmacy options:24294} Patient decided to: {[facility]  Care Plan and Follow Up Patient Decision:  {FOLLOWUP:24991}  Plan: {CM FOLLOW UP ONGE:95284}  [name], PharmD, [facility] Clinical Pharmacist {MP practice sites:26434} [phone]

## 2021-12-04 NOTE — Progress Notes (Signed)
Patient Missed initial appointment with [name] on 11/30/21 Call to offer to reschedule, was unable to reach left voice mail with my contact information to return call if he would like to do so.  [place] Clinical Pharmacist Assistant [phone]

## 2021-12-14 ENCOUNTER — Ambulatory Visit (INDEPENDENT_AMBULATORY_CARE_PROVIDER_SITE_OTHER): Payer: PPO | Admitting: Psychology

## 2021-12-14 ENCOUNTER — Ambulatory Visit: Payer: PPO

## 2021-12-14 ENCOUNTER — Other Ambulatory Visit: Payer: Self-pay

## 2021-12-14 ENCOUNTER — Encounter: Payer: Self-pay | Admitting: Psychology

## 2021-12-14 DIAGNOSIS — G250 Essential tremor: Secondary | ICD-10-CM

## 2021-12-14 DIAGNOSIS — R4189 Other symptoms and signs involving cognitive functions and awareness: Secondary | ICD-10-CM

## 2021-12-14 DIAGNOSIS — G3184 Mild cognitive impairment, so stated: Secondary | ICD-10-CM | POA: Diagnosis not present

## 2021-12-14 NOTE — Progress Notes (Signed)
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 average. Retention rates were 39% across a story learning task, 14% across a list learning task, and 100% (raw score of 3) across a shape learning task. Performance across recognition tasks was variable, ranging from the exceptionally low to average normative ranges, suggesting some evidence for information consolidation.   Results of emotional screening instruments suggested that recent symptoms of generalized anxiety were in the minimal range, while symptoms of depression were within normal limits. A screening instrument assessing recent sleep quality suggested the presence of minimal sleep dysfunction.  Tables of Scores:   Note: This summary of test scores accompanies the interpretive report and should not be considered in isolation without reference to the appropriate sections in the text. Descriptors are based on appropriate normative data and may be adjusted based on clinical judgment. Terms such as "Within Normal Limits" and "Outside Normal Limits" are used when a more specific description of the test score cannot be determined. Descriptors refer to the  current evaluation only.          Percentile - Normative Descriptor > 98 - Exceptionally High 91-97 - Well Above Average 75-90 - Above Average 25-74 - Average 9-24 - Below Average 2-8 - Well Below Average < 2 - Exceptionally Low         Validity:     DESCRIPTOR   November 2020 November 2021 Current    Dot Counting Test: --- --- --- --- Within Normal Limits  HVLT-R Recognition Discrimination Index: --- --- --- --- Outside Normal Limits  BVMT-R Retention Percentage: --- --- --- --- Within Normal Limits  D-KEFS Color Word Effort Index: --- --- --- --- Within Normal Limits         Orientation:        Raw Score Raw Score Raw Score Percentile   NAB Orientation, Form 1 '29/29 26/29 27/29 ' --- ---         Cognitive Screening:        Raw Score Raw Score Raw Score Percentile   SLUMS: --- 21/30 16/30 --- ---         Intellectual Functioning:        Standard Score Standard Score Standard Score Percentile   Test of Premorbid Functioning: 112 109 113 81 Above Average         Memory:       Wechsler Memory Scale (WMS-IV):                       Raw Score Raw Score (Scaled Score) Raw Score (Scaled Score) Percentile     Logical Memory I 33/53 30/53 (11) 26/53 (9) 37 Average    Logical Memory II 10/39 17/39 (11) 7/39 (7) 16 Below Average    Logical Memory Recognition '15/23 15/23 16/23 ' 26-50 Average  Hopkins Verbal Learning Test (HVLT-R), Form 1: Raw Score Raw Score (T Score) Raw Score (T Score) Percentile     Total Trials 1-3 20/36 18/36 (38) 19/36 (40) 16 Below Average    Delayed Recall 2/12 2/12 (35) 1/12 (32) 4 Well Below Average    Recognition Discrimination Index 4 9 (46) 5 (26) 1 Exceptionally Low      True Positives '8 12 10 ' --- ---      False Positives '4 3 5 ' --- ---  *From Riki Sheer (2016)              Brief Visuospatial Memory Test (BVMT-R), Form 1: Raw Score Raw Score (T Score) Raw Score (T Score) Percentile     Total Trials 1-3 14/36 7/36 (39) 3/36 (33) 5 Well Below Average     Delayed Recall 2/12 2/12 (38) 3/12 (41) 18 Below Average    Recognition Discrimination Index 6 5 (48) 3 (30) 2 Well Below Average      Recognition Hits 6/6 5/6 (44) 3/6 (19) <1 Exceptionally Low      False Positive Errors 0 0 (54) 0 (54) 66 Average  *From Riki Sheer (2016)              Attention/Executive Function:       Trail Making Test (TMT): Raw Score Raw Score (Scaled Score) Raw Score (Scaled Score) Percentile     Part A 57 secs.,  0 errors 81 secs.,  0 errors (6) 69 secs.,  0 errors (7) 16 Below Average    Part B 124 secs.,  0 errors 133 secs.,  0 errors (10) 147 secs.,  0 errors (9) 37 Average  *Based on [employer] Older Normative Studies (MOANS)              Symbol Digit Modalities Test (SDMT): Raw Score Raw Score (Z-Score) Raw Score (Z-Score) Percentile     Oral 30 23 (-2.2) 19 (-2.5) 1 Exceptionally Low         NAB Attention Module, Form 1: T Score T Score T Score Percentile     Digits Forward 59 60 51 54 Average    Digits Backwards 50 41 41 18 Below Average         D-KEFS Color-Word Interference Test: Raw Score Raw Score (Scaled Score) Raw Score (Scaled Score) Percentile     Color Naming 40 secs. 40 secs. (8) 45 secs. (6) 9 Below Average    Word Reading 28 secs. 29 secs. (9) 30 secs. (8) 25 Average    Inhibition 145 secs. 145 secs. (4) 156 secs. (2) <1 Exceptionally Low      Total Errors 5 errors 3 errors (10) 6 errors (8) 25 Average    Inhibition/Switching 106 secs. 127 secs. (7) 106 secs. (9) 37 Average      Total Errors 3 errors 4 errors (10) 1 error (13) 84 Above Average         Language:       Verbal Fluency Test: Raw Score Raw Score Raw Score (T Score) Percentile     Phonemic Fluency (FAS) 29 37 40 (50) 50 Average    Animal Fluency '7 13 8 ' (24) <1 Exceptionally Low          NAB Language Module, Form 1: T Score T Score T Score Percentile     Auditory Comprehension --- 54 39 14 Below Average    Naming 30/31 (58) 30/31 (62) 30/31 (62) 88 Above Average  Visuospatial/Visuoconstruction:        Raw Score Raw Score Raw Score Percentile   Clock Drawing: 5/10 8/10 5/10 --- Impaired         NAB Spatial Module, Form 1: T Score T Score T Score Percentile     Visual Discrimination 38 43 39 14 Below Average    Design Construction 48 48 46 34 Average          Scaled Score Scaled Score Scaled Score Percentile   WAIS-IV Matrix Reasoning: '11 12 12 ' 75 Above Average         Mood and Personality:        Raw Score Raw Score Raw Score Percentile   Geriatric Depression Scale: '9 5 4 ' --- Within Normal Limits  Geriatric Anxiety Scale: '5 4 6 ' --- Minimal    Somatic '3 3 5 ' --- Minimal    Cognitive '2 1 1 ' --- Minimal    Affective 0 0 0 --- Minimal         Additional Questionnaires:        Raw Score Raw Score Raw Score Percentile   PROMIS Sleep Disturbance Questionnaire: '17 14 13 ' --- None to Slight   Informed Consent and Coding/Compliance:   The current evaluation represents a clinical evaluation for the purposes previously outlined by the referral source and is in no way reflective of a forensic evaluation.   Mr. Huttner was provided with a verbal description of the nature and purpose of the present neuropsychological evaluation. Also reviewed were the foreseeable risks and/or discomforts and benefits of the procedure, limits of confidentiality, and mandatory reporting requirements of this provider. The patient was given the opportunity to ask questions and receive answers about the evaluation. Oral consent to participate was provided by the patient.   This evaluation was conducted by Christia Reading, Ph.D., ABPP-CN, board certified clinical neuropsychologist. Mr. Fuelling completed a clinical interview with Dr. Melvyn Novas, billed as one unit [phone], and 140 minutes of cognitive testing and scoring, billed as one unit [phone] and four additional units [id number]. Psychometrist Cruzita Lederer, B.S., assisted Dr. Melvyn Novas with test administration and scoring procedures. As a separate  and discrete service, Dr. Melvyn Novas spent a total of 160 minutes in interpretation and report writing billed as one unit [phone] and two units [id number].

## 2021-12-14 NOTE — Progress Notes (Signed)
° °  Psychometrician Note   Cognitive testing was administered to Eric Simmons. by Cruzita Lederer, B.S. (psychometrist) under the supervision of Dr. Christia Reading, Ph.D., licensed psychologist on 12/14/2021. [name] did not appear overtly distressed by the testing session per behavioral observation or responses across self-report questionnaires. Rest breaks were offered.    The battery of tests administered was selected by Dr. Christia Reading, Ph.D. with consideration to [name]'s current level of functioning, the nature of his symptoms, emotional and behavioral responses during interview, level of literacy, observed level of motivation/effort, and the nature of the referral question. This battery was communicated to the psychometrist. Communication between Dr. Christia Reading, Ph.D. and the psychometrist was ongoing throughout the evaluation and Dr. Christia Reading, Ph.D. was immediately accessible at all times. Dr. Christia Reading, Ph.D. provided supervision to the psychometrist on the date of this service to the extent necessary to assure the quality of all services provided.    Eric Simmons. will return within approximately 1-2 weeks for an interactive feedback session with Dr. Melvyn Novas at which time his test performances, clinical impressions, and treatment recommendations will be reviewed in detail. [name] understands he can contact our office should he require our assistance before this time.  A total of 140 minutes of billable time were spent face-to-face with [name] by the psychometrist. This includes both test administration and scoring time. Billing for these services is reflected in the clinical report generated by Dr. Christia Reading, Ph.D.  This note reflects time spent with the psychometrician and does not include test scores or any clinical interpretations made by Dr. Melvyn Novas. The full report will follow in a separate note.

## 2021-12-21 ENCOUNTER — Telehealth: Payer: Self-pay | Admitting: Neurology

## 2021-12-21 ENCOUNTER — Other Ambulatory Visit: Payer: Self-pay

## 2021-12-21 ENCOUNTER — Ambulatory Visit (INDEPENDENT_AMBULATORY_CARE_PROVIDER_SITE_OTHER): Payer: PPO | Admitting: Psychology

## 2021-12-21 DIAGNOSIS — G3184 Mild cognitive impairment, so stated: Secondary | ICD-10-CM | POA: Diagnosis not present

## 2021-12-21 NOTE — Progress Notes (Signed)
° °  Neuropsychology Feedback Session [name]. [facility]  Reason for Referral:   Eric Simmons. is a 81 y.o. right-handed Caucasian male referred by Alonza Bogus, D.O., to characterize his current cognitive functioning and assist with diagnostic clarity and treatment planning in the context of subjective cognitive dysfunction, neuroimaging concerning for degenerative illness, and prior diagnosis of a mild neurocognitive disorder.   Feedback:   [name] completed a comprehensive neuropsychological evaluation on 12/14/2021. Please refer to that encounter for the full report and recommendations. Briefly, results suggested impairment surrounding semantic fluency, as well as additional performance variability and/or relative weakness across processing speed and all aspects of learning and memory. Relative to [name]'s past evaluations, mild decline was seen in several domains. Most prominent was essentially all aspects of list and shape-based memory tasks, as well as delayed retrieval aspects of a story-based task. He also exhibited greater difficulty across response inhibition, while much milder declines were seen across processing speed and cognitive flexibility. Performances were generally stable across attention/concentration, response inhibition, expressive language, and visuospatial abilities. Past neuroimaging suggesting disproportionate mesial temporal lobe volume loss is a known risk factor for Alzheimer's disease, as can be the presence of superficial siderosis. Since 2020, there has been steady, documented decline across list and shape-based memory tasks, both in terms of the acquisition of information and its retention after time has passed which would also increase the risk for a memory-based neurodegenerative illness. However, story-based information has remained somewhat resilient to significant decline thus far. While consistent impairment in  semantic fluency is also a common finding in Alzheimer's disease, confrontation naming has also remained resilient to decline over the years. While the potential for a quite slowly progressing case of Alzheimer's disease remains plausible, he has yet to fully fall in line with typical patterns of impairment across testing.   [name] was accompanied b his wife during the current feedback session. Content of the current session focused on the results of his neuropsychological evaluation. [name] was given the opportunity to ask questions and his questions were answered. He was encouraged to reach out should additional questions arise. A copy of his report was provided at the conclusion of the visit.      35 minutes were spent conducting the current feedback session with [name], billed as one unit [phone].

## 2021-12-21 NOTE — Telephone Encounter (Signed)
Called patient and will send notes to Dr. Carles Collet

## 2021-12-21 NOTE — Telephone Encounter (Signed)
Pt came in for neuropsych testing. They have an appt with Eric Simmons on 11/01/22 and him and his wife wanted to see if they could reschedule with Dr. Carles Simmons? Pt's wife said they had to schedule with Eric Simmons due to Dr. Carles Simmons not having any openings until then.

## 2021-12-21 NOTE — Telephone Encounter (Signed)
Called patient back he is anxious to get started on the memory medication mentioned to him during neurocognitive testing by Dr. Melvyn Novas. Patient would like to move appointment up so that they can see [name] sooner rather than next year. I did explain that Dr. Carles Collet consults with all the physicians and providers in our office .  [name] is running our memory clinic and will be the provider Dr. Carles Collet is recommending that they see for the memory issues he is facing at this time. Patient completely understood and agrees to this. I told patient I would send up front for scheduling

## 2021-12-26 ENCOUNTER — Encounter: Payer: Self-pay | Admitting: Cardiology

## 2021-12-26 ENCOUNTER — Other Ambulatory Visit: Payer: Self-pay

## 2021-12-26 ENCOUNTER — Ambulatory Visit: Payer: PPO | Admitting: Cardiology

## 2021-12-26 VITALS — BP 130/70 | HR 64 | Ht 72.0 in | Wt 181.4 lb

## 2021-12-26 DIAGNOSIS — R072 Precordial pain: Secondary | ICD-10-CM | POA: Diagnosis not present

## 2021-12-26 DIAGNOSIS — E7800 Pure hypercholesterolemia, unspecified: Secondary | ICD-10-CM

## 2021-12-26 DIAGNOSIS — I10 Essential (primary) hypertension: Secondary | ICD-10-CM

## 2021-12-26 LAB — BASIC METABOLIC PANEL
BUN/Creatinine Ratio: 15 (ref 10–24)
BUN: 17 mg/dL (ref 8–27)
CO2: 24 mmol/L (ref 20–29)
Calcium: 9.5 mg/dL (ref 8.6–10.2)
Chloride: 102 mmol/L (ref 96–106)
Creatinine, Ser: 1.16 mg/dL (ref 0.76–1.27)
Glucose: 98 mg/dL (ref 70–99)
Potassium: 4.7 mmol/L (ref 3.5–5.2)
Sodium: 138 mmol/L (ref 134–144)
eGFR: 63 mL/min/{1.73_m2} (ref >59)

## 2021-12-26 MED ORDER — METOPROLOL TARTRATE 25 MG PO TABS: ORAL_TABLET | ORAL | 0 refills | Status: DC

## 2021-12-26 NOTE — Addendum Note (Signed)
Addended by: Loren Racer on: 12/26/2021 10:52 AM   Modules accepted: Orders

## 2021-12-26 NOTE — Progress Notes (Signed)
Cardiology Office Note    Date:  12/26/2021   ID:  Eric Simmons., DOB 09-04-1941, MRN [id number]  PCP:  Eric Ohara, MD  Cardiologist:  Eric Him, MD   Chief Complaint  Patient presents with   Follow-up    Chest pain, SOB, HTN    History of Present Illness:  Eric Simmons. is a 81 y.o. male with a hx of HLD,  DOE and chest tightness working in the yard and walking. His father died at 7 of a massive MI. He underwent nuclear stress test 03/2020 showing no ischemia and normal LVF.  2D echo showed normal LVF and mildly leaky MV.   2D echo done 04/17/2020 showed normal LV function with mild MR.  He is here today for followup and is doing well.  He has had some mild chest pressure when he walks at the [facility] and if he slows down it goes away.  He says that he thinks the discomfort has gotten more frequent from what it was in the past. He denies any SOB, DOE, PND, orthopnea, LE edema, dizziness, palpitations or syncope. He is compliant with his meds and is tolerating meds with no SE.     Past Medical History:  Diagnosis Date   Adenomatous colon polyp 03/2005   tubular adenoma   Aortic atherosclerosis (HCC)    BCC (basal cell carcinoma), face    left face, R ear (04/2014), nose 05/2016   Benign familial tremor 08/29/2012   Erectile dysfunction    Hemorrhoids    internal and external   Hip arthritis    left   IFG (impaired fasting glucose) 12/10/2012   Glucose 111 11/2012    Insomnia    Mild mitral regurgitation    Mild neurocognitive disorder 09/11/2019   Pseudogout of knee 07/2003   left   Pulmonary nodule    Pure hypercholesterolemia    Syncope     Past Surgical History:  Procedure Laterality Date   COLONOSCOPY  6/06, 05/2010   Dr. [name]   MOHS SURGERY  6/07   L face, Dr. Link Snuffer   POLYPECTOMY     TONSILLECTOMY     TOTAL HIP ARTHROPLASTY Left 04/18/2021   Procedure: LEFT TOTAL HIP ARTHROPLASTY ANTERIOR APPROACH;  Surgeon: Melrose Nakayama, MD;  Location: [facility];  Service: Orthopedics;  Laterality: Left;   VASECTOMY      Current Medications: Current Meds  Medication Sig   acetaminophen (TYLENOL) 650 MG CR tablet Take 650 mg by mouth every 8 (eight) hours as needed for pain.   amLODipine (NORVASC) 5 MG tablet Take 1 tablet (5 mg total) by mouth daily.   cholecalciferol (VITAMIN D) 1000 units tablet Take 1,000 Units by mouth daily.   finasteride (PROSCAR) 5 MG tablet Take 5 mg by mouth daily.   Glucosamine Sulfate 500 MG TABS Take 500 mg by mouth daily.   losartan (COZAAR) 50 MG tablet Take 1 tablet (50 mg total) by mouth daily.   metoprolol succinate (TOPROL-XL) 25 MG 24 hr tablet TAKE ONE TABLET DAILY   primidone (MYSOLINE) 50 MG tablet TAKE ONE TABLET AT BEDTIME   rosuvastatin (CRESTOR) 20 MG tablet Take 1 tablet (20 mg total) by mouth daily.   tiZANidine (ZANAFLEX) 4 MG tablet Take 4 mg by mouth daily.    Allergies:   Codeine   Social History   Socioeconomic History   Marital status: Married    Spouse name: Not on file   Number of children:  2   Years of education: 16   Highest education level: Bachelor's degree (e.g., BA, AB, BS)  Occupational History   Occupation: retired  Tobacco Use   Smoking status: Former    Types: Cigars   Smokeless tobacco: Never   Tobacco comments:    as a teenager  Vaping Use   Vaping Use: Never used  Substance and Sexual Activity   Alcohol use: Yes    Alcohol/week: 0.0 standard drinks    Comment: 5-6 ounces of Eric Simmons wine or beer 6-7 x/week    Drug use: No   Sexual activity: Yes  Other Topics Concern   Not on file  Social History Narrative   Widowed 2013 (after wife's long battle with MS).  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  Children live in [place], [place] and [place].   Married March 2016 Eric Simmons)   Social Determinants of Health   Financial Resource Strain:  Not on file  Food Insecurity: Not on file  Transportation Needs: Not on file  Physical Activity: Not on file  Stress: Not on file  Social Connections: Not on file     Family History:  The patient's family history includes Cancer (age of onset: 92) in his mother; Colon cancer in his mother; Colon polyps in his mother; Diabetes in his mother; Heart disease in his brother, father, and son; Hyperlipidemia in his brother, brother, brother, son, and son; Hypertension in his father; Sleep apnea in his son; Syncope episode in his son; Tremor in his brother and brother.   ROS:   Please see the history of present illness.    ROS All other systems reviewed and are negative.  No flowsheet data found.     PHYSICAL EXAM:   VS:  BP 130/70    Pulse 64    Ht 6' (1.829 m)    Wt 181 lb 6.4 oz (82.3 kg)    SpO2 96%    BMI 24.60 kg/m    GEN: Well nourished, well developed in no acute distress HEENT: Normal NECK: No JVD; No carotid bruits LYMPHATICS: No lymphadenopathy CARDIAC:RRR, no murmurs, rubs, gallops RESPIRATORY:  Clear to auscultation without rales, wheezing or rhonchi  ABDOMEN: Soft, non-tender, non-distended MUSCULOSKELETAL:  No edema; No deformity  SKIN: Warm and dry NEUROLOGIC:  Alert and oriented x 3 PSYCHIATRIC:  Normal affect    Wt Readings from Last 3 Encounters:  12/26/21 181 lb 6.4 oz (82.3 kg)  10/16/21 179 lb 6.4 oz (81.4 kg)  08/02/21 171 lb (77.6 kg)      Studies/Labs Reviewed:   EKG:  EKG is not ordered today   Recent Labs: 03/29/2021: ALT 18 04/20/2021: BUN 15; Creatinine, Ser 1.05; Hemoglobin 11.8; Platelets 194; Potassium 4.2; Sodium 131   Lipid Panel    Component Value Date/Time   CHOL 144 03/29/2021 0816   TRIG 101 03/29/2021 0816   HDL 48 03/29/2021 0816   CHOLHDL 3.0 03/29/2021 0816   CHOLHDL 2.9 10/30/2017 0944   VLDL 17 08/20/2016 1051   LDLCALC 77 03/29/2021 0816   LDLCALC 80 10/30/2017 0944    Additional studies/ records that were reviewed today  include:  2D echo 03/2020 IMPRESSIONS     1. Left ventricular ejection fraction, by estimation, is 60 to 65%. The  left ventricle has normal function. The left ventricle has no regional  wall motion abnormalities. Left ventricular diastolic parameters  are  indeterminate.   2. Right ventricular systolic function is normal. The right ventricular  size is normal. There is normal pulmonary artery systolic pressure. The  estimated right ventricular systolic pressure is 29.9 mmHg.   3. The mitral valve is grossly normal. Mild mitral valve regurgitation.  No evidence of mitral stenosis.   4. The aortic valve is tricuspid. Aortic valve regurgitation is not  visualized. Mild aortic valve sclerosis is present, with no evidence of  aortic valve stenosis.   5. The inferior vena cava is normal in size with greater than 50%  respiratory variability, suggesting right atrial pressure of 3 mmHg.   Cardiac Cath 03/2020 Study Highlights    Nuclear stress EF: 56%. There was no ST segment deviation noted during stress. No T wave inversion was noted during stress. The study is normal. This is a low risk study. The left ventricular ejection fraction is normal (55-65%).   1. There are reduced counts in the mid inferior segment on rest and stress imaging consistent with diaphragm attenuation.  2. This is a normal study without evidence of ischemia or infarction.  3. Normal LVEF, 56%.  4. This is a low-risk study.     ASSESSMENT:    1. Precordial pain   2. Primary hypertension   3. Pure hypercholesterolemia      PLAN:  In order of problems listed above:  1.  Hx of SOB/Chest pain -felt possibly related to poorly controlled HTN and diastolic dysfunction  -CP and SOB resolved on Toprol and getting BP controlled  -nuclear stress test showed no ischemia in 03/2020 -2D echo showed normal LVF in June 2021 -He is now having chest discomfort again when he walks at the gym that he feels has become more  frequent -I will get a coronary CTA to assess for CAD  2.  HTN -BP is well controlled on exam today -Continue prescription drug management with amlodipine 5mg  daily, losartan 50 mg daily and Toprol-XL 25 mg daily with as needed refills -Check bmet today  3.  HLD -followed by PCP -LDL goal < 100 -continue Crestor 20mg  daily   Medication Adjustments/Labs and Tests Ordered: Current medicines are reviewed at length with the patient today.  Concerns regarding medicines are outlined above.  Medication changes, Labs and Tests ordered today are listed in the Patient Instructions below.  There are no Patient Instructions on file for this visit.   Signed, Eric Him, MD  12/26/2021 10:40 AM    [facility] [place], [place], [place]  [address] Phone: [phone]; Fax: [phone]

## 2021-12-26 NOTE — Patient Instructions (Addendum)
Medication Instructions:  Your physician recommends that you continue on your current medications as directed. Please refer to the Current Medication list given to you today.  *If you need a refill on your cardiac medications before your next appointment, please call your pharmacy*   Lab Work: BMET today  If you have labs (blood work) drawn today and your tests are completely normal, you will receive your results only by: [place] (if you have MyChart) OR A paper copy in the mail If you have any lab test that is abnormal or we need to change your treatment, we will call you to review the results.   Testing/Procedures: Your physician recommends that you have a Coronary CT performed.  See next 2 pages for instructions.   Follow-Up: At [facility], you and your health needs are our priority.  As part of our continuing mission to provide you with exceptional heart care, we have created designated Provider Care Teams.  These Care Teams include your primary Cardiologist (physician) and Advanced Practice Providers (APPs -  Physician Assistants and Nurse Practitioners) who all work together to provide you with the care you need, when you need it.  We recommend signing up for the patient portal called "MyChart".  Sign up information is provided on this After Visit Summary.  MyChart is used to connect with patients for Virtual Visits (Telemedicine).  Patients are able to view lab/test results, encounter notes, upcoming appointments, etc.  Non-urgent messages can be sent to your provider as well.   To learn more about what you can do with MyChart, go to [email or web address].    Your next appointment:   1 year(s)  The format for your next appointment:   In Person  Provider:   [name], MD     Other Instructions   Your cardiac CT will be scheduled at one of the below locations:   [facility] [address] [place], [place] [address] [phone]  [place] [address] [place], [place] [address] [phone]  If scheduled at [facility], please arrive at the [facility] and Children's Entrance (Entrance C2) of [facility] 30 minutes prior to test start time. You can use the FREE valet parking offered at entrance C (encouraged to control the heart rate for the test)  Proceed to the [facility] Radiology Department (first floor) to check-in and test prep.  All radiology patients and guests should use entrance C2 at [facility], accessed from [facility], even though the hospital's physical address listed is [address].    If scheduled at [facility], please arrive 15 mins early for check-in and test prep.  Please follow these instructions carefully (unless otherwise directed):  Hold all erectile dysfunction medications at least 3 days (72 hrs) prior to test.  On the Night Before the Test: Be sure to Drink plenty of water. Do not consume any caffeinated/decaffeinated beverages or chocolate 12 hours prior to your test. Do not take any antihistamines 12 hours prior to your test.   On the Day of the Test: Drink plenty of water until 1 hour prior to the test. Do not eat any food 4 hours prior to the test. You may take your regular medications prior to the test.  Take metoprolol Tartrate (Lopressor) two hours prior to test. HOLD Furosemide/Hydrochlorothiazide morning of the test.       After the Test: Drink plenty of  water. After receiving IV contrast, you may experience a mild flushed feeling. This is normal. On occasion, you may experience a mild rash up to 24 hours after the test. This is not dangerous. If this occurs, you can take Benadryl 25 mg and increase your fluid intake. If you experience trouble breathing, this can be serious. If it is severe call 911 IMMEDIATELY. If it is mild, please  call our office. If you take any of these medications: Glipizide/Metformin, Avandament, Glucavance, please do not take 48 hours after completing test unless otherwise instructed.  We will call to schedule your test 2-4 weeks out understanding that some insurance companies will need an authorization prior to the service being performed.   For non-scheduling related questions, please contact the cardiac imaging nurse navigator should you have any questions/concerns: Marchia Bond, Cardiac Imaging Nurse Navigator Gordy Clement, Cardiac Imaging Nurse Navigator [facility] Direct Office Dial: [phone]   For scheduling needs, including cancellations and rescheduling, please call [place], [phone].

## 2022-01-02 ENCOUNTER — Other Ambulatory Visit: Payer: Self-pay

## 2022-01-02 ENCOUNTER — Encounter: Payer: Self-pay | Admitting: Physician Assistant

## 2022-01-02 ENCOUNTER — Ambulatory Visit: Payer: PPO | Admitting: Physician Assistant

## 2022-01-02 VITALS — BP 140/81 | HR 74 | Resp 20 | Ht 72.0 in | Wt 180.0 lb

## 2022-01-02 DIAGNOSIS — K117 Disturbances of salivary secretion: Secondary | ICD-10-CM

## 2022-01-02 DIAGNOSIS — R202 Paresthesia of skin: Secondary | ICD-10-CM | POA: Diagnosis not present

## 2022-01-02 DIAGNOSIS — G250 Essential tremor: Secondary | ICD-10-CM

## 2022-01-02 DIAGNOSIS — G3184 Mild cognitive impairment, so stated: Secondary | ICD-10-CM

## 2022-01-02 NOTE — Progress Notes (Signed)
Assessment/Plan:   Eric Simmons. is a very pleasant 81 y.o. year old RH male with  a history of hypertension, hyperlipidemia, anxiety, depression seen today for evaluation of memory loss. Patient has had neurocognitive testing in November, 2020 and November, 2021 and in 11/2021 with Dr. Melvyn Novas.  All of these tests demonstrated mild neurocognitive disorder. MoCA today is 23/30 with only deficiency in delayer recall 0/5.   Mild Cognitive Impairment due to Alzheimer's Disease   Discussed safety both in and out of the home.  Discussed the importance of regular daily schedule to maintain brain function.  Continue to monitor mood with PCP.  Monitor Driving Stay active at least 30 minutes at least 3 times a week.  Naps should be scheduled and should be no longer than 60 minutes and should not occur after 2 PM.  Control cardiovascular risk factors  Mediterranean diet is recommended  Patient declines any antidementia medication at this time.  He has been instructed to contact us, if he feels that the memory is worse, at which time we will reevaluate, and ACHI may be adequate then.  Folllow up in 6  months  1.  Essential Tremor, stable      Continue primidone, 50 mg nightly.  We could increase but he is not ready for that yet.            3.  Hand paresthesias, stable  He has some evidence of PN but discussed hands could be CTS as well.  He wants to hold on EMG/NCS for now.  Discussed wrist splints but he wants to hold on that for now.     4.  Sialorrhea, stable  This is commonly associated with PD.  We talked about treatments.  The patient is not a candidate for oral anticholinergic therapy because of increased risk of confusion and falls.  We again  discussed Botox (type A and B) and 1% atropine drops.  We again discussed that candy like lemon drops can help by stimulating mm of the oropharynx to induce swallowing and they are going to try this first, he is not interested at this time in new  therapies   5.  Follow-up 1 year or sooner if symptoms or memory worsens  Subjective:    The patient is seen in neurologic consultation at the request of [name], MD for the evaluation of memory.  The patient is accompanied by his wife who supplements the history.  The patient feels that the memory "may be a little bit worse, but not that much ".  His main frustration is remembering people's names, this bothers him.  He still tries to monitor taking his level of cognition by attending groups to discuss a variety of topics, including politics, which is very stimulating for him.  They also discussed basketball during the season.  He may have some issues with reading comprehension, and processing which may be slower than prior.  He likes to walk about 2 miles a day, 5 days a week, [facility] [employer] and work on the yard.  He denies repeating himself.  He denies being disoriented when walking into her room, or leaving objects in unusual places.  He denies wandering behavior.  He continues to drive, denies feeling lost.  His mood is good, denies depression or irritability.  He sleeps well, denies vivid dreams, sleepwalking, hallucinations or paranoia.  There are no hygiene concerns.  He is independent of bathing and dressing.  His medications are in a pillbox, and the finances and are on autopay.  His appetite is good, denies trouble swallowing, but he does have sialorrhea, at times bothersome, but he does not wet the pillow as of yet.  He does not cook much.  Denies leaving the stove or the faucet on.  He denies any headaches, double vision, dizziness, focal numbness or tingling, unilateral weakness or anosmia.  No history of seizures.  Denies urine incontinence, retention, constipation or diarrhea.  Denies OSA, alcohol or tobacco.  As for his essential tremor, he reports that they are about the same as prior.  He is on primidone 50 mg nightly.  He does not want to change his dosage at this time, and his  wife agrees as well.   Current prescribed movement disorder medications: Primidone, 50 mg nightly   Neurocognitive Testing 12/14/21 Dr. Melvyn Novas . Briefly, results suggested impairment surrounding semantic fluency, as well as additional performance variability and/or relative weakness across processing speed and all aspects of learning and memory. Relative to Eric Simmons's past evaluations, mild decline was seen in several domains. Most prominent was essentially all aspects of list and shape-based memory tasks, as well as delayed retrieval aspects of a story-based task. He also exhibited greater difficulty across response inhibition, while much milder declines were seen across processing speed and cognitive flexibility. Performances were generally stable across attention/concentration, response inhibition, expressive language, and visuospatial abilities. Past neuroimaging suggesting disproportionate mesial temporal lobe volume loss is a known risk factor for Alzheimer's disease, as can be the presence of superficial siderosis. Since 2020, there has been steady, documented decline across list and shape-based memory tasks, both in terms of the acquisition of information and its retention after time has passed which would also increase the risk for a memory-based neurodegenerative illness. However, story-based information has remained somewhat resilient to significant decline thus far. While consistent impairment in semantic fluency is also a common finding in Alzheimer's disease, confrontation naming has also remained resilient to decline over the years. While the potential for a quite slowly progressing case of Alzheimer's disease remains plausible, he has yet to fully fall in line with typical patterns of impairment across testing.     Allergies  Allergen Reactions   Codeine     REACTION: violent vomiting    Current Outpatient Medications  Medication Instructions   acetaminophen (TYLENOL) 650 mg, Oral,  Every 8 hours PRN   amLODipine (NORVASC) 5 mg, Oral, Daily   cholecalciferol (VITAMIN D) 1,000 Units, Oral, Daily   Cyanocobalamin (VITAMIN B 12 PO) 1,000 mcg, Oral, Daily   finasteride (PROSCAR) 5 mg, Oral, Daily   Glucosamine Sulfate 500 mg, Daily   losartan (COZAAR) 50 mg, Oral, Daily   metoprolol succinate (TOPROL-XL) 25 MG 24 hr tablet TAKE ONE TABLET DAILY   metoprolol tartrate (LOPRESSOR) 25 MG tablet Take one tablet by mouth 2 hours prior to CT   Multiple Vitamins-Minerals (EMERGEN-C IMMUNE PLUS) PACK 1 tablet, Oral, 2 times daily   primidone (MYSOLINE) 50 MG tablet TAKE ONE TABLET AT BEDTIME   rosuvastatin (CRESTOR) 20 mg, Oral, Daily   tiZANidine (ZANAFLEX) 4 mg, Oral, Daily     VITALS:   Vitals:   01/02/22 1000  BP: 140/81  Pulse: 74  Resp: 20  SpO2: 95%  Weight: 180 lb (81.6 kg)  Height: 6' (1.829 m)     PHYSICAL EXAM   HEENT:  Normocephalic, atraumatic. The mucous membranes are moist. The superficial temporal arteries are without ropiness or  tenderness. Cardiovascular: Regular rate and rhythm. Lungs: Clear to auscultation bilaterally. Neck: There are no carotid bruits noted bilaterally.  NEUROLOGICAL: Montreal Cognitive Assessment  01/02/2022 06/18/2019 05/20/2018  Visuospatial/ Executive (0/5) '5 5 5  '$ Naming (0/3) '3 3 3  '$ Attention: Read list of digits (0/2) '2 2 2  '$ Attention: Read list of letters (0/1) '1 1 1  '$ Attention: Serial 7 subtraction starting at 100 (0/3) '3 3 2  '$ Language: Repeat phrase (0/2) '2 2 2  '$ Language : Fluency (0/1) '1 1 1  '$ Abstraction (0/2) '1 1 1  '$ Delayed Recall (0/5) 0 1 0  Orientation (0/6) '5 5 6  '$ Total '23 24 23  '$ Adjusted Score (based on education) - 24 23   MMSE - Mini Mental State Exam 05/08/2018  Orientation to time 5  Orientation to time comments July 11th or 12th  Orientation to Place 5  Registration 3  Attention/ Calculation 5  Recall 2  Language- name 2 objects 2  Language- repeat 1  Language- follow 3 step command 3  Language-  read & follow direction 1  Write a sentence 1  Write a sentence-comments significant tremor  Copy design 1  Total score 29    No flowsheet data found.   Orientation:  Alert and oriented to person, place and time. No aphasia or dysarthria. Fund of knowledge is appropriate. Recent memory impaired and remote memory intact.  Attention and concentration are normal.  Able to name objects and repeat phrases. Delayed recall   Cranial nerves: There is good facial symmetry. Extraocular muscles are intact and visual fields are full to confrontational testing. Speech is fluent and clear. Soft palate rises symmetrically and there is no tongue deviation. Hearing is intact to conversational tone. Tone: Tone is good throughout. Abnormal movement: Mild intention tremor on the right, essentially unchanged from prior. Sensation: Sensation is intact to light touch and pinprick throughout. Vibration is intact at the bilateral big toe.There is no extinction with double simultaneous stimulation. There is no sensory dermatomal level identified. Coordination: There is no cremation with RAM's or FNF bilaterally, including alternating supination and pronation of the forearm, hand opening and closing, finger taps, heel taps and toe taps.. Normal finger to nose  Motor: Strength is 5/5 in the bilateral upper and lower extremities. There is no pronator drift. There are no fasciculations noted. DTR's: Deep tendon reflexes are 2/4 at the bilateral biceps, triceps, brachioradialis, patella and achilles.  Plantar responses are downgoing bilaterally. Gait and Station: The patient is able to ambulate without difficulty.The patient is able to heel toe walk without any difficulty.The patient is able to ambulate in a tandem fashion with good arm swing the patient is able to stand in the Romberg position.    Thank you for allowing [place] the opportunity to participate in the care of this nice patient. Please do not hesitate to contact us for  any questions or concerns.   Total time spent on today's visit was 60 minutes, including both face-to-face time and nonface-to-face time.  Time included that spent on review of records (prior notes available to me/labs/imaging if pertinent), discussing treatment and goals, answering patient's questions and coordinating care.  Cc:  [name], MD  Sharene Butters 01/02/2022 10:41 AM

## 2022-01-02 NOTE — Patient Instructions (Addendum)
It was a pleasure to see you today at our office.  ? ?Recommendations: ? ?Follow up in 1 year or sooner if necessary  ?We discussed Aricept and its side effects, call us when or if memory changes or if you want to consider starting on the medication ? Let us know if you decide you want to increase your primidone for worsening tremor ?Let us know if you want a RX for wrist splint for the hand numbness at night ? We discusssed that candy like lemon drops can help by stimulating mm of the oropharynx to induce swallowing. ? ? ?RECOMMENDATIONS FOR ALL PATIENTS WITH MEMORY PROBLEMS: ?1. Continue to exercise (Recommend 30 minutes of walking everyday, or 3 hours every week) ?2. Increase social interactions - continue going to [place] and enjoy social gatherings with friends and family ?3. Eat healthy, avoid fried foods and eat more fruits and vegetables ?4. Maintain adequate blood pressure, blood sugar, and blood cholesterol level. Reducing the risk of stroke and cardiovascular disease also helps promoting better memory. ?5. Avoid stressful situations. Live a simple life and avoid aggravations. Organize your time and prepare for the next day in anticipation. ?6. Sleep well, avoid any interruptions of sleep and avoid any distractions in the bedroom that may interfere with adequate sleep quality ?7. Avoid sugar, avoid sweets as there is a strong link between excessive sugar intake, diabetes, and cognitive impairment ?We discussed the Mediterranean diet, which has been shown to help patients reduce the risk of progressive memory disorders and reduces cardiovascular risk. This includes eating fish, eat fruits and green leafy vegetables, nuts like almonds and hazelnuts, walnuts, and also use olive oil. Avoid fast foods and fried foods as much as possible. Avoid sweets and sugar as sugar use has been linked to worsening of memory function. ? ?There is always a concern of gradual progression of memory problems. If this is the case,  then we may need to adjust level of care according to patient needs. Support, both to the patient and caregiver, should then be put into place.  ? ? ?FALL PRECAUTIONS: Be cautious when walking. Scan the area for obstacles that may increase the risk of trips and falls. When getting up in the mornings, sit up at the edge of the bed for a few minutes before getting out of bed. Consider elevating the bed at the head end to avoid drop of blood pressure when getting up. Walk always in a well-lit room (use night lights in the walls). Avoid area rugs or power cords from appliances in the middle of the walkways. Use a walker or a cane if necessary and consider physical therapy for balance exercise. Get your eyesight checked regularly. ? ?FINANCIAL OVERSIGHT: Supervision, especially oversight when making financial decisions or transactions is also recommended. ? ?HOME SAFETY: Consider the safety of the kitchen when operating appliances like stoves, microwave oven, and blender. Consider having supervision and share cooking responsibilities until no longer able to participate in those. Accidents with firearms and other hazards in the house should be identified and addressed as well. ? ? ?ABILITY TO BE LEFT ALONE: If patient is unable to contact 911 operator, consider using LifeLine, or when the need is there, arrange for someone to stay with patients. Smoking is a fire hazard, consider supervision or cessation. Risk of wandering should be assessed by caregiver and if detected at any point, supervision and safe proof recommendations should be instituted. ? ?MEDICATION SUPERVISION: Inability to self-administer medication needs to be constantly  addressed. Implement a mechanism to ensure safe administration of the medications. ? ? ?DRIVING: Regarding driving, in patients with progressive memory problems, driving will be impaired. We advise to have someone else do the driving if trouble finding directions or if minor accidents are  reported. Independent driving assessment is available to determine safety of driving. ? ? ?If you are interested in the driving assessment, you can contact the following: ? ?[employer] in [place] ? ?[place] [phone] ? ?[facility] [phone] ? ?[facility] [phone] or [phone] ?  ?

## 2022-01-04 ENCOUNTER — Telehealth (HOSPITAL_COMMUNITY): Payer: Self-pay | Admitting: Emergency Medicine

## 2022-01-04 NOTE — Telephone Encounter (Signed)
Reaching out to patient to offer assistance regarding upcoming cardiac imaging study; pt verbalizes understanding of appt date/time, parking situation and where to check in, pre-test NPO status and medications ordered, and verified current allergies; name and call back number provided for further questions should they arise ?Marchia Bond RN Navigator Cardiac Imaging ?[facility] ?[phone] office ?[phone] cell ? ? ?Denies iv issues ?'25mg'$  metoprolol tartrate in addition to daily metoprolol succinate ?Arrival 1130 ? ?

## 2022-01-05 ENCOUNTER — Other Ambulatory Visit: Payer: Self-pay | Admitting: Internal Medicine

## 2022-01-05 ENCOUNTER — Ambulatory Visit (HOSPITAL_COMMUNITY)
Admission: RE | Admit: 2022-01-05 | Discharge: 2022-01-05 | Disposition: A | Discharge status: Home / Self Care | Payer: PPO | Source: Ambulatory Visit | Attending: Cardiology | Admitting: Cardiology

## 2022-01-05 ENCOUNTER — Ambulatory Visit (HOSPITAL_COMMUNITY)
Admission: RE | Admit: 2022-01-05 | Discharge: 2022-01-05 | Disposition: A | Discharge status: Home / Self Care | Payer: PPO | Source: Ambulatory Visit | Attending: Internal Medicine | Admitting: Internal Medicine

## 2022-01-05 ENCOUNTER — Encounter (HOSPITAL_COMMUNITY): Payer: Self-pay

## 2022-01-05 ENCOUNTER — Other Ambulatory Visit: Payer: Self-pay

## 2022-01-05 DIAGNOSIS — R931 Abnormal findings on diagnostic imaging of heart and coronary circulation: Secondary | ICD-10-CM

## 2022-01-05 DIAGNOSIS — I2510 Atherosclerotic heart disease of native coronary artery without angina pectoris: Secondary | ICD-10-CM | POA: Diagnosis not present

## 2022-01-05 DIAGNOSIS — R072 Precordial pain: Secondary | ICD-10-CM | POA: Diagnosis not present

## 2022-01-05 MED ORDER — IOHEXOL 350 MG/ML SOLN
95.0000 mL | Freq: Once | INTRAVENOUS | Status: AC | PRN
  Administered 2022-01-05: 95 mL via INTRAVENOUS

## 2022-01-05 MED ORDER — NITROGLYCERIN 0.4 MG SL SUBL
SUBLINGUAL_TABLET | SUBLINGUAL | Status: AC
  Filled 2022-01-05: qty 2

## 2022-01-05 MED ORDER — NITROGLYCERIN 0.4 MG SL SUBL
0.8000 mg | SUBLINGUAL_TABLET | Freq: Once | SUBLINGUAL | Status: AC
  Administered 2022-01-05: 0.8 mg via SUBLINGUAL

## 2022-01-05 NOTE — Progress Notes (Signed)
Please send for FFR - Dr. Mickayla Trouten 

## 2022-01-09 ENCOUNTER — Telehealth: Payer: Self-pay

## 2022-01-09 ENCOUNTER — Other Ambulatory Visit: Payer: Self-pay

## 2022-01-09 ENCOUNTER — Other Ambulatory Visit: Payer: PPO

## 2022-01-09 DIAGNOSIS — R931 Abnormal findings on diagnostic imaging of heart and coronary circulation: Secondary | ICD-10-CM | POA: Diagnosis not present

## 2022-01-09 DIAGNOSIS — R072 Precordial pain: Secondary | ICD-10-CM | POA: Diagnosis not present

## 2022-01-09 LAB — CBC
Hematocrit: 44.2 % (ref 37.5–51.0)
Hemoglobin: 14.5 g/dL (ref 13.0–17.7)
MCH: 29.8 pg (ref 26.6–33.0)
MCHC: 32.8 g/dL (ref 31.5–35.7)
MCV: 91 fL (ref 79–97)
Platelets: 218 10*3/uL (ref 150–450)
RBC: 4.86 x10E6/uL (ref 4.14–5.80)
RDW: 13.7 % (ref 11.6–15.4)
WBC: 7.4 10*3/uL (ref 3.4–10.8)

## 2022-01-09 LAB — BASIC METABOLIC PANEL
BUN/Creatinine Ratio: 15 (ref 10–24)
BUN: 18 mg/dL (ref 8–27)
CO2: 31 mmol/L — ABNORMAL HIGH (ref 20–29)
Calcium: 10.5 mg/dL — ABNORMAL HIGH (ref 8.6–10.2)
Chloride: 102 mmol/L (ref 96–106)
Creatinine, Ser: 1.19 mg/dL (ref 0.76–1.27)
Glucose: 125 mg/dL — ABNORMAL HIGH (ref 70–99)
Potassium: 4.7 mmol/L (ref 3.5–5.2)
Sodium: 138 mmol/L (ref 134–144)
eGFR: 61 mL/min/{1.73_m2} (ref >59)

## 2022-01-09 NOTE — Telephone Encounter (Signed)
Spoke with the patient and scheduled him for a heart cath. Instructions reviewed and sent through [place].  ?

## 2022-01-09 NOTE — Telephone Encounter (Signed)
-----   Message from [name], MD sent at 01/08/2022  8:43 AM EDT ----- ?I have called patient and discussed the results of the study with the patient which showed obstructive 3 vessel CAD. I have recommended proceeding with cardiac cath.  Please set up for heart cath this week ASAP ? ?Shared Decision Making/Informed Consent ?The risks [stroke (1 in 1000), death (1 in 60), kidney failure [usually temporary] (1 in 500), bleeding (1 in 200), allergic reaction [possibly serious] (1 in 200)], benefits (diagnostic support and management of coronary artery disease) and alternatives of a cardiac catheterization were discussed in detail with Mr. Bruun and he is willing to proceed. ? ? ? ? ?  ?----- Message ----- ?From: Pixie Casino, MD ?Sent: 01/05/2022   3:23 PM EDT ?To: [name], MD ? ?Tressia Miners- Mr. Eno looks to have severe 3V disease, flow-limiting by FFR with heavy calcium. Will likely need a cath. ? ?-[place] ? ?

## 2022-01-11 ENCOUNTER — Telehealth: Payer: Self-pay | Admitting: *Deleted

## 2022-01-11 NOTE — Telephone Encounter (Signed)
Cardiac Catheterization scheduled at [facility] for: Friday January 12, 2022 12 noon ?Arrival time and place: [place] Entrance A at: 10 AM ? ? ?No solid food after midnight prior to cath, clear liquids until 5 AM day of procedure. ? ?Medication instructions: ?-Usual morning medications can be taken with sips of water including aspirin 81 mg. ? ?Confirmed patient has responsible adult to drive home post procedure and be with patient first 24 hours after arriving home: ? ?One visitor is allowed to stay in the waiting room during the time you are at the hospital for your procedure.  ? ?Patient reports no symptoms concerning for COVID-19/no exposure to COVID-19 in the past 10 days. ? ?Reviewed procedure instructions with patient.  ? ?

## 2022-01-12 ENCOUNTER — Other Ambulatory Visit: Payer: Self-pay

## 2022-01-12 ENCOUNTER — Encounter: Payer: PPO | Admitting: Psychology

## 2022-01-12 ENCOUNTER — Ambulatory Visit (HOSPITAL_COMMUNITY)
Admission: RE | Admit: 2022-01-12 | Discharge: 2022-01-12 | Disposition: A | Discharge status: Home / Self Care | Payer: PPO | Source: Ambulatory Visit | Attending: Internal Medicine | Admitting: Internal Medicine

## 2022-01-12 ENCOUNTER — Ambulatory Visit (HOSPITAL_COMMUNITY)
Admission: RE | Disposition: A | Discharge status: Home / Self Care | Payer: PPO | Source: Ambulatory Visit | Attending: Internal Medicine

## 2022-01-12 DIAGNOSIS — Z79899 Other long term (current) drug therapy: Secondary | ICD-10-CM | POA: Insufficient documentation

## 2022-01-12 DIAGNOSIS — Z87891 Personal history of nicotine dependence: Secondary | ICD-10-CM | POA: Insufficient documentation

## 2022-01-12 DIAGNOSIS — R0602 Shortness of breath: Secondary | ICD-10-CM | POA: Diagnosis not present

## 2022-01-12 DIAGNOSIS — E7800 Pure hypercholesterolemia, unspecified: Secondary | ICD-10-CM | POA: Insufficient documentation

## 2022-01-12 DIAGNOSIS — I10 Essential (primary) hypertension: Secondary | ICD-10-CM | POA: Insufficient documentation

## 2022-01-12 DIAGNOSIS — I25119 Atherosclerotic heart disease of native coronary artery with unspecified angina pectoris: Secondary | ICD-10-CM | POA: Diagnosis not present

## 2022-01-12 HISTORY — PX: CORONARY PRESSURE/FFR STUDY: CATH118243

## 2022-01-12 HISTORY — PX: LEFT HEART CATH AND CORONARY ANGIOGRAPHY: CATH118249

## 2022-01-12 LAB — POCT ACTIVATED CLOTTING TIME: Activated Clotting Time: 323 seconds

## 2022-01-12 SURGERY — LEFT HEART CATH AND CORONARY ANGIOGRAPHY
Anesthesia: LOCAL

## 2022-01-12 MED ORDER — ISOSORBIDE MONONITRATE ER 30 MG PO TB24: 30.0000 mg | ORAL_TABLET | Freq: Every day | ORAL | 1 refills | Status: DC

## 2022-01-12 MED ORDER — IOHEXOL 350 MG/ML SOLN
INTRAVENOUS | Status: DC | PRN
  Administered 2022-01-12: 85 mL

## 2022-01-12 MED ORDER — SODIUM CHLORIDE 0.9% FLUSH: 3.0000 mL | Freq: Two times a day (BID) | INTRAVENOUS | Status: DC

## 2022-01-12 MED ORDER — LABETALOL HCL 5 MG/ML IV SOLN: 10.0000 mg | INTRAVENOUS | Status: DC | PRN

## 2022-01-12 MED ORDER — HYDRALAZINE HCL 20 MG/ML IJ SOLN
10.0000 mg | INTRAMUSCULAR | Status: DC | PRN
  Administered 2022-01-12: 10 mg via INTRAVENOUS
  Filled 2022-01-12: qty 1

## 2022-01-12 MED ORDER — FENTANYL CITRATE (PF) 100 MCG/2ML IJ SOLN
INTRAMUSCULAR | Status: DC | PRN
Start: 2022-01-12 — End: 2022-01-12
  Administered 2022-01-12 (×3): 25 ug via INTRAVENOUS

## 2022-01-12 MED ORDER — MIDAZOLAM HCL 2 MG/2ML IJ SOLN
INTRAMUSCULAR | Status: AC
  Filled 2022-01-12: qty 2

## 2022-01-12 MED ORDER — HEPARIN (PORCINE) IN NACL 1000-0.9 UT/500ML-% IV SOLN
INTRAVENOUS | Status: DC | PRN
  Administered 2022-01-12 (×2): 500 mL

## 2022-01-12 MED ORDER — ONDANSETRON HCL 4 MG/2ML IJ SOLN: 4.0000 mg | Freq: Four times a day (QID) | INTRAMUSCULAR | Status: DC | PRN

## 2022-01-12 MED ORDER — VERAPAMIL HCL 2.5 MG/ML IV SOLN
INTRAVENOUS | Status: AC
  Filled 2022-01-12: qty 2

## 2022-01-12 MED ORDER — HEPARIN SODIUM (PORCINE) 1000 UNIT/ML IJ SOLN
INTRAMUSCULAR | Status: DC | PRN
  Administered 2022-01-12: 4000 [IU] via INTRAVENOUS
  Administered 2022-01-12: 5000 [IU] via INTRAVENOUS

## 2022-01-12 MED ORDER — SODIUM CHLORIDE 0.9% FLUSH: 3.0000 mL | INTRAVENOUS | Status: DC | PRN

## 2022-01-12 MED ORDER — HEPARIN (PORCINE) IN NACL 1000-0.9 UT/500ML-% IV SOLN
INTRAVENOUS | Status: AC
  Filled 2022-01-12: qty 1000

## 2022-01-12 MED ORDER — LIDOCAINE HCL (PF) 1 % IJ SOLN
INTRAMUSCULAR | Status: DC | PRN
  Administered 2022-01-12: 2 mL

## 2022-01-12 MED ORDER — SODIUM CHLORIDE 0.9 % IV SOLN: 250.0000 mL | INTRAVENOUS | Status: DC | PRN

## 2022-01-12 MED ORDER — HEPARIN SODIUM (PORCINE) 1000 UNIT/ML IJ SOLN
INTRAMUSCULAR | Status: AC
  Filled 2022-01-12: qty 10

## 2022-01-12 MED ORDER — ACETAMINOPHEN 325 MG PO TABS: 650.0000 mg | ORAL_TABLET | ORAL | Status: DC | PRN

## 2022-01-12 MED ORDER — LIDOCAINE HCL (PF) 1 % IJ SOLN
INTRAMUSCULAR | Status: AC
Start: 2022-01-12 — End: ?
  Filled 2022-01-12: qty 30

## 2022-01-12 MED ORDER — ASPIRIN 81 MG PO CHEW: 81.0000 mg | CHEWABLE_TABLET | ORAL | Status: DC

## 2022-01-12 MED ORDER — SODIUM CHLORIDE 0.9 % WEIGHT BASED INFUSION: 1.0000 mL/kg/h | INTRAVENOUS | Status: DC

## 2022-01-12 MED ORDER — SODIUM CHLORIDE 0.9 % WEIGHT BASED INFUSION
3.0000 mL/kg/h | INTRAVENOUS | Status: AC
  Administered 2022-01-12: 3 mL/kg/h via INTRAVENOUS

## 2022-01-12 MED ORDER — NITROGLYCERIN 1 MG/10 ML FOR IR/CATH LAB
INTRA_ARTERIAL | Status: AC
  Filled 2022-01-12: qty 10

## 2022-01-12 MED ORDER — FENTANYL CITRATE (PF) 100 MCG/2ML IJ SOLN
INTRAMUSCULAR | Status: AC
Start: 2022-01-12 — End: ?
  Filled 2022-01-12: qty 2

## 2022-01-12 MED ORDER — MIDAZOLAM HCL 2 MG/2ML IJ SOLN
INTRAMUSCULAR | Status: DC | PRN
  Administered 2022-01-12 (×3): 1 mg via INTRAVENOUS

## 2022-01-12 MED ORDER — SODIUM CHLORIDE 0.9 % IV SOLN: INTRAVENOUS | Status: DC

## 2022-01-12 MED ORDER — VERAPAMIL HCL 2.5 MG/ML IV SOLN
INTRAVENOUS | Status: DC | PRN
  Administered 2022-01-12: 10 mL via INTRA_ARTERIAL

## 2022-01-12 SURGICAL SUPPLY — 19 items
CATH DIAG 6FR JR4 (CATHETERS) ×1 IMPLANT
CATH INFINITI 5 FR 3DRC (CATHETERS) ×1 IMPLANT
CATH INFINITI 6F AL1 (CATHETERS) ×1 IMPLANT
CATH INFINITI 6F FL3.5 (CATHETERS) ×1 IMPLANT
CATH INFINITI 6F MPA2 100CM (CATHETERS) ×1 IMPLANT
CATH LAUNCHER 6FR EBU3.5 (CATHETERS) ×1 IMPLANT
CATHETER LAUNCHER 6FR JR5 (CATHETERS) ×1 IMPLANT
DEVICE RAD COMP TR BAND LRG (VASCULAR PRODUCTS) ×1 IMPLANT
ELECT DEFIB PAD ADLT CADENCE (PAD) ×1 IMPLANT
GLIDESHEATH SLEND SS 6F .021 (SHEATH) ×1 IMPLANT
GUIDEWIRE INQWIRE 1.5J.035X260 (WIRE) IMPLANT
GUIDEWIRE PRESSURE X 175 (WIRE) ×1 IMPLANT
INQWIRE 1.5J .035X260CM (WIRE) ×2
KIT HEART LEFT (KITS) ×2 IMPLANT
PACK CARDIAC CATHETERIZATION (CUSTOM PROCEDURE TRAY) ×2 IMPLANT
SYR MEDRAD MARK 7 150ML (SYRINGE) ×2 IMPLANT
TRANSDUCER W/STOPCOCK (MISCELLANEOUS) ×2 IMPLANT
TUBING CIL FLEX 10 FLL-RA (TUBING) ×2 IMPLANT
WIRE EMERALD 3MM-J .035X260CM (WIRE) ×1 IMPLANT

## 2022-01-12 NOTE — Interval H&P Note (Signed)
History and Physical Interval Note: ? ?01/12/2022 ?10:08 AM ? ?[name].  has presented today for surgery, with the diagnosis of cad.  The various methods of treatment have been discussed with the patient and family. After consideration of risks, benefits and other options for treatment, the patient has consented to  Procedure(s): ?LEFT HEART CATH AND CORONARY ANGIOGRAPHY (N/A) as a surgical intervention.  The patient's history has been reviewed, patient examined, no change in status, stable for surgery.  I have reviewed the patient's chart and labs.  Questions were answered to the patient's satisfaction.   ? ?Cath Lab Visit (complete for each Cath Lab visit) ? ?Clinical Evaluation Leading to the Procedure:  ? ?ACS: No. ? ?Non-ACS:   ? ?Anginal Classification: CCS II ? ?Anti-ischemic medical therapy: Minimal Therapy (1 class of medications) ? ?Non-Invasive Test Results: High-risk stress test findings: cardiac mortality >3%/year ? ?Prior CABG: No previous CABG ? ? ? ? ? ? ? ?[place] ? ? ?

## 2022-01-13 ENCOUNTER — Encounter: Payer: Self-pay | Admitting: Cardiology

## 2022-01-15 ENCOUNTER — Encounter: Payer: Self-pay | Admitting: Cardiology

## 2022-01-15 ENCOUNTER — Telehealth: Payer: Self-pay | Admitting: Cardiology

## 2022-01-15 ENCOUNTER — Encounter (HOSPITAL_COMMUNITY): Payer: Self-pay | Admitting: Internal Medicine

## 2022-01-15 MED FILL — Nitroglycerin IV Soln 100 MCG/ML in D5W: INTRA_ARTERIAL | Qty: 10 | Status: AC

## 2022-01-15 NOTE — Telephone Encounter (Signed)
Wife of the patient called. The wife wanted to know what kind of restrictions are in place that the patient would need to follow post heart cath. She also wanted to know if there are any changes to his medications following the heart cath. ? ? ?The patient  and his wife are planning on flying to [place] in 2 weeks and she would like to know if it is safe for him to travel via airplane. Please advise ?

## 2022-01-15 NOTE — Telephone Encounter (Signed)
Talked about restrictions post cath and medication changes. Advised that the mychart message was since to Dr. Radford Pax and waiting to find out about flying.  ?Verbalized understanding.  ?

## 2022-01-15 NOTE — Telephone Encounter (Signed)
See phone note as well ? ?

## 2022-01-18 DIAGNOSIS — D1801 Hemangioma of skin and subcutaneous tissue: Secondary | ICD-10-CM | POA: Diagnosis not present

## 2022-01-18 DIAGNOSIS — L570 Actinic keratosis: Secondary | ICD-10-CM | POA: Diagnosis not present

## 2022-01-18 DIAGNOSIS — L853 Xerosis cutis: Secondary | ICD-10-CM | POA: Diagnosis not present

## 2022-01-18 DIAGNOSIS — Z85828 Personal history of other malignant neoplasm of skin: Secondary | ICD-10-CM | POA: Diagnosis not present

## 2022-01-18 DIAGNOSIS — L812 Freckles: Secondary | ICD-10-CM | POA: Diagnosis not present

## 2022-01-18 DIAGNOSIS — L821 Other seborrheic keratosis: Secondary | ICD-10-CM | POA: Diagnosis not present

## 2022-01-26 NOTE — Telephone Encounter (Signed)
Spoke with the patient who reports two episodes of chest discomfort since having his heart cath. Patient reports that he went to the [facility] this morning and worked out and felt good. He states that his episodes occurred when he was exerting himself more than usual and walking up hills. He states that he has been really cautious ever since. He did get some shortness of breath with these episodes. Similar to how he felt prior to this heart cath.  ?Patient reports that he rested and discomfort resolved with both episodes. Patient is scheduled to see Dr. Radford Pax on 4/4. ER precautions have been reviewed for return of chest pain.  ?

## 2022-01-30 ENCOUNTER — Encounter: Payer: Self-pay | Admitting: Cardiology

## 2022-01-30 ENCOUNTER — Ambulatory Visit: Payer: PPO | Admitting: Cardiology

## 2022-01-30 VITALS — BP 122/64 | HR 59 | Ht 72.0 in | Wt 183.4 lb

## 2022-01-30 DIAGNOSIS — I2510 Atherosclerotic heart disease of native coronary artery without angina pectoris: Secondary | ICD-10-CM

## 2022-01-30 DIAGNOSIS — I10 Essential (primary) hypertension: Secondary | ICD-10-CM | POA: Diagnosis not present

## 2022-01-30 DIAGNOSIS — I2583 Coronary atherosclerosis due to lipid rich plaque: Secondary | ICD-10-CM

## 2022-01-30 DIAGNOSIS — E7800 Pure hypercholesterolemia, unspecified: Secondary | ICD-10-CM

## 2022-01-30 MED ORDER — NITROGLYCERIN 0.4 MG SL SUBL: 0.4000 mg | SUBLINGUAL_TABLET | SUBLINGUAL | 3 refills | Status: DC | PRN

## 2022-01-30 MED ORDER — ISOSORBIDE MONONITRATE ER 60 MG PO TB24: 60.0000 mg | ORAL_TABLET | Freq: Every day | ORAL | 3 refills | Status: DC

## 2022-01-30 NOTE — Patient Instructions (Addendum)
Medication Instructions:  ?Your physician has recommended you make the following change in your medication:  ? ?1) START taking nitroglycerin sublingual tablets as needed for chest pain ?2) INCREASE Imdur (isosorbide mononitrate) to 60 mg daily ? ?*If you need a refill on your cardiac medications before your next appointment, please call your pharmacy* ? ? ?Lab Work: ?Fasting lipids and ALT on Thursday 4/6 between 7:15am-5:00pm  ? ?If you have labs (blood work) drawn today and your tests are completely normal, you will receive your results only by: ?MyChart Message (if you have MyChart) OR ?A paper copy in the mail ?If you have any lab test that is abnormal or we need to change your treatment, we will call you to review the results. ? ?Follow-Up: ?At [facility], you and your health needs are our priority.  As part of our continuing mission to provide you with exceptional heart care, we have created designated Provider Care Teams.  These Care Teams include your primary Cardiologist (physician) and Advanced Practice Providers (APPs -  Physician Assistants and Nurse Practitioners) who all work together to provide you with the care you need, when you need it. ? ?Your next appointment:   ?Monday, April 24th at 10:20am  ? ?The format for your next appointment:   ?In Person ? ?Provider:   ?[name], MD   ? ? ?

## 2022-01-30 NOTE — Progress Notes (Signed)
? ?Cardiology Office Note   ? ?Date:  01/30/2022  ? ?ID:  Eric Roux., DOB 1941/04/21, MRN [id number] ? ?PCP:  [name], MD  ?Cardiologist:  [name], MD  ? ?Chief Complaint  ?Patient presents with  ? Coronary Artery Disease  ? Hypertension  ? Hyperlipidemia  ? ? ?History of Present Illness:  ?Eric Sperl. is a 81 y.o. male with a hx of HLD,  DOE and chest tightness working in the yard and walking. His father died at 27 of a massive MI. He underwent nuclear stress test 03/2020 showing no ischemia and normal LVF.  2D echo showed normal LVF and mildly leaky MV.   2D echo done 04/17/2020 showed normal LV function with mild MR.  He underwent cardiac cath 01/12/2022 which showed 99% oRCA, 85% mRCA and 60% pLAD with L>R collaterals.  Medical management was recommended.  He was started on IMdur '30mg'$  daily and is now back for followup.  ? ?He tells me that since his cath he has had several episodes of chest pressure mainly with exertion.  The first episode was at the gym when he was walking a long distance to get into the building.  He rested and it went away.  There other time was when he was walking up a hill.  The pain resolved with rest.  He used to walk about an hour but now has cut back to 30 min because he gets tired.  He denies any SOB, DOE, PND, orthopnea, LE edema, dizziness, palpitations or syncope. He is compliant with his meds and is tolerating meds with no SE.    ? ?Past Medical History:  ?Diagnosis Date  ? Adenomatous colon polyp 03/2005  ? tubular adenoma  ? Aortic atherosclerosis ([place])   ? BCC (basal cell carcinoma), face   ? left face, R ear (04/2014), nose 05/2016  ? Benign familial tremor 08/29/2012  ? Erectile dysfunction   ? Hemorrhoids   ? internal and external  ? Hip arthritis   ? left  ? IFG (impaired fasting glucose) 12/10/2012  ? Glucose 111 11/2012   ? Insomnia   ? Mild mitral regurgitation   ? Mild neurocognitive disorder 09/11/2019  ? Pseudogout of knee 07/2003  ? left  ? Pulmonary  nodule   ? Pure hypercholesterolemia   ? Syncope   ? ? ?Past Surgical History:  ?Procedure Laterality Date  ? COLONOSCOPY  6/06, 05/2010  ? Dr. [name]  ? INTRAVASCULAR PRESSURE WIRE/FFR STUDY N/A 01/12/2022  ? Procedure: INTRAVASCULAR PRESSURE WIRE/FFR STUDY;  Surgeon: Early Osmond, MD;  Location: [place] [facility];  Service: Cardiovascular;  Laterality: N/A;  ? LEFT HEART CATH AND CORONARY ANGIOGRAPHY N/A 01/12/2022  ? Procedure: LEFT HEART CATH AND CORONARY ANGIOGRAPHY;  Surgeon: Early Osmond, MD;  Location: [facility];  Service: Cardiovascular;  Laterality: N/A;  ? MOHS SURGERY  6/07  ? L face, Dr. Link Snuffer  ? POLYPECTOMY    ? TONSILLECTOMY    ? TOTAL HIP ARTHROPLASTY Left 04/18/2021  ? Procedure: LEFT TOTAL HIP ARTHROPLASTY ANTERIOR APPROACH;  Surgeon: Melrose Nakayama, MD;  Location: [facility];  Service: Orthopedics;  Laterality: Left;  ? VASECTOMY    ? ? ?Current Medications: ?Current Meds  ?Medication Sig  ? acetaminophen (TYLENOL) 500 MG tablet Take 500 mg by mouth every 6 (six) hours as needed.  ? acetaminophen (TYLENOL) 650 MG CR tablet Take 650 mg by mouth every 8 (eight) hours as needed for pain.  ?  amLODipine (NORVASC) 5 MG tablet Take 1 tablet (5 mg total) by mouth daily.  ? Cholecalciferol (VITAMIN D3) 50 MCG (2000 UT) TABS Take 1 tablet by mouth daily.  ? Cyanocobalamin (B-12) 2500 MCG TABS Take 2,500 mcg by mouth daily.  ? finasteride (PROSCAR) 5 MG tablet Take 5 mg by mouth at bedtime.  ? isosorbide mononitrate (IMDUR) 30 MG 24 hr tablet Take 1 tablet (30 mg total) by mouth daily.  ? Lidocaine-Glycerin (PREPARATION H EX) Place 1 application. rectally daily as needed (irritation).  ? losartan (COZAAR) 50 MG tablet Take 1 tablet (50 mg total) by mouth daily.  ? metoprolol succinate (TOPROL-XL) 25 MG 24 hr tablet TAKE ONE TABLET DAILY  ? primidone (MYSOLINE) 50 MG tablet TAKE ONE TABLET AT BEDTIME  ? Propylene Glycol (SYSTANE COMPLETE) 0.6 % SOLN Place 1 drop into both eyes daily as needed  (dry eyes).  ? rosuvastatin (CRESTOR) 20 MG tablet Take 1 tablet (20 mg total) by mouth daily.  ? ? ?Allergies:   Codeine  ? ?Social History  ? ?Socioeconomic History  ? Marital status: Married  ?  Spouse name: Not on file  ? Number of children: 2  ? Years of education: 84  ? Highest education level: Bachelor's degree (e.g., BA, AB, BS)  ?Occupational History  ? Occupation: retired  ?Tobacco Use  ? Smoking status: Former  ?  Types: Cigars  ? Smokeless tobacco: Never  ? Tobacco comments:  ?  as a teenager  ?Vaping Use  ? Vaping Use: Never used  ?Substance and Sexual Activity  ? Alcohol use: Yes  ?  Alcohol/week: 0.0 standard drinks  ?  Comment: 5-6 ounces of [place] wine or beer 6-7 x/week   ? Drug use: No  ? Sexual activity: Yes  ?Other Topics Concern  ? Not on file  ?Social History Narrative  ? Widowed 2013 (after wife's long battle with MS).  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  Children live in [place], [place] and [place].  ? Married March 2016 [name])  ? Right handed  ? Drinks caffeine  ? Two story home  ? ?Social Determinants of Health  ? ?Financial Resource Strain: Not on file  ?Food Insecurity: Not on file  ?Transportation Needs: Not on file  ?Physical Activity: Not on file  ?Stress: Not on file  ?Social Connections: Not on file  ?  ? ?Family History:  The patient's family history includes Cancer (age of onset: 37) in his mother; Colon cancer in his mother; Colon polyps in his mother; Diabetes in his mother; Heart disease in his brother, father, and son; Hyperlipidemia in his brother, brother, brother, son, and son; Hypertension in his father; Sleep apnea in his son; Syncope episode in his son; Tremor in his brother and brother.  ? ?ROS:   ?Please see the history of present illness.    ?ROS All other systems reviewed and are negative. ? ?   ? View : No data to display.  ?  ?  ?   ? ? ? ? ? ?PHYSICAL EXAM:   ?VS:  BP 122/64   Pulse (!) 59   Ht 6' (1.829 m)   Wt 183 lb 6.4 oz (83.2 kg)   SpO2 96%   BMI 24.87 kg/m?    ?GEN: Well nourished, well developed in no acute distress ?HEENT: Normal ?NECK: No JVD; No carotid  bruits ?LYMPHATICS: No lymphadenopathy ?CARDIAC:RRR, no murmurs, rubs, gallops ?RESPIRATORY:  Clear to auscultation without rales, wheezing or rhonchi  ?ABDOMEN: Soft, non-tender, non-distended ?MUSCULOSKELETAL:  No edema; No deformity  ?SKIN: Warm and dry ?NEUROLOGIC:  Alert and oriented x 3 ?PSYCHIATRIC:  Normal affect   ?Wt Readings from Last 3 Encounters:  ?01/30/22 183 lb 6.4 oz (83.2 kg)  ?01/12/22 175 lb (79.4 kg)  ?01/02/22 180 lb (81.6 kg)  ?  ? ? ?Studies/Labs Reviewed:  ? ?EKG:  EKG is not ordered today  ? ?Recent Labs: ?03/29/2021: ALT 18 ?01/09/2022: BUN 18; Creatinine, Ser 1.19; Hemoglobin 14.5; Platelets 218; Potassium 4.7; Sodium 138  ? ?Lipid Panel ?   ?Component Value Date/Time  ? CHOL 144 03/29/2021 0816  ? TRIG 101 03/29/2021 0816  ? HDL 48 03/29/2021 0816  ? CHOLHDL 3.0 03/29/2021 0816  ? CHOLHDL 2.9 10/30/2017 0944  ? VLDL 17 08/20/2016 1051  ? LDLCALC 77 03/29/2021 0816  ? [place] 80 10/30/2017 0944  ? ? ?Additional studies/ records that were reviewed today include:  ?2D echo 03/2020 ?IMPRESSIONS  ? ? ? 1. Left ventricular ejection fraction, by estimation, is 60 to 65%. The  ?left ventricle has normal function. The left ventricle has no regional  ?wall motion abnormalities. Left ventricular diastolic parameters are  ?indeterminate.  ? 2. Right ventricular systolic function is normal. The right ventricular  ?size is normal. There is normal pulmonary artery systolic pressure. The  ?estimated right ventricular systolic pressure is 75.1 mmHg.  ? 3. The mitral valve is grossly normal. Mild mitral valve regurgitation.  ?No evidence of mitral stenosis.  ? 4. The aortic valve is tricuspid. Aortic valve regurgitation is not  ?visualized. Mild aortic valve sclerosis is  present, with no evidence of  ?aortic valve stenosis.  ? 5. The inferior vena cava is normal in size with greater than 50%  ?respiratory variability, suggesting right atrial pressure of 3 mmHg.  ? ?Cardiac

## 2022-01-30 NOTE — Addendum Note (Signed)
Addended by: Antonieta Iba on: 01/30/2022 09:05 AM ? ? Modules accepted: Orders ? ?

## 2022-02-01 ENCOUNTER — Other Ambulatory Visit: Payer: PPO

## 2022-02-01 DIAGNOSIS — I2510 Atherosclerotic heart disease of native coronary artery without angina pectoris: Secondary | ICD-10-CM

## 2022-02-01 DIAGNOSIS — I10 Essential (primary) hypertension: Secondary | ICD-10-CM | POA: Diagnosis not present

## 2022-02-01 DIAGNOSIS — E7800 Pure hypercholesterolemia, unspecified: Secondary | ICD-10-CM

## 2022-02-01 DIAGNOSIS — I2583 Coronary atherosclerosis due to lipid rich plaque: Secondary | ICD-10-CM | POA: Diagnosis not present

## 2022-02-01 LAB — LIPID PANEL
Chol/HDL Ratio: 2.8 ratio (ref 0.0–5.0)
Cholesterol, Total: 130 mg/dL (ref 100–199)
HDL: 47 mg/dL (ref >39)
LDL Chol Calc (NIH): 67 mg/dL (ref 0–99)
Triglycerides: 79 mg/dL (ref 0–149)
VLDL Cholesterol Cal: 16 mg/dL (ref 5–40)

## 2022-02-01 LAB — ALT: ALT: 19 IU/L (ref 0–44)

## 2022-02-04 ENCOUNTER — Encounter: Payer: Self-pay | Admitting: Cardiology

## 2022-02-06 NOTE — Telephone Encounter (Signed)
Spoke to patient directly who states he didn't tolerate the twice daily dosing of '60mg'$  due to feeling dizzy and "off balance." Reviewed recommendation to take only '30mg'$  twice daily. Pt states that he will make this change and let us know if he is still having any issues.  ?

## 2022-02-19 ENCOUNTER — Encounter: Payer: Self-pay | Admitting: Cardiology

## 2022-02-19 ENCOUNTER — Ambulatory Visit: Payer: PPO | Admitting: Cardiology

## 2022-02-19 VITALS — BP 118/80 | HR 64 | Ht 72.0 in | Wt 180.4 lb

## 2022-02-19 DIAGNOSIS — I10 Essential (primary) hypertension: Secondary | ICD-10-CM | POA: Diagnosis not present

## 2022-02-19 DIAGNOSIS — E7800 Pure hypercholesterolemia, unspecified: Secondary | ICD-10-CM | POA: Diagnosis not present

## 2022-02-19 DIAGNOSIS — I2583 Coronary atherosclerosis due to lipid rich plaque: Secondary | ICD-10-CM

## 2022-02-19 DIAGNOSIS — I2510 Atherosclerotic heart disease of native coronary artery without angina pectoris: Secondary | ICD-10-CM | POA: Insufficient documentation

## 2022-02-19 MED ORDER — ISOSORBIDE MONONITRATE ER 30 MG PO TB24: ORAL_TABLET | ORAL | 3 refills | Status: DC

## 2022-02-19 MED ORDER — EZETIMIBE 10 MG PO TABS: 10.0000 mg | ORAL_TABLET | Freq: Every day | ORAL | 3 refills | Status: DC

## 2022-02-19 MED ORDER — AMLODIPINE BESYLATE 5 MG PO TABS: ORAL_TABLET | ORAL | 3 refills | Status: DC

## 2022-02-19 MED ORDER — ASPIRIN EC 81 MG PO TBEC: 81.0000 mg | DELAYED_RELEASE_TABLET | Freq: Every morning | ORAL | 3 refills | Status: AC

## 2022-02-19 MED ORDER — LOSARTAN POTASSIUM 50 MG PO TABS: 50.0000 mg | ORAL_TABLET | Freq: Every morning | ORAL | 3 refills | Status: DC

## 2022-02-19 MED ORDER — METOPROLOL SUCCINATE ER 25 MG PO TB24: 25.0000 mg | ORAL_TABLET | Freq: Every evening | ORAL | 3 refills | Status: DC

## 2022-02-19 NOTE — Progress Notes (Signed)
? ?Cardiology Office Note   ? ?Date:  02/19/2022  ? ?ID:  Eric Simmons., DOB Mar 20, 1941, MRN [id number] ? ?PCP:  [name], MD  ?Cardiologist:  [name], MD  ? ?Chief Complaint  ?Patient presents with  ? Coronary Artery Disease  ? Hyperlipidemia  ? ? ?History of Present Illness:  ?Eric Simmons. is a 81 y.o. male with a hx of HLD,  DOE and chest tightness working in the yard and walking. His father died at 56 of a massive MI. He underwent nuclear stress test 03/2020 showing no ischemia and normal LVF.  2D echo showed normal LVF and mildly leaky MV.   2D echo done 04/17/2020 showed normal LV function with mild MR.  He underwent cardiac cath 01/12/2022 which showed 99% oRCA, 85% mRCA and 60% pLAD with L>R collaterals.  Medical management was recommended.   ? ?When I last saw him he had had several episodes of chest pressure mainly with exertion.  The first episode was at the gym when he was walking a long distance to get into the building.  He rested and it went away.  There other time was when he was walking up a hill.  The pain resolved with rest.  He used to walk about an hour but now has cut back to 30 min because he gets tired.  We increase his Imdur to 60 mg daily and he is now back for follow-up.  ? ?He is here today for followup and is doing well.  He denies any chest pain or pressure, SOB, DOE, PND, orthopnea, LE edema, dizziness, palpitations or syncope. He is compliant with his meds and is tolerating meds with no SE.   ?  ?Past Medical History:  ?Diagnosis Date  ? Adenomatous colon polyp 03/2005  ? tubular adenoma  ? Aortic atherosclerosis ([place])   ? BCC (basal cell carcinoma), face   ? left face, R ear (04/2014), nose 05/2016  ? Benign familial tremor 08/29/2012  ? Coronary artery disease due to lipid rich plaque   ? cardiac cath 01/12/2022 which showed 99% oRCA, 85% mRCA and 60% pLAD with L>R collaterals.  Medical management was recommended.  ? Erectile dysfunction   ? Hemorrhoids   ? internal and  external  ? Hip arthritis   ? left  ? IFG (impaired fasting glucose) 12/10/2012  ? Glucose 111 11/2012   ? Insomnia   ? Mild mitral regurgitation   ? Mild neurocognitive disorder 09/11/2019  ? Pseudogout of knee 07/2003  ? left  ? Pulmonary nodule   ? Pure hypercholesterolemia   ? Syncope   ? ? ?Past Surgical History:  ?Procedure Laterality Date  ? COLONOSCOPY  6/06, 05/2010  ? Dr. [name]  ? INTRAVASCULAR PRESSURE WIRE/FFR STUDY N/A 01/12/2022  ? Procedure: INTRAVASCULAR PRESSURE WIRE/FFR STUDY;  Surgeon: Early Osmond, MD;  Location: [facility];  Service: Cardiovascular;  Laterality: N/A;  ? LEFT HEART CATH AND CORONARY ANGIOGRAPHY N/A 01/12/2022  ? Procedure: LEFT HEART CATH AND CORONARY ANGIOGRAPHY;  Surgeon: Early Osmond, MD;  Location: [facility] [facility];  Service: Cardiovascular;  Laterality: N/A;  ? MOHS SURGERY  6/07  ? L face, Dr. Link Snuffer  ? POLYPECTOMY    ? TONSILLECTOMY    ? TOTAL HIP ARTHROPLASTY Left 04/18/2021  ? Procedure: LEFT TOTAL HIP ARTHROPLASTY ANTERIOR APPROACH;  Surgeon: Melrose Nakayama, MD;  Location: [facility];  Service: Orthopedics;  Laterality: Left;  ? VASECTOMY    ? ? ?  Current Medications: ?Current Meds  ?Medication Sig  ? acetaminophen (TYLENOL) 500 MG tablet Take 500 mg by mouth every 6 (six) hours as needed.  ? acetaminophen (TYLENOL) 650 MG CR tablet Take 650 mg by mouth every 8 (eight) hours as needed for pain.  ? amLODipine (NORVASC) 5 MG tablet Take 1 tablet (5 mg total) by mouth daily.  ? Cholecalciferol (VITAMIN D3) 50 MCG (2000 UT) TABS Take 1 tablet by mouth daily.  ? Cyanocobalamin (B-12) 2500 MCG TABS Take 2,500 mcg by mouth daily.  ? finasteride (PROSCAR) 5 MG tablet Take 5 mg by mouth at bedtime.  ? isosorbide mononitrate (IMDUR) 60 MG 24 hr tablet Take 1 tablet (60 mg total) by mouth daily.  ? Lidocaine-Glycerin (PREPARATION H EX) Place 1 application. rectally daily as needed (irritation).  ? losartan (COZAAR) 50 MG tablet Take 1 tablet (50 mg total) by mouth  daily.  ? metoprolol succinate (TOPROL-XL) 25 MG 24 hr tablet TAKE ONE TABLET DAILY  ? nitroGLYCERIN (NITROSTAT) 0.4 MG SL tablet Place 1 tablet (0.4 mg total) under the tongue every 5 (five) minutes as needed for chest pain.  ? primidone (MYSOLINE) 50 MG tablet TAKE ONE TABLET AT BEDTIME  ? Propylene Glycol (SYSTANE COMPLETE) 0.6 % SOLN Place 1 drop into both eyes daily as needed (dry eyes).  ? rosuvastatin (CRESTOR) 20 MG tablet Take 1 tablet (20 mg total) by mouth daily.  ? ? ?Allergies:   Codeine  ? ?Social History  ? ?Socioeconomic History  ? Marital status: Married  ?  Spouse name: Not on file  ? Number of children: 2  ? Years of education: 69  ? Highest education level: Bachelor's degree (e.g., BA, AB, BS)  ?Occupational History  ? Occupation: retired  ?Tobacco Use  ? Smoking status: Former  ?  Types: Cigars  ? Smokeless tobacco: Never  ? Tobacco comments:  ?  as a teenager  ?Vaping Use  ? Vaping Use: Never used  ?Substance and Sexual Activity  ? Alcohol use: Yes  ?  Alcohol/week: 0.0 standard drinks  ?  Comment: 5-6 ounces of [place] wine or beer 6-7 x/week   ? Drug use: No  ? Sexual activity: Yes  ?Other Topics Concern  ? Not on file  ?Social History Narrative  ? Widowed 2013 (after wife's long battle with MS).  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  Children live in [place], [place] and [place].  ? Married March 2016 [name])  ? Right handed  ? Drinks caffeine  ? Two story home  ? ?Social Determinants of Health  ? ?Financial Resource Strain: Not on file  ?Food Insecurity: Not on file  ?Transportation Needs: Not on file  ?Physical Activity: Not on file  ?Stress: Not on file  ?Social Connections: Not on file  ?  ? ?Family History:  The patient's family history includes Cancer (age of onset: 48) in his mother; Colon cancer in his mother; Colon polyps in his mother; Diabetes in his  mother; Heart disease in his brother, father, and son; Hyperlipidemia in his brother, brother, brother, son, and son; Hypertension in his father; Sleep apnea in his son; Syncope episode in his son; Tremor in his brother and brother.  ? ?ROS:   ?Please see the history of present illness.    ?ROS All other systems reviewed and are negative. ? ?   ?  View : No data to display.  ?  ?  ?  ? ? ? ? ? ?PHYSICAL EXAM:   ?VS:  BP 118/80   Pulse 64   Ht 6' (1.829 m)   Wt 180 lb 6.4 oz (81.8 kg)   SpO2 93%   BMI 24.47 kg/m?    ?GEN: Well nourished, well developed in no acute distress ?HEENT: Normal ?NECK: No JVD; No carotid bruits ?LYMPHATICS: No lymphadenopathy ?CARDIAC:RRR, no murmurs, rubs, gallops ?RESPIRATORY:  Clear to auscultation without rales, wheezing or rhonchi  ?ABDOMEN: Soft, non-tender, non-distended ?MUSCULOSKELETAL:  No edema; No deformity  ?SKIN: Warm and dry ?NEUROLOGIC:  Alert and oriented x 3 ?PSYCHIATRIC:  Normal affect   ?Wt Readings from Last 3 Encounters:  ?02/19/22 180 lb 6.4 oz (81.8 kg)  ?01/30/22 183 lb 6.4 oz (83.2 kg)  ?01/12/22 175 lb (79.4 kg)  ?  ? ? ?Studies/Labs Reviewed:  ? ?EKG:  EKG is not ordered today  ? ?Recent Labs: ?01/09/2022: BUN 18; Creatinine, Ser 1.19; Hemoglobin 14.5; Platelets 218; Potassium 4.7; Sodium 138 ?02/01/2022: ALT 19  ? ?Lipid Panel ?   ?Component Value Date/Time  ? CHOL 130 02/01/2022 0725  ? TRIG 79 02/01/2022 0725  ? HDL 47 02/01/2022 0725  ? CHOLHDL 2.8 02/01/2022 0725  ? CHOLHDL 2.9 10/30/2017 0944  ? VLDL 17 08/20/2016 1051  ? [place] 67 02/01/2022 0725  ? [place] 80 10/30/2017 0944  ? ? ?Additional studies/ records that were reviewed today include:  ?2D echo 03/2020 ?IMPRESSIONS  ? ? ? 1. Left ventricular ejection fraction, by estimation, is 60 to 65%. The  ?left ventricle has normal function. The left ventricle has no regional  ?wall motion abnormalities. Left ventricular diastolic parameters are  ?indeterminate.  ? 2. Right ventricular systolic function is  normal. The right ventricular  ?size is normal. There is normal pulmonary artery systolic pressure. The  ?estimated right ventricular systolic pressure is 55.3 mmHg.  ? 3. The mitral valve is grossly normal. Mild

## 2022-02-19 NOTE — Patient Instructions (Signed)
Medication Instructions:  ?Your physician has recommended you make the following change in your medication:  ?1) START taking Zetia 10 mg daily  ?2) START taking Aspirin 81 mg daily  ?3) CHANGE Imdur (isosorbide) to 30 mg every morning and 15 mg every night ? ?*If you need a refill on your cardiac medications before your next appointment, please call your pharmacy* ? ?Lab Work: ?Fasting lipids and ALT in 6 weeks ? ?If you have labs (blood work) drawn today and your tests are completely normal, you will receive your results only by: ?MyChart Message (if you have MyChart) OR ?A paper copy in the mail ?If you have any lab test that is abnormal or we need to change your treatment, we will call you to review the results. ? ?Follow-Up: ?At [facility], you and your health needs are our priority.  As part of our continuing mission to provide you with exceptional heart care, we have created designated Provider Care Teams.  These Care Teams include your primary Cardiologist (physician) and Advanced Practice Providers (APPs -  Physician Assistants and Nurse Practitioners) who all work together to provide you with the care you need, when you need it. ? ?Your next appointment:   ?3 month(s) ? ?The format for your next appointment:   ?In Person ? ?Provider:   ?[name], MD   ? ?Other Instructions ?You have been referred to see PharmD in the [facility] ? ?Important Information About Sugar ? ? ? ? ?  ?

## 2022-02-19 NOTE — Addendum Note (Signed)
Addended by: Antonieta Iba on: 02/19/2022 11:04 AM ? ? Modules accepted: Orders ? ?

## 2022-03-06 ENCOUNTER — Ambulatory Visit: Payer: PPO | Admitting: Cardiology

## 2022-03-14 NOTE — Progress Notes (Unsigned)
Patient ID: Eric Simmons.                 DOB: Feb 22, 1941                    MRN: [id number]     HPI: Eric Simmons. is a pleasant 81 y.o. male referred by Dr. Radford Pax to lipid clinic. PMH is significant for hyperlipidemia, hypertension, and DOE/chest discomfort with exertion. Nuclear stress test 03/2020 showed no ischemia and normal LVEF. He underwent cardiac cath 01/12/2022 which showed 99% oRCA, 85% mRCA and 60% pLAD with L>R collaterals. Medical management was recommended.  Since cath, had several episodes of chest pressure with exertion, resulting in increase in isosorbide mononitrate to 60 mg daily. Most recently seen by Dr. Radford Pax on 02/19/22 where ezetimibe 10 mg daily was added to his regimen for HLD.    Notes - seen for HTN management last year  - 02/19/22: added zetia to 10 mg daily-> repeat FLP and ALT in 8 weeks.  - changed rosuva to simva at pharmacist visit in 2022 - due for lipid panel ~6/24  Current Medications: rosuvastatin 20 mg daily, ezetimibe 10 mg daily Intolerances: simvastatin 40 mg (interaction with amlodipine) Risk Factors: ASCVD, HTN, family hx,  LDL goal: <70 mg/dL  Diet: UPDATE Breakfast - Raisin Bran cereal, small donut, small glass of OJ with AM meds, decaf coffee Lunch - ham and cheese sandwich, fruit, cookie, caffeine free Diet Coke Dinner - ham, tuna, chicken, occasionally beef; always has a vegetable, sometimes potatoes; iced tea; 1 glass Nicolson wine or beer Eats out about once a week. Doesn't add salt to food  Ex[facility]: [facility] [place] 1.5 + miles  Family History: Father died at 69 from massive MI. Cancer (age of onset: 67) in his mother; Colon cancer in his mother; Colon polyps in his mother; Diabetes in his mother; Heart disease in his brother, father, and son; Hyperlipidemia in his brother, brother, brother, son, and son; Hypertension in his father; Sleep apnea in his son; Syncope episode in his son; Tremor in his brother  and brother  Social History: UPDATE Tobacco use as a teenager, 5-6oz Proto wine or beer 5-6x/week  Labs: 02/01/22: TC 130, TG 79, HDL 47, VLDL 16, LDL-Calc 67, Chol/HDL 2.8 (rosuvastatin 20 mg) 02/01/22: AST and ALT wnl 03/29/21: TC 144, TG 101, HDL 48, VLDL 19, LDL-Calc 77, Chol/HDL 3.0 (simvastatin 40 mg)   Past Medical History:  Diagnosis Date   Adenomatous colon polyp 03/2005   tubular adenoma   Aortic atherosclerosis (HCC)    BCC (basal cell carcinoma), face    left face, R ear (04/2014), nose 05/2016   Benign familial tremor 08/29/2012   Coronary artery disease due to lipid rich plaque    cardiac cath 01/12/2022 which showed 99% oRCA, 85% mRCA and 60% pLAD with L>R collaterals.  Medical management was recommended.   Erectile dysfunction    Hemorrhoids    internal and external   Hip arthritis    left   IFG (impaired fasting glucose) 12/10/2012   Glucose 111 11/2012    Insomnia    Mild mitral regurgitation    Mild neurocognitive disorder 09/11/2019   Pseudogout of knee 07/2003   left   Pulmonary nodule    Pure hypercholesterolemia    Syncope     Current Outpatient Medications on File Prior to Visit  Medication Sig Dispense Refill   acetaminophen (TYLENOL) 500 MG  tablet Take 500 mg by mouth every 6 (six) hours as needed.     acetaminophen (TYLENOL) 650 MG CR tablet Take 650 mg by mouth every 8 (eight) hours as needed for pain.     amLODipine (NORVASC) 5 MG tablet Take 1 tablet (5 mg total) by mouth daily at noon. 90 tablet 3   aspirin EC 81 MG tablet Take 1 tablet (81 mg total) by mouth in the morning. Swallow whole. 90 tablet 3   Cholecalciferol (VITAMIN D3) 50 MCG (2000 UT) TABS Take 1 tablet by mouth daily.     Cyanocobalamin (B-12) 2500 MCG TABS Take 2,500 mcg by mouth daily.     ezetimibe (ZETIA) 10 MG tablet Take 1 tablet (10 mg total) by mouth daily. 90 tablet 3   finasteride (PROSCAR) 5 MG tablet Take 5 mg by mouth at bedtime.     isosorbide mononitrate (IMDUR) 30 MG  24 hr tablet Take 1 tablet (30 mg total) by mouth every morning. Take 1/2 tablet (15 mg total) every night. 135 tablet 3   Lidocaine-Glycerin (PREPARATION H EX) Place 1 application. rectally daily as needed (irritation).     losartan (COZAAR) 50 MG tablet Take 1 tablet (50 mg total) by mouth in the morning. 90 tablet 3   metoprolol succinate (TOPROL-XL) 25 MG 24 hr tablet Take 1 tablet (25 mg total) by mouth at bedtime. 90 tablet 3   nitroGLYCERIN (NITROSTAT) 0.4 MG SL tablet Place 1 tablet (0.4 mg total) under the tongue every 5 (five) minutes as needed for chest pain. 30 tablet 3   primidone (MYSOLINE) 50 MG tablet TAKE ONE TABLET AT BEDTIME 90 tablet 1   Propylene Glycol (SYSTANE COMPLETE) 0.6 % SOLN Place 1 drop into both eyes daily as needed (dry eyes).     rosuvastatin (CRESTOR) 20 MG tablet Take 1 tablet (20 mg total) by mouth daily. 90 tablet 3   No current facility-administered medications on file prior to visit.    Allergies  Allergen Reactions   Codeine Nausea And Vomiting    violent vomiting    Assessment/Plan:  1. Hyperlipidemia -

## 2022-03-15 ENCOUNTER — Ambulatory Visit: Payer: PPO

## 2022-03-15 ENCOUNTER — Telehealth: Payer: Self-pay | Admitting: Pharmacist

## 2022-03-15 NOTE — Telephone Encounter (Addendum)
Pt had been scheduled for lipid appt this AM. LDL 67 on rosuvastatin '20mg'$  daily in April. After, ezetimibe '10mg'$  daily was started to target LDL goal < 55 and pt has follow up lab work scheduled in June. He's tolerating his meds well, will cancel PharmD appt for now as addition of ezetimibe should bring LDL to goal. If not, will plan to reschedule visit.

## 2022-03-29 ENCOUNTER — Encounter: Payer: PPO | Admitting: Psychology

## 2022-04-02 ENCOUNTER — Ambulatory Visit: Payer: PPO | Admitting: Family Medicine

## 2022-04-02 ENCOUNTER — Other Ambulatory Visit: Payer: PPO | Admitting: *Deleted

## 2022-04-02 DIAGNOSIS — I10 Essential (primary) hypertension: Secondary | ICD-10-CM | POA: Diagnosis not present

## 2022-04-02 DIAGNOSIS — I2583 Coronary atherosclerosis due to lipid rich plaque: Secondary | ICD-10-CM | POA: Diagnosis not present

## 2022-04-02 DIAGNOSIS — E7800 Pure hypercholesterolemia, unspecified: Secondary | ICD-10-CM

## 2022-04-02 DIAGNOSIS — I2510 Atherosclerotic heart disease of native coronary artery without angina pectoris: Secondary | ICD-10-CM

## 2022-04-02 LAB — LIPID PANEL
Chol/HDL Ratio: 2.3 ratio (ref 0.0–5.0)
Cholesterol, Total: 100 mg/dL (ref 100–199)
HDL: 43 mg/dL (ref >39)
LDL Chol Calc (NIH): 37 mg/dL (ref 0–99)
Triglycerides: 105 mg/dL (ref 0–149)
VLDL Cholesterol Cal: 20 mg/dL (ref 5–40)

## 2022-04-02 LAB — ALT: ALT: 34 IU/L (ref 0–44)

## 2022-04-16 ENCOUNTER — Encounter: Payer: PPO | Admitting: Psychology

## 2022-04-16 DIAGNOSIS — H5203 Hypermetropia, bilateral: Secondary | ICD-10-CM | POA: Diagnosis not present

## 2022-04-16 DIAGNOSIS — D3132 Benign neoplasm of left choroid: Secondary | ICD-10-CM | POA: Diagnosis not present

## 2022-04-24 ENCOUNTER — Other Ambulatory Visit: Payer: Self-pay | Admitting: Cardiology

## 2022-05-02 NOTE — Patient Instructions (Incomplete)
  HEALTH MAINTENANCE RECOMMENDATIONS:  It is recommended that you get at least 30 minutes of aerobic exercise at least 5 days/week (for weight loss, you may need as much as 60-90 minutes). This can be any activity that gets your heart rate up. This can be divided in 10-15 minute intervals if needed, but try and build up your endurance at least once a week.  Weight bearing exercise is also recommended twice weekly.  Eat a healthy diet with lots of vegetables, fruits and fiber.  "Colorful" foods have a lot of vitamins (ie green vegetables, tomatoes, red peppers, etc).  Limit sweet tea, regular sodas and alcoholic beverages, all of which has a lot of calories and sugar.  Up to 2 alcoholic drinks daily may be beneficial for men (unless trying to lose weight, watch sugars).  Drink a lot of water.  Sunscreen of at least SPF 30 should be used on all sun-exposed parts of the skin when outside between the hours of 10 am and 4 pm (not just when at beach or pool, but even with exercise, golf, tennis, and yard work!)  Use a sunscreen that says "broad spectrum" so it covers both UVA and UVB rays, and make sure to reapply every 1-2 hours.  Remember to change the batteries in your smoke detectors when changing your clock times in the spring and fall.  Carbon monoxide detectors are recommended for your home.  Use your seat belt every time you are in a car, and please drive safely and not be distracted with cell phones and texting while driving.    Eric Simmons , Thank you for taking time to come for your Medicare Wellness Visit. I appreciate your ongoing commitment to your health goals. Please review the following plan we discussed and let me know if I can assist you in the future.   This is a list of the screening recommended for you and due dates:  Health Maintenance  Topic Date Due   Zoster (Shingles) Vaccine (1 of 2) Never done   Tetanus Vaccine  03/01/2021   COVID-19 Vaccine (5 - Pfizer series) 05/24/2021    Flu Shot  05/29/2022   Pneumonia Vaccine  Completed   HPV Vaccine  Aged Out   Pneumonia booster (pneumovax) was given today. You do not need to get Prevnar-20 from the pharmacy. In 2 weeks (minimum), please get TdaP from the pharmacy.  I also recommend getting the new shingles vaccine (Shingrix). Since you have Medicare, you will need to get this from the pharmacy, as it is covered by Part D. There is no longer an out of pocket charge for this.  This is a series of 2 injections, spaced 2 months apart.   This should be separated from other vaccines by at least 2 weeks.  Continue to get high dose flu shots in the Fall (I recommend getting it when available in September). I also recommend that you get the updated bivalent COVID booster, when it becomes available in the Fall (you can get this the same day as the flu shot, if both available, or separate them by 2 weeks).

## 2022-05-02 NOTE — Progress Notes (Signed)
Chief Complaint  Patient presents with   Medicare Wellness    Nonfasting AWV (I left on his VM, said he didn't know). Wanted to let you know about his worsening memory issues. No other issues.     Eric Simmons. is a 81 y.o. male who presents for annual physical exam, Medicare wellness visit and follow-up on chronic medical conditions.   Since his last visit he had L THR. He has done very well with that, no pain since 6 weeks post-operatively.  HTN, CAD, HLD: He remains under the care of Dr. Radford Pax.  He had been cleared for that surgery. Since then, he developed some CP/DOE.  He had elevated calcium score, and subsequently underwent cardiac cath (12/2021, see results below). Last visit with Dr. Radford Pax was in April 2023.  He is on medical management.  He is tolerating regimen (aspirin, imdur, metoprolol, losartan, amlodipine) without side effects. He wasn't able to tolerate higher doses of imdur, doing okay on '30mg'$  in the morning and '15mg'$  at night.  No longer having any dizziness. Very rarely has some tightness in the chest (not even to the point where he needs NTG, r/b rest).  Denies DOE, shortness of breath. CT coronary scan 12/2021: IMPRESSION: 1. Heavy coronary calcium with severe multivessel and left main CAD, CADRADS = 4a. CT FFR will be performed and reported separately. 2. Coronary calcium score of 2461. This was 84th percentile for age and sex matched control. 3. Normal coronary origin with right dominance. 4. Aortic valve calcification.  Aortic atherosclerosis. 5. Definitive cardiac catheterization is recommended  Cath 12/2021:   Ost RCA lesion is 99% stenosed.   Mid RCA lesion is 85% stenosed.   Prox LAD lesion is 60% stenosed. 1.  Highly calcified subtotally occluded ostial right coronary artery lesion with calcified mid right coronary artery lesion. 2.  RFR positive proximal LAD lesion (0.88-0.89). 3.  LVEDP of 16 mmHg. Recommendation: Maximal medical therapy.  HTN: He  tries to follow low sodium diet.  Compliant with medications. BP's are ranging from 97-156/61-82, mostly 130/70 (average).  BP Readings from Last 3 Encounters:  05/03/22 110/70  02/19/22 118/80  01/30/22 122/64   Hyperlipidemia:   He is on Crestor, and zetia was added in 01/2022 to get LDL <55.  He denies side effects.  Recheck was at goal: Lab Results  Component Value Date   CHOL 100 04/02/2022   HDL 43 04/02/2022   LDLCALC 37 04/02/2022   TRIG 105 04/02/2022   CHOLHDL 2.3 04/02/2022    He also has aortic atherosclerosis.  Compliant with meds as above.  He is under the care of [facility] for mild neurocognitive disorder (had testing with Dr. Melvyn Novas), essential tremor and hand paresthesias.  He declined medications for memory.  He is doing well on primidone, dose adjustments declined.  He declined EMG/NCV and splinting for the paresthesias (suspected possible CTS). [facility] and resolves with moving them around. They also discussed his sialorrhea--he declined botox, atropine drops, discussed using lemon drops to try and stimulate swallowing. Wife states "he does not swallow".  Impaired fasting glucose.  This has been very mild, ongoing/nonprogressive for years.  He tries to limit his sweets. Admits to some sweets, but small portions. No polydipsia, polyuria (just nocturia). Lab Results  Component Value Date   HGBA1C 6.0 (H) 03/29/2021   BPH and ED:  He is under the care of Dr. Diona Fanti. Previously took tamsulosin, which was  stopped due to dizziness and changed to finasteride '5mg'$ . He reports this is working very well for him. He last saw urologist in 10/2021.  He gets up once or twice a night to void. He previously was prescribed sildenafil '100mg'$  prn for ED. It stopped being effective.  He is no longer having sexual relations, and is okay with this.   Immunization History  Administered Date(s) Administered   Fluad Quad(high Dose 65+)  07/16/2019, 07/06/2020, 10/02/2021   Influenza Split 07/18/2011, 08/28/2012   Influenza, High Dose Seasonal PF 08/19/2014, 06/22/2015, 08/20/2016, 08/20/2017   Influenza,inj,Quad PF,6+ Mos 07/06/2013   Influenza-Unspecified 07/25/2018   PFIZER(Purple Top)SARS-COV-2 Vaccination 11/18/2019, 12/07/2019, 08/02/2020, 03/29/2021   Pneumococcal Conjugate-13 06/14/2014   Pneumococcal Polysaccharide-23 12/13/2011   Tdap 03/02/2011   Zoster, Live 03/25/2014   Last colonoscopy: 12/2017 (Dr. Barnett Abu adenomas x 4. He was told no follow up was needed (due to age) Last PSA: (no longer done due to age) Lab Results  Component Value Date   PSA 1.5 10/30/2017   PSA 1.73 12/27/2015   PSA 1.87 12/06/2014   Dentist: twice yearly   Ophtho: yearly  Exercise:   walking 2 miles on cushioned track at [facility] 4x/week. Weights 3-4x/week.  1.5-2 mile walks in the neighborhood occasionally.   Patient Care Team: [name], MD as PCP - General (Family Medicine) [name], MD as PCP - Cardiology (Cardiology) Viona Gilmore, [facility] as Pharmacist (Pharmacist) Jola Schmidt, MD as Consulting Physician (Ophthalmology) Gatha Mayer, MD as Consulting Physician (Gastroenterology) Jarome Matin, MD as Consulting Physician (Dermatology) Melrose Nakayama, MD as Consulting Physician (Orthopedic Surgery) Tat, Eustace Quail, DO as Consulting Physician (Neurology) Franchot Gallo, MD as Consulting Physician (Urology) Jacquelynn Cree, PT as Physical Therapist (Physical Therapy) Dentist: Dr. Lonia Chimera    Depression Screening: [place] Visit from 05/03/2022 in [facility]  PHQ-2 Total Score 0        Falls screen:     05/03/2022    9:47 AM 10/16/2021   11:19 AM 03/29/2021    1:49 PM 03/29/2021    1:34 PM 01/05/2021    2:05 PM  [place] in the past year? 0 0 1 0 0  Number falls in past yr: 0 0  0   Injury with Fall? 0 0  0   Risk for fall due to : No Fall Risks    No Fall Risks   Follow up Falls evaluation completed   Falls evaluation completed      Functional Status Survey: Is the patient deaf or have difficulty hearing?: No Does the patient have difficulty seeing, even when wearing glasses/contacts?: No Does the patient have difficulty concentrating, remembering, or making decisions?: Yes (memory) Does the patient have difficulty walking or climbing stairs?: No Does the patient have difficulty dressing or bathing?: No Does the patient have difficulty doing errands alone such as visiting a doctor's office or shopping?: No (still drives just not on highway)  Mini-Cog Scoring: 3  clock drawing incorrect (numbers outside of circle, no hands on clock--made darker marks for the time).  3/3 recall. (Couldn't recall later in the visit)      End of Life Discussion:  Patient has a living will and medical power of attorney, scanned.    PMH, PSH, SH and FH reviewed and updated  Outpatient Encounter Medications as of 05/03/2022  Medication Sig Note   amLODipine (NORVASC) 5 MG tablet Take 1 tablet (5 mg total) by mouth  daily at noon.    aspirin EC 81 MG tablet Take 1 tablet (81 mg total) by mouth in the morning. Swallow whole.    Cholecalciferol (VITAMIN D3) 50 MCG (2000 UT) TABS Take 1 tablet by mouth daily.    ezetimibe (ZETIA) 10 MG tablet Take 1 tablet (10 mg total) by mouth daily.    finasteride (PROSCAR) 5 MG tablet Take 5 mg by mouth at bedtime.    isosorbide mononitrate (IMDUR) 30 MG 24 hr tablet Take 1 tablet (30 mg total) by mouth every morning. Take 1/2 tablet (15 mg total) every night.    losartan (COZAAR) 50 MG tablet Take 1 tablet (50 mg total) by mouth in the morning.    metoprolol succinate (TOPROL-XL) 25 MG 24 hr tablet Take 1 tablet (25 mg total) by mouth at bedtime.    primidone (MYSOLINE) 50 MG tablet TAKE ONE TABLET AT BEDTIME    rosuvastatin (CRESTOR) 20 MG tablet TAKE ONE TABLET BY MOUTH ONCE DAILY    acetaminophen (TYLENOL) 500 MG  tablet Take 500 mg by mouth every 6 (six) hours as needed. (Patient not taking: Reported on 05/03/2022) 05/03/2022: prn   acetaminophen (TYLENOL) 650 MG CR tablet Take 650 mg by mouth every 8 (eight) hours as needed for pain. (Patient not taking: Reported on 05/03/2022) 05/03/2022: prn   Lidocaine-Glycerin (PREPARATION H EX) Place 1 application. rectally daily as needed (irritation). (Patient not taking: Reported on 05/03/2022) 05/03/2022: prn   nitroGLYCERIN (NITROSTAT) 0.4 MG SL tablet Place 1 tablet (0.4 mg total) under the tongue every 5 (five) minutes as needed for chest pain. (Patient not taking: Reported on 05/03/2022)    Propylene Glycol (SYSTANE COMPLETE) 0.6 % SOLN Place 1 drop into both eyes daily as needed (dry eyes). (Patient not taking: Reported on 05/03/2022) 05/03/2022: prn   [DISCONTINUED] Cyanocobalamin (B-12) 2500 MCG TABS Take 2,500 mcg by mouth daily.    No facility-administered encounter medications on file as of 05/03/2022.   Allergies  Allergen Reactions   Codeine Nausea And Vomiting    violent vomiting    ROS: The patient denies anorexia, fever, weight changes, headaches, decreased hearing, ear pain, hoarseness, chest pain, palpitations, dizziness, syncope, dyspnea on exertion, cough, swelling, nausea, vomiting, diarrhea, abdominal pain, melena, hematochezia, indigestion/heartburn, hematuria, dysuria, genital lesions, joint pains, weakness, suspicious skin lesions (sees derm regularly, gets treated), depression, anxiety, insomnia, abnormal bleeding/bruising, or enlarged lymph nodes   +ED and BPH per HPI, up 1-2x/night. [facility] [employer], short-lived. Tremor in right hand persists, despite medications, mild. No joint pains. Rare short-lived chest pressure per HPI.  Denies DOE.    PHYSICAL EXAM:  BP 110/70   Pulse 64   Ht 6' 0.5" (1.842 m)   Wt 181 lb 6.4 oz (82.3 kg)   BMI 24.26 kg/m   Wt Readings from Last 3 Encounters:  05/03/22 181 lb 6.4 oz (82.3  kg)  02/19/22 180 lb 6.4 oz (81.8 kg)  01/30/22 183 lb 6.4 oz (83.2 kg)    General Appearance:   Alert, cooperative, no distress, appears stated age    Head:   Normocephalic, without obvious abnormality, atraumatic    Eyes:   PERRL, conjunctiva/corneas clear, EOM's intact, fundi benign. Brown pigmentation to upper portion of R eye (rest is blue), unchanged    Ears:   Normal TM's and external ear canals.   Nose:   Normal, no drainage or sinus tenderness  Throat:   Normal mucosa, no lesions  Neck:  Supple, no lymphadenopathy; thyroid: no enlargement/tenderness/nodules; no carotid bruit or JVD. C-spine nontender  Back:   Spine nontender, no curvature, ROM normal, no CVA tenderness    Lungs:   Clear to auscultation bilaterally without wheezes, rales or ronchi; respirations unlabored    Chest Wall:   No tenderness or deformity    Heart:   Regular rate and rhythm, S1 and S2 normal, no murmur, rub or gallop    Breast Exam:  No chest wall tenderness, masses or gynecomastia    Abdomen:   Soft, non-tender, nondistended, normoactive bowel sounds, no masses, no hepatosplenomegaly    Genitalia:   Deferred to urologist  Rectal:   Deferred to urologist  Extremities:   No clubbing, cyanosis or edema.   Pulses:   2+ and symmetric all extremities    Skin:   Skin color, turgor normal, no rashes. Skin is dry, especially on arms and legs, with some actinic changes.  Lymph  nodes:   Cervical, supraclavicular, inguinal nodes normal    Neurologic:   Normal strength, sensation and gait; reflexes 2+ and symmetric throughout. Did not appreciate any resting tremor, mild intentional tremor, apparent in his handwriting as well.              Psych:    Normal mood, affect, hygiene and grooming   Lab Results  Component Value Date   HGBA1C 5.8 (A) 05/03/2022     ASSESSMENT/PLAN:  Annual physical exam  Medicare annual wellness visit, subsequent  Mild neurocognitive disorder, concerns for Alzheimer's disease -  under care of neuro.  Briefly discussed role of meds (preventing decline, rather than waiting to be worse and reversing/improving). Will f/u with neuro  Benign prostatic hyperplasia with nocturia - doing well on current treatment per urologist  IFG (impaired fasting glucose) - improved.  Encouraged to limit sweets/sugar and continue regular exercise - Plan: HgB A1c  Essential hypertension - well controlled on current regimen  Pure hypercholesterolemia - at goal on current regimen, per cardiology.  Coronary artery disease involving native coronary artery of native heart with angina pectoris [facility]) - on medical management per cardiology.  Reminded him of the need to have NTG that isn't expired. Hasn't need to use any  Need for pneumococcal vaccination - Plan: Pneumococcal polysaccharide vaccine 23-valent greater than or equal to 2yo subcutaneous/IM  Has had recent b-mets, ALT, lipids and CBC. Reviewed.  Recommended at least 30 minutes of aerobic activity at least 5 days/week, weight bearing exercise at least 2x/week; proper sunscreen use reviewed; healthy diet and alcohol recommendations (less than or equal to 2 drinks/day) reviewed; regular seatbelt use; changing batteries in smoke detectors. Immunization recommendations discussed--continue high dose flu shots yearly. Updated bivalent COVID booster is recommended when it becomes available in the Fall . Tdap past due, to get from pharmacy after 2 weeks. Shingrix recommended; risks/SE reviewed, to get from pharmacy.  Timing of vaccines reviewed. Colonoscopy is UTD, no longer needed   MOST form reviewed and updated.  Full Code, Full Care. New forms given for Living Will and healthcare power of attorney.  We have forms in chart, listing his son Dorothea Ogle. They mentioned possibly wanting to have Eric Simmons (wife) listed as well.  F/u 1 year, sooner prn.   Medicare Attestation I have personally reviewed: The patient's medical and social history Their use  of alcohol, tobacco or illicit drugs Their current medications and supplements The patient's functional ability including ADLs,fall risks, home safety risks, cognitive, and hearing and visual impairment Diet and  physical activities Evidence for depression or mood disorders  The patient's weight, height, BMI have been recorded in the chart.  I have made referrals, counseling, and provided education to the patient based on review of the above and I have provided the patient with a written personalized care plan for preventive services.

## 2022-05-03 ENCOUNTER — Encounter: Payer: Self-pay | Admitting: Family Medicine

## 2022-05-03 ENCOUNTER — Ambulatory Visit (INDEPENDENT_AMBULATORY_CARE_PROVIDER_SITE_OTHER): Payer: PPO | Admitting: Family Medicine

## 2022-05-03 VITALS — BP 110/70 | HR 64 | Ht 72.5 in | Wt 181.4 lb

## 2022-05-03 DIAGNOSIS — I10 Essential (primary) hypertension: Secondary | ICD-10-CM | POA: Diagnosis not present

## 2022-05-03 DIAGNOSIS — G3184 Mild cognitive impairment, so stated: Secondary | ICD-10-CM

## 2022-05-03 DIAGNOSIS — E7800 Pure hypercholesterolemia, unspecified: Secondary | ICD-10-CM | POA: Diagnosis not present

## 2022-05-03 DIAGNOSIS — Z23 Encounter for immunization: Secondary | ICD-10-CM | POA: Diagnosis not present

## 2022-05-03 DIAGNOSIS — N401 Enlarged prostate with lower urinary tract symptoms: Secondary | ICD-10-CM | POA: Diagnosis not present

## 2022-05-03 DIAGNOSIS — Z0000 Encounter for general adult medical examination without abnormal findings: Secondary | ICD-10-CM | POA: Diagnosis not present

## 2022-05-03 DIAGNOSIS — R351 Nocturia: Secondary | ICD-10-CM

## 2022-05-03 DIAGNOSIS — R7301 Impaired fasting glucose: Secondary | ICD-10-CM | POA: Diagnosis not present

## 2022-05-03 DIAGNOSIS — I25119 Atherosclerotic heart disease of native coronary artery with unspecified angina pectoris: Secondary | ICD-10-CM | POA: Diagnosis not present

## 2022-05-03 LAB — POCT GLYCOSYLATED HEMOGLOBIN (HGB A1C): Hemoglobin A1C: 5.8 % — AB (ref 4.0–5.6)

## 2022-05-28 ENCOUNTER — Ambulatory Visit (INDEPENDENT_AMBULATORY_CARE_PROVIDER_SITE_OTHER): Payer: PPO | Admitting: Medical

## 2022-05-28 VITALS — BP 110/58 | HR 68 | Temp 97.8°F | Wt 180.8 lb

## 2022-05-28 DIAGNOSIS — L237 Allergic contact dermatitis due to plants, except food: Secondary | ICD-10-CM | POA: Diagnosis not present

## 2022-05-28 DIAGNOSIS — R21 Rash and other nonspecific skin eruption: Secondary | ICD-10-CM

## 2022-05-28 MED ORDER — METHYLPREDNISOLONE ACETATE 40 MG/ML IJ SUSP
80.0000 mg | Freq: Once | INTRAMUSCULAR | Status: AC
  Administered 2022-05-28: 80 mg via INTRAMUSCULAR

## 2022-05-28 MED ORDER — HYDROXYZINE HCL 10 MG PO TABS: 10.0000 mg | ORAL_TABLET | Freq: Three times a day (TID) | ORAL | 0 refills | Status: DC | PRN

## 2022-05-28 MED ORDER — TRIAMCINOLONE ACETONIDE 0.1 % EX CREA: 1.0000 | TOPICAL_CREAM | Freq: Two times a day (BID) | CUTANEOUS | 0 refills | Status: DC

## 2022-05-28 NOTE — Progress Notes (Signed)
Subjective:   Eric Simmons. is a 81 y.o. male who presents for evaluation of a rash involving the bilat arms and right lateral leg.  Concerned for poison ivy as they have recent exposure.  Was doing yard work a week ago and got exposed.  Rash started a week ago and hasn't resolved despite 1% hydrocoritions crema OTC. No other aggravating or relieving factors.  No other c/o.   The following portions of the patient's history were reviewed and updated as appropriate: allergies, current medications, past family history, past medical history, past social history and problem list.  Review of Systems As in subjective above   Objective:   Gen: wd, wn, nad Skin: Patch of erythema/pink coloration on bilateral antecubital regions and right lateral leg above the knee.  All seems urticarial.  No vesicles or blisters.   Assessment:   Encounter Diagnoses  Name Primary?   Poison ivy dermatitis Yes   Rash       Plan:   Discussed symptoms and exam findings, diagnosis, treatment options.  Etiology appears to be poison ivy dermatitis.  80 mg Depo-Medrol given IM in office.   Prescribed: triamcinolone steroid cream to apply topically daily for the next several days until this resolves.  Discussed washing contaminated clothing on the hot cycle in the washing machine, washing gloves, shoes, or other utensils in hot soapy water.  Avoid re- exposure.  Discussed signs of infection or worsening symptoms that would prompt recheck.   Can use hydroxyzine prn for itching  Selig was seen today for poison oak.  Diagnoses and all orders for this visit:  Poison ivy dermatitis  Rash  Other orders -     triamcinolone cream (KENALOG) 0.1 %; Apply 1 Application topically 2 (two) times daily. -     hydrOXYzine (ATARAX) 10 MG tablet; Take 1 tablet (10 mg total) by mouth 3 (three) times daily as needed.    Follow up prn, or if not much improved in the next 4-5 days.

## 2022-05-28 NOTE — Addendum Note (Signed)
Addended by: Carolee Rota F on: 05/28/2022 10:31 AM   Modules accepted: Orders

## 2022-06-04 NOTE — Progress Notes (Unsigned)
Cardiology Office Note    Date:  06/05/2022   ID:  Eric Hackler., DOB 1940/12/16, MRN [id number]  PCP:  [name], MD  Cardiologist:  [name], MD   Chief Complaint  Patient presents with   Coronary Artery Disease   Hypertension   Hyperlipidemia    History of Present Illness:  Eric Auxier. is a 81 y.o. male with a hx of HLD,  DOE and chest tightness working in the yard and walking. His father died at 69 of a massive MI. He underwent nuclear stress test 03/2020 showing no ischemia and normal LVF.  2D echo showed normal LVF and mildly leaky MV.   2D echo done 04/17/2020 showed normal LV function with mild MR.  He underwent cardiac cath 01/12/2022 which showed 99% oRCA, 85% mRCA and 60% pLAD with L>R collaterals.  Medical management was recommended.    He is here today for followup and is doing well.  He denies any chest pain or pressure, SOB, DOE, PND, orthopnea, LE edema, dizziness, palpitations or syncope. He is compliant with his meds and is tolerating meds with no SE.      Past Medical History:  Diagnosis Date   Adenomatous colon polyp 03/2005   tubular adenoma   Aortic atherosclerosis (HCC)    BCC (basal cell carcinoma), face    left face, R ear (04/2014), nose 05/2016   Benign familial tremor 08/29/2012   Coronary artery disease due to lipid rich plaque    cardiac cath 01/12/2022 which showed 99% oRCA, 85% mRCA and 60% pLAD with L>R collaterals.  Medical management was recommended.   Erectile dysfunction    Hemorrhoids    internal and external   Hip arthritis    left   IFG (impaired fasting glucose) 12/10/2012   Glucose 111 11/2012    Insomnia    Mild mitral regurgitation    Mild neurocognitive disorder 09/11/2019   Pseudogout of knee 07/2003   left   Pulmonary nodule    Pure hypercholesterolemia    Syncope     Past Surgical History:  Procedure Laterality Date   COLONOSCOPY  6/06, 05/2010   Dr. [name]   INTRAVASCULAR PRESSURE WIRE/FFR STUDY N/A  01/12/2022   Procedure: INTRAVASCULAR PRESSURE WIRE/FFR STUDY;  Surgeon: Early Osmond, MD;  Location: [facility];  Service: Cardiovascular;  Laterality: N/A;   LEFT HEART CATH AND CORONARY ANGIOGRAPHY N/A 01/12/2022   Procedure: LEFT HEART CATH AND CORONARY ANGIOGRAPHY;  Surgeon: Early Osmond, MD;  Location: [facility];  Service: Cardiovascular;  Laterality: N/A;   MOHS SURGERY  6/07   L face, Dr. Link Snuffer   POLYPECTOMY     TONSILLECTOMY     TOTAL HIP ARTHROPLASTY Left 04/18/2021   Procedure: LEFT TOTAL HIP ARTHROPLASTY ANTERIOR APPROACH;  Surgeon: Melrose Nakayama, MD;  Location: [facility];  Service: Orthopedics;  Laterality: Left;   VASECTOMY      Current Medications: Current Meds  Medication Sig   acetaminophen (TYLENOL) 500 MG tablet Take 500 mg by mouth every 6 (six) hours as needed.   acetaminophen (TYLENOL) 650 MG CR tablet Take 650 mg by mouth every 8 (eight) hours as needed for pain.   amLODipine (NORVASC) 5 MG tablet Take 1 tablet (5 mg total) by mouth daily at noon.   aspirin EC 81 MG tablet Take 1 tablet (81 mg total) by mouth in the morning. Swallow whole.   Cholecalciferol (VITAMIN D3) 50 MCG (2000 UT) TABS  Take 1 tablet by mouth daily.   ezetimibe (ZETIA) 10 MG tablet Take 1 tablet (10 mg total) by mouth daily.   finasteride (PROSCAR) 5 MG tablet Take 5 mg by mouth at bedtime.   hydrOXYzine (ATARAX) 10 MG tablet Take 1 tablet (10 mg total) by mouth 3 (three) times daily as needed.   isosorbide mononitrate (IMDUR) 30 MG 24 hr tablet Take 1 tablet (30 mg total) by mouth every morning. Take 1/2 tablet (15 mg total) every night.   Lidocaine-Glycerin (PREPARATION H EX) Place 1 application  rectally daily as needed (irritation).   losartan (COZAAR) 50 MG tablet Take 1 tablet (50 mg total) by mouth in the morning.   metoprolol succinate (TOPROL-XL) 25 MG 24 hr tablet Take 1 tablet (25 mg total) by mouth at bedtime.   primidone (MYSOLINE) 50 MG tablet TAKE ONE  TABLET AT BEDTIME   Propylene Glycol (SYSTANE COMPLETE) 0.6 % SOLN Place 1 drop into both eyes daily as needed (dry eyes).   rosuvastatin (CRESTOR) 20 MG tablet TAKE ONE TABLET BY MOUTH ONCE DAILY   triamcinolone cream (KENALOG) 0.1 % Apply 1 Application topically 2 (two) times daily.    Allergies:   Codeine   Social History   Socioeconomic History   Marital status: Married    Spouse name: Not on file   Number of children: 2   Years of education: 16   Highest education level: Bachelor's degree (e.g., BA, AB, BS)  Occupational History   Occupation: retired  Tobacco Use   Smoking status: Former    Types: Cigars   Smokeless tobacco: Never   Tobacco comments:    as a teenager  Vaping Use   Vaping Use: Never used  Substance and Sexual Activity   Alcohol use: Yes    Alcohol/week: 0.0 standard drinks of alcohol    Comment: 5-6 ounces of Hoard wine or beer 6-7 x/week    Drug use: No   Sexual activity: Not Currently  Other Topics Concern   Not on file  Social History Narrative   Widowed 2013 (after wife's long battle with MS).  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  Children live in [place], [place] and [place].   Married March 2016 (Sudie)   Right handed   Drinks caffeine   Two story home      He has 4 grandkids, Sudie has 9      Updated 04/2022   Social Determinants of Health   Financial Resource Strain: Not on file  Food Insecurity: Not on file  Transportation Needs: Not on file  Physical Activity: Not on file  Stress: Not on file  Social Connections: Not on file     Family History:  The patient's family history includes Cancer (age of onset: 78) in his mother; Colon cancer in his mother; Colon polyps in his mother; Diabetes in his mother; Heart disease in his brother, father, and son; Hyperlipidemia in his  brother, brother, brother, son, and son; Hypertension in his father; Sleep apnea in his son; Syncope episode in his son; Tremor in his brother and brother.   ROS:   Please see the history of present illness.    ROS All other systems reviewed and are negative.      No data to display  PHYSICAL EXAM:   VS:  BP 116/60   Pulse (!) 51   Ht 6' 0.05" (1.83 m)   Wt 175 lb 12.8 oz (79.7 kg)   SpO2 98%   BMI 23.81 kg/m     GEN: Well nourished, well developed in no acute distress HEENT: Normal NECK: No JVD; No carotid bruits LYMPHATICS: No lymphadenopathy CARDIAC:RRR, no murmurs, rubs, gallops RESPIRATORY:  Clear to auscultation without rales, wheezing or rhonchi  ABDOMEN: Soft, non-tender, non-distended MUSCULOSKELETAL:  No edema; No deformity  SKIN: Warm and dry NEUROLOGIC:  Alert and oriented x 3 PSYCHIATRIC:  Normal affect     Wt Readings from Last 3 Encounters:  06/05/22 175 lb 12.8 oz (79.7 kg)  05/28/22 180 lb 12.8 oz (82 kg)  05/03/22 181 lb 6.4 oz (82.3 kg)      Studies/Labs Reviewed:   EKG:  EKG is not ordered today   Recent Labs: 01/09/2022: BUN 18; Creatinine, Ser 1.19; Hemoglobin 14.5; Platelets 218; Potassium 4.7; Sodium 138 04/02/2022: ALT 34   Lipid Panel    Component Value Date/Time   CHOL 100 04/02/2022 1134   TRIG 105 04/02/2022 1134   HDL 43 04/02/2022 1134   CHOLHDL 2.3 04/02/2022 1134   CHOLHDL 2.9 10/30/2017 0944   VLDL 17 08/20/2016 1051   LDLCALC 37 04/02/2022 1134   LDLCALC 80 10/30/2017 0944    Additional studies/ records that were reviewed today include:  2D echo 03/2020 IMPRESSIONS     1. Left ventricular ejection fraction, by estimation, is 60 to 65%. The  left ventricle has normal function. The left ventricle has no regional  wall motion abnormalities. Left ventricular diastolic parameters are  indeterminate.   2. Right ventricular systolic function is normal. The right ventricular  size is normal. There is normal  pulmonary artery systolic pressure. The  estimated right ventricular systolic pressure is 91.4 mmHg.   3. The mitral valve is grossly normal. Mild mitral valve regurgitation.  No evidence of mitral stenosis.   4. The aortic valve is tricuspid. Aortic valve regurgitation is not  visualized. Mild aortic valve sclerosis is present, with no evidence of  aortic valve stenosis.   5. The inferior vena cava is normal in size with greater than 50%  respiratory variability, suggesting right atrial pressure of 3 mmHg.   Cardiac Cath 03/2020 Study Highlights    Nuclear stress EF: 56%. There was no ST segment deviation noted during stress. No T wave inversion was noted during stress. The study is normal. This is a low risk study. The left ventricular ejection fraction is normal (55-65%).   1. There are reduced counts in the mid inferior segment on rest and stress imaging consistent with diaphragm attenuation.  2. This is a normal study without evidence of ischemia or infarction.  3. Normal LVEF, 56%.  4. This is a low-risk study.     ASSESSMENT:    1. Coronary artery disease due to lipid rich plaque   2. Primary hypertension   3. Pure hypercholesterolemia      PLAN:  In order of problems listed above:  1.  ASCAD with chronic stable angina -nuclear stress test showed no ischemia in 03/2020 -2D echo showed normal LVF in June 2021 -coronary CTA showed 70-99% oRCA, 50-69% RCA crux, 50%dLM, 70-99% oLAD, 50-69% OM1 -cardiac cath 01/12/2022 which showed 99% oRCA, 85% mRCA and 60% pLAD with L>R collaterals.  Medical management was recommended.  -he has not had any anginal sx since I saw him last -  Continue prescription drug management with amlodipine '5mg'$  daily, Toprol XL '25mg'$  daily, Imdur'30mg'$  qam and '15mg'$  qhs and high dose statin with PRN refills  2.  HTN -BP controlled on exam today -Continue prescription drug management with amlodipine '5mg'$  daily, Losartan '50mg'$  daily with PRN  refills -decrease Toprol XL to 12.'5mg'$  daily due to bradycardia -repeat EKG in 1 week -I have personally reviewed and interpreted outside labs performed by patient's PCP which showed SCr 1.19, K+ 4.7 on 01/09/2022   3.  HLD -LDL goal < 50 -I have personally reviewed and interpreted outside labs performed by patient's PCP which showed LDL 37, HDL 43 and TAGs 105 on 04/02/22 -continue prescription drug management with Crestor '20mg'$  daily and Zetia '10mg'$  daily with PRN refills  Medication Adjustments/Labs and Tests Ordered: Current medicines are reviewed at length with the patient today.  Concerns regarding medicines are outlined above.  Medication changes, Labs and Tests ordered today are listed in the Patient Instructions below.  There are no Patient Instructions on file for this visit.   Signed, [name], MD  06/05/2022 9:07 AM    [address], [place], [place]  [address] Phone: [phone]; Fax: [phone]

## 2022-06-05 ENCOUNTER — Encounter: Payer: Self-pay | Admitting: Cardiology

## 2022-06-05 ENCOUNTER — Ambulatory Visit: Payer: PPO | Admitting: Cardiology

## 2022-06-05 VITALS — BP 116/60 | HR 51 | Ht 72.05 in | Wt 175.8 lb

## 2022-06-05 DIAGNOSIS — I2583 Coronary atherosclerosis due to lipid rich plaque: Secondary | ICD-10-CM

## 2022-06-05 DIAGNOSIS — I10 Essential (primary) hypertension: Secondary | ICD-10-CM | POA: Diagnosis not present

## 2022-06-05 DIAGNOSIS — E7800 Pure hypercholesterolemia, unspecified: Secondary | ICD-10-CM | POA: Diagnosis not present

## 2022-06-05 DIAGNOSIS — I2510 Atherosclerotic heart disease of native coronary artery without angina pectoris: Secondary | ICD-10-CM | POA: Diagnosis not present

## 2022-06-05 MED ORDER — METOPROLOL SUCCINATE ER 25 MG PO TB24: 12.5000 mg | ORAL_TABLET | Freq: Every day | ORAL | 3 refills | Status: DC

## 2022-06-05 NOTE — Addendum Note (Signed)
Addended by: Antonieta Iba on: 06/05/2022 09:12 AM   Modules accepted: Orders

## 2022-06-05 NOTE — Patient Instructions (Signed)
Medication Instructions:  Your physician has recommended you make the following change in your medication:  1) DECREASE metoprolol to 12.5 mg daily   *If you need a refill on your cardiac medications before your next appointment, please call your pharmacy*  Follow-Up: At [facility], you and your health needs are our priority.  As part of our continuing mission to provide you with exceptional heart care, we have created designated Provider Care Teams.  These Care Teams include your primary Cardiologist (physician) and Advanced Practice Providers (APPs -  Physician Assistants and Nurse Practitioners) who all work together to provide you with the care you need, when you need it.  Nurse visit in one week for an EKG  Your next appointment:   6 month(s)  The format for your next appointment:   In Person  Provider:   [name], MD    Important Information About Sugar

## 2022-06-11 ENCOUNTER — Ambulatory Visit: Payer: PPO | Admitting: Internal Medicine

## 2022-06-11 VITALS — BP 110/64 | HR 66 | Wt 175.5 lb

## 2022-06-11 DIAGNOSIS — I2583 Coronary atherosclerosis due to lipid rich plaque: Secondary | ICD-10-CM

## 2022-06-11 DIAGNOSIS — I10 Essential (primary) hypertension: Secondary | ICD-10-CM

## 2022-06-11 DIAGNOSIS — I2510 Atherosclerotic heart disease of native coronary artery without angina pectoris: Secondary | ICD-10-CM

## 2022-06-11 NOTE — Progress Notes (Signed)
   Nurse Visit   Date of Encounter: 06/11/2022 ID: Eric Roux., DOB December 01, 1940, MRN [id number]  PCP:  [name], MD   [facility] Providers Cardiologist:  [name], MD      Visit Details   VS:  BP 110/64 (BP Location: Right Arm)   Pulse 66   Wt 175 lb 8 oz (79.6 kg)   SpO2 95%   BMI 23.77 kg/m  , BMI Body mass index is 23.77 kg/m.  Wt Readings from Last 3 Encounters:  06/11/22 175 lb 8 oz (79.6 kg)  06/05/22 175 lb 12.8 oz (79.7 kg)  05/28/22 180 lb 12.8 oz (82 kg)     Reason for visit: Repeat EKG for DECREASE of Metoprolol Succinate to 12.5 Mg Performed today: Vitals, EKG, Provider consulted:[facility], Dr. Gasper Sells, and Education Changes (medications, testing, etc.) : EKG  Length of Visit: 30 minutes    Medications Adjustments/Labs and Tests Ordered:  Pt Repeat EKG for decrease of Metoprolol Succinate from 25 Mg to 12.5 Mg.  Pt vital signs WNL, pt stated he feels good, asymptomatic after changes to his Toprol-XL.  1st EKG had some artifact present; V1 and V2 lead readjusted for second EKG.  Both EKG's take to DOD, Dr. Gasper Sells for review;  Dr. Gasper Sells signed, stated no changes necessary to medication at this time.  Pt advised to reach out to [facility] on [facility] Triage if he becomes symptomatic;  If any other changes to his POC, we will reach out to him. No follow up required at this time.  See scanned EKG results.     Signed, Eric Daily, RN  06/11/2022 11:39 AM

## 2022-06-20 ENCOUNTER — Other Ambulatory Visit: Payer: Self-pay | Admitting: Neurology

## 2022-06-20 DIAGNOSIS — G250 Essential tremor: Secondary | ICD-10-CM

## 2022-06-21 NOTE — Telephone Encounter (Signed)
Has follow up with sara

## 2022-06-26 IMAGING — CT CT HEART MORP W/ CTA COR W/ SCORE W/ CA W/CM &/OR W/O CM
4 of 7 series · 8 of 20 positions shown, 9 images · IV contrast (APPLIED)
Comparison: 04/20/2021 CTA chest
COMPARISON: 04/20/2021 CTA chest

Addendum:
EXAM:
OVER-READ INTERPRETATION  CT CHEST

The following report is an over-read performed by radiologist Dr.
Patrick Pajon Tifalin [REDACTED] on 01/05/2022. This over-read
does not include interpretation of cardiac or coronary anatomy or
pathology. The coronary CTA interpretation by the cardiologist is
attached.
HISTORY: 81 yo male with chest pain, nonspecific
Cardiac/Coronary CTA
TECHNIQUE: The patient was scanned on a Siemens Force scanner.
PROTOCOL: A 100 kV prospective scan was triggered in the descending thoracic
aorta at 111 HU's. Axial non-contrast 3 mm slices were carried out
through the heart. The data set was analyzed on a dedicated work
station and scored using the Agatson method. Gantry rotation speed
was 250 msecs and collimation was .6 mm. Beta blockade and 0.8 mg of
sl NTG was given. The 3D data set was reconstructed in 5% intervals
of the 35-75 % of the R-R cycle. Diastolic phases were analyzed on a
dedicated work station using MPR, MIP and VRT modes. The patient
received 95mL OMNIPAQUE IOHEXOL 350 MG/ML SOLN of contrast.

[Series 6: ts diast sharp · axial · 0.39mm/px · z∈[+1087,+1124]mm · 2 of 281 slices shown]
[im 94/281  lung]
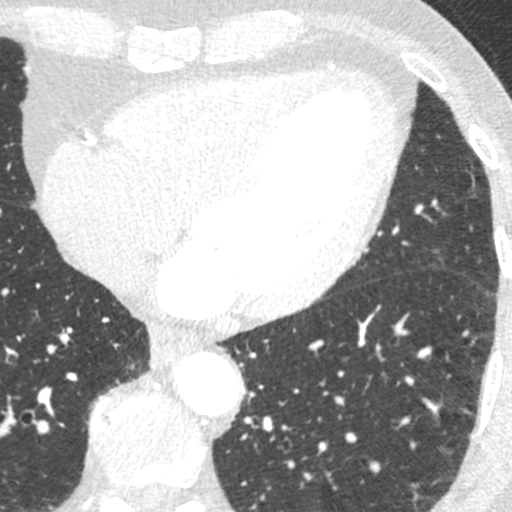
[im 187/281  lung]
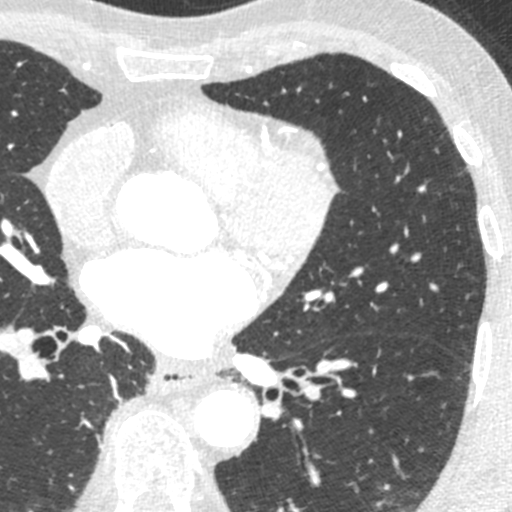

[Series 7: ts syst sharp · axial · 0.39mm/px · z∈[+1087,+1124]mm · 2 of 281 slices shown]
[im 94/281  lung]
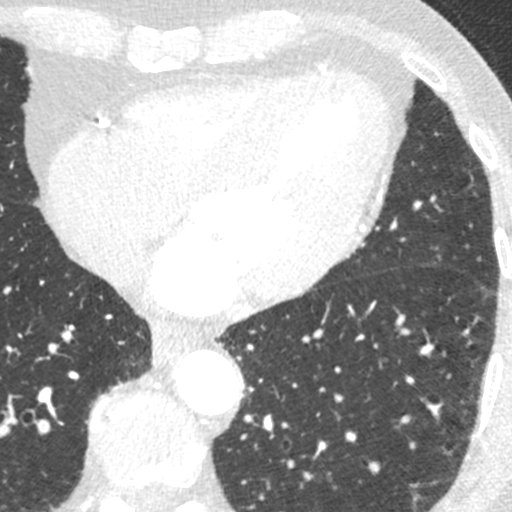
[im 187/281  lung]
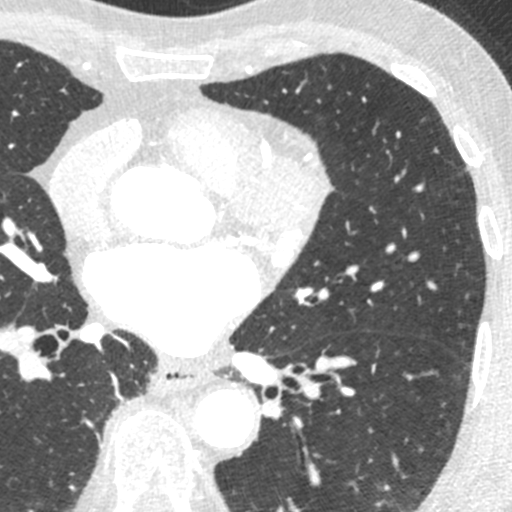

[Series 8: best syst · axial · 0.39mm/px · z∈[+1087,+1124]mm · 2 of 281 slices shown, 3 images]
[im 94/281  vessel]
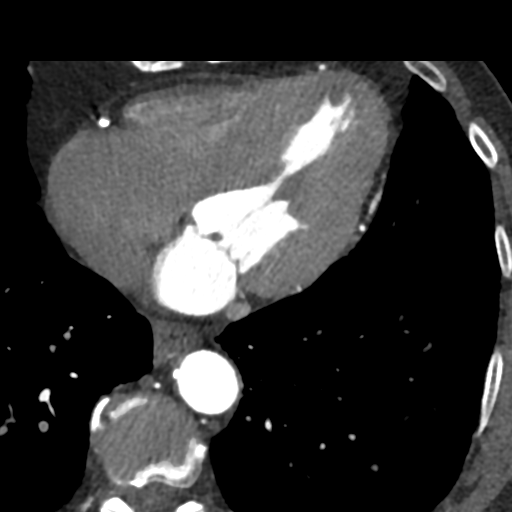
[im 94/281  lung]
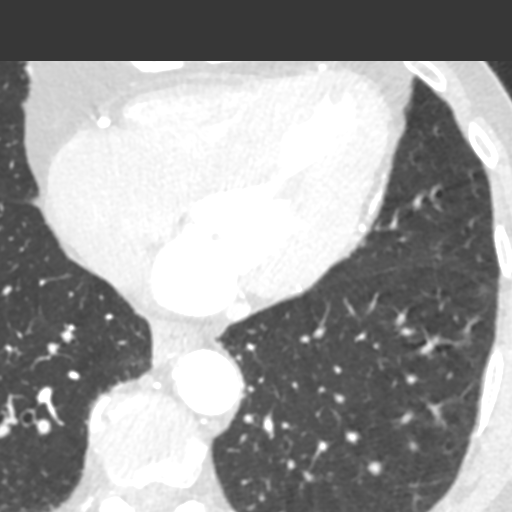
[im 187/281  vessel]
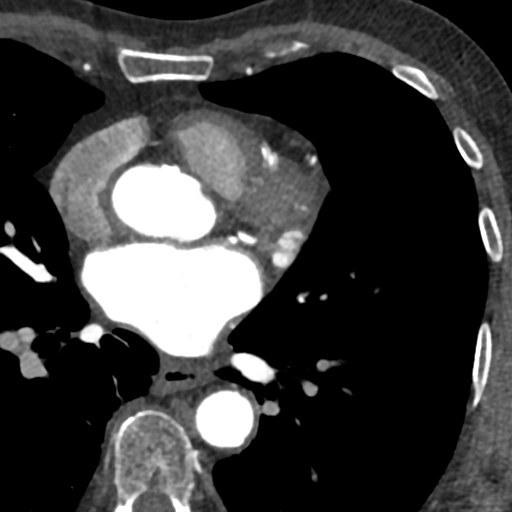

[Series 9: best diast · axial · 0.39mm/px · z∈[+1087,+1124]mm · 2 of 281 slices shown]
[im 94/281  vessel]
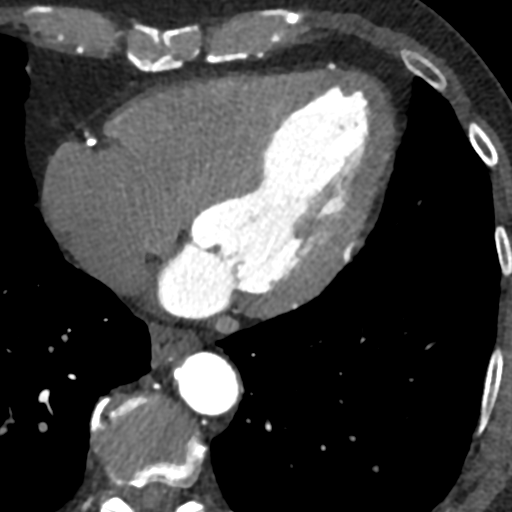
[im 187/281  vessel]
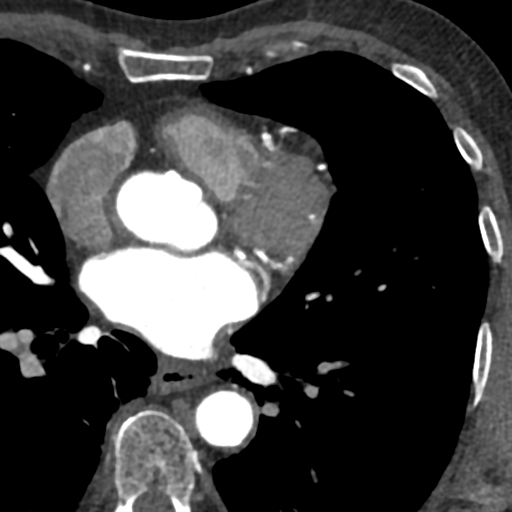

[8 of 20 positions shown; findings below may reference images not displayed]

FINDINGS: Vascular: Aortic atherosclerosis. No central pulmonary embolism, on
this non-dedicated study.

Mediastinum/Nodes: Incompletely imaged prevascular node of 8 mm is
borderline enlarged, but was similar back on the 04/20/2021 CT,
favoring a benign/reactive etiology.

Suspect distal esophageal wall thickening, mild.  Example [DATE].

Lungs/Pleura: No pleural fluid.  Clear imaged lungs.

Upper Abdomen: Normal imaged portions of the liver, spleen.

Musculoskeletal: No acute osseous abnormality.
IMPRESSION: 1.  No acute findings in the imaged extracardiac chest.
2.  Aortic Atherosclerosis ([id number]-[id number].Q).
3. Upper normal to mildly enlarged prevascular node is stable over
nearly 1 year and favored to be reactive.
4. Suspect distal esophageal wall thickening, as can be seen with
esophagitis.
FINDINGS: Quality: Excellent, HR 51

Coronary calcium score: The patient's coronary artery calcium score
is 3276, which places the patient in the 84th percentile.

Coronary arteries: Normal coronary origins.  Right dominance.

Right Coronary Artery: Dominant. Heavily calcified ostium with
severe 70-99% stenosis ([id number]) and moderate to severe mixed
stenosis at the crux (50-69%). There is moderate mixed distal
stenosis as well ([id number])

Left Main Coronary Artery: 50% calcified distal LM stenosis. The LM
bifurcates into the LAD and LCx arteries.

Left Anterior Descending Coronary Artery: Anterior vessel that wraps
around the apex. Heavy calcification of the ostial to proximal
vessel with severe 70-99% ([id number]) stenosis.

Left Circumflex Artery: Small AV groove LCX with minimal 1-24%
proximal stenosis. Larger high OM1 branch with at least moderate
mixed proximal stenosis, 50-69% ([id number]).

Aorta: Normal size, 29 mm at the mid ascending aorta (level of the
PA bifurcation) measured double oblique. Aortic atherosclerosis. No
dissection.

Aortic Valve: Trileaflet.  Aortic valve calcification.

Other findings:

Normal pulmonary vein drainage into the left atrium.

Normal left atrial appendage without a thrombus.

Normal size of the pulmonary artery.
IMPRESSION: 1. Heavy coronary calcium with severe multivessel and left main CAD,
CADRADS = 4a. CT FFR will be performed and reported separately.

2. Coronary calcium score of 3276. This was 84th percentile for age
and sex matched control.

3. Normal coronary origin with right dominance.

4. Aortic valve calcification.  Aortic atherosclerosis.

5. Definitive cardiac catheterization is recommended.

*** End of Addendum ***
EXAM:
OVER-READ INTERPRETATION  CT CHEST

The following report is an over-read performed by radiologist Dr.
Patrick Pajon Tifalin [REDACTED] on 01/05/2022. This over-read
does not include interpretation of cardiac or coronary anatomy or
pathology. The coronary CTA interpretation by the cardiologist is
attached.
FINDINGS: Vascular: Aortic atherosclerosis. No central pulmonary embolism, on
this non-dedicated study.

Mediastinum/Nodes: Incompletely imaged prevascular node of 8 mm is
borderline enlarged, but was similar back on the 04/20/2021 CT,
favoring a benign/reactive etiology.

Suspect distal esophageal wall thickening, mild.  Example [DATE].

Lungs/Pleura: No pleural fluid.  Clear imaged lungs.

Upper Abdomen: Normal imaged portions of the liver, spleen.

Musculoskeletal: No acute osseous abnormality.
IMPRESSION: 1.  No acute findings in the imaged extracardiac chest.
2.  Aortic Atherosclerosis ([id number]).
3. Upper normal to mildly enlarged prevascular node is stable over
nearly 1 year and favored to be reactive.
4. Suspect distal esophageal wall thickening, as can be seen with
esophagitis.

## 2022-07-04 ENCOUNTER — Encounter: Payer: Self-pay | Admitting: Internal Medicine

## 2022-07-24 DIAGNOSIS — L821 Other seborrheic keratosis: Secondary | ICD-10-CM | POA: Diagnosis not present

## 2022-07-24 DIAGNOSIS — L570 Actinic keratosis: Secondary | ICD-10-CM | POA: Diagnosis not present

## 2022-07-24 DIAGNOSIS — Z85828 Personal history of other malignant neoplasm of skin: Secondary | ICD-10-CM | POA: Diagnosis not present

## 2022-07-24 DIAGNOSIS — L812 Freckles: Secondary | ICD-10-CM | POA: Diagnosis not present

## 2022-07-24 DIAGNOSIS — L853 Xerosis cutis: Secondary | ICD-10-CM | POA: Diagnosis not present

## 2022-07-24 DIAGNOSIS — D1801 Hemangioma of skin and subcutaneous tissue: Secondary | ICD-10-CM | POA: Diagnosis not present

## 2022-08-07 ENCOUNTER — Encounter: Payer: Self-pay | Admitting: Internal Medicine

## 2022-08-27 ENCOUNTER — Encounter (HOSPITAL_BASED_OUTPATIENT_CLINIC_OR_DEPARTMENT_OTHER): Payer: Self-pay | Admitting: Cardiology

## 2022-08-28 MED ORDER — ISOSORBIDE MONONITRATE ER 30 MG PO TB24: 30.0000 mg | ORAL_TABLET | Freq: Two times a day (BID) | ORAL | 3 refills | Status: DC

## 2022-10-17 ENCOUNTER — Other Ambulatory Visit: Payer: Self-pay | Admitting: Physician Assistant

## 2022-10-17 DIAGNOSIS — G250 Essential tremor: Secondary | ICD-10-CM

## 2022-11-01 ENCOUNTER — Ambulatory Visit: Payer: PPO | Admitting: Physician Assistant

## 2022-12-14 DIAGNOSIS — N401 Enlarged prostate with lower urinary tract symptoms: Secondary | ICD-10-CM | POA: Diagnosis not present

## 2022-12-14 DIAGNOSIS — R351 Nocturia: Secondary | ICD-10-CM | POA: Diagnosis not present

## 2022-12-14 DIAGNOSIS — N5201 Erectile dysfunction due to arterial insufficiency: Secondary | ICD-10-CM | POA: Diagnosis not present

## 2023-01-03 ENCOUNTER — Ambulatory Visit (INDEPENDENT_AMBULATORY_CARE_PROVIDER_SITE_OTHER): Payer: PPO | Admitting: Physician Assistant

## 2023-01-03 ENCOUNTER — Encounter: Payer: Self-pay | Admitting: Physician Assistant

## 2023-01-03 VITALS — BP 116/67 | HR 71 | Resp 18 | Ht 72.0 in | Wt 182.0 lb

## 2023-01-03 DIAGNOSIS — G3184 Mild cognitive impairment, so stated: Secondary | ICD-10-CM

## 2023-01-03 MED ORDER — MEMANTINE HCL 5 MG PO TABS
5.0000 mg | ORAL_TABLET | Freq: Every evening | ORAL | 11 refills | Status: DC
Start: 2023-01-03 — End: 2023-07-08

## 2023-01-03 NOTE — Progress Notes (Signed)
Assessment/Plan:   Mild Cognitive  Impairment likely due to Alzheimer's Disease   Eric Simmons. is a very pleasant 82 y.o. RH male with a history of hypertension, hyperlipidemia, anxiety, depression, ET with sialorrhea  seen today in follow up for memory loss.  Overall, he is stable from the cognitive standpoint.  Patient is not on antidementia medication.MMSE today is 28/30, improved from prior despite subjective complaints of memory loss. He is able to participate on her ADsLs. .      Follow up in 6  months. Start memantine 5 mg nightly, side effects discussed Monitor driving Continue to control mood as per PCP Continue to monitor cardiovascular risk factors He is scheduled to repeat neuropsychological evaluation for clarity of the diagnosis and disease progression in September 2024  Tremors and Sialorrhea  This is monitored as it is associated with PD.  Not a candidate for Ach due to increased risk of confusion and falls. In the past Botox (type A and B) were discussed with Atropine 1% drops, but he declined during the last visit 1 year ago. Instead he preferred lemon drop therapy, which stimulates oropharynx to induce swallowing.  Tremors are controlled, no other parkinsonian signs are noted.Continue Primidone 50 mg nightly Continue physical activity     Subjective:    This patient is accompanied in the office by his wife who supplements the history.  Previous records as well as any outside records available were reviewed prior to todays visit. Patient was last seen on  01/02/22 at which time his MMSE was 23/30    Any changes in memory since last visit?  "A little worse "-he reports, Patient has some difficulty remembering recent conversations and people names.  He likes to work in his yard, walk miles a day, and [facility].  He does not like to do crossword puzzles or word finding.  He likes to watch TV repeats oneself?  Endorsed Disoriented  when walking into a room?  Patient denies   Leaving objects in unusual places?  Misplaces things, but not in unusual places.  The other day he misplaced his wife's driver's license, thought he was in his wallet but it was saying his jacket.  Wandering behavior?  denies   Any personality changes since last visit?  denies   Any worsening depression?:  denies   Hallucinations or paranoia?  denies   Seizures?    denies    Any sleep changes?  Sleeps  well .Denies vivid dreams, REM behavior or sleepwalking   Sleep apnea?   denies   Any hygiene concerns?    denies   Independent of bathing and dressing?  Endorsed  Does the patient needs help with medications?  Wife is in charge   Who is in charge of the finances?  Patient  is in charge     Any changes in appetite?  denies     Patient have trouble swallowing?  denies   Does the patient cook?  No  Any headaches?   denies   Chronic back pain  denies   Ambulates with difficulty?     denies   Recent falls or head injuries? denies     Unilateral weakness, numbness or tingling?    denies   Any tremors?  denies   Any anosmia?  Patient denies   Any incontinence of urine?  denies   Any bowel dysfunction?     denies  Patient lives  with wife  Does the patient drive?endorsed, no issues   Initial Visit 3/7/23The patient is seen in neurologic consultation at the request of [name], MD for the evaluation of memory.  The patient is accompanied by his wife who supplements the history.  The patient feels that the memory "may be a little bit worse, but not that much ".  His main frustration is remembering people's names, this bothers him.  He still tries to monitor taking his level of cognition by attending groups to discuss a variety of topics, including politics, which is very stimulating for him.  They also discussed basketball during the season.  He may have some issues with reading comprehension, and processing which may be slower than prior.  He likes  to walk about 2 miles a day, 5 days a week, he [facility], and work on the yard.  He denies repeating himself.  He denies being disoriented when walking into her room, or leaving objects in unusual places.  He denies wandering behavior.  He continues to drive, denies feeling lost.  His mood is good, denies depression or irritability.  He sleeps well, denies vivid dreams, sleepwalking, hallucinations or paranoia.  There are no hygiene concerns.  He is independent of bathing and dressing.  His medications are in a pillbox, and the finances and are on autopay.  His appetite is good, denies trouble swallowing, but he does have sialorrhea, at times bothersome, but he does not wet the pillow as of yet.  He does not cook much.  Denies leaving the stove or the faucet on.  He denies any headaches, double vision, dizziness, focal numbness or tingling, unilateral weakness or anosmia.  No history of seizures.  Denies urine incontinence, retention, constipation or diarrhea.  Denies OSA, alcohol or tobacco.  As for his essential tremor, he reports that they are about the same as prior.  He is on primidone 50 mg nightly.  He does not want to change his dosage at this time, and his wife agrees as well.   Current prescribed movement disorder medications: Primidone, 50 mg nightly   Neurocognitive Testing 12/14/21 Dr. Melvyn Novas . Briefly, results suggested impairment surrounding semantic fluency, as well as additional performance variability and/or relative weakness across processing speed and all aspects of learning and memory. Relative to Mr. Ludwig's past evaluations, mild decline was seen in several domains. Most prominent was essentially all aspects of list and shape-based memory tasks, as well as delayed retrieval aspects of a story-based task. He also exhibited greater difficulty across response inhibition, while much milder declines were seen across processing speed and cognitive flexibility. Performances were  generally stable across attention/concentration, response inhibition, expressive language, and visuospatial abilities. Past neuroimaging suggesting disproportionate mesial temporal lobe volume loss is a known risk factor for Alzheimer's disease, as can be the presence of superficial siderosis. Since 2020, there has been steady, documented decline across list and shape-based memory tasks, both in terms of the acquisition of information and its retention after time has passed which would also increase the risk for a memory-based neurodegenerative illness. However, story-based information has remained somewhat resilient to significant decline thus far. While consistent impairment in semantic fluency is also a common finding in Alzheimer's disease, confrontation naming has also remained resilient to decline over the years. While the potential for a quite slowly progressing case of Alzheimer's disease remains plausible, he has yet to fully fall in line with typical patterns of impairment across  testing.   PREVIOUS MEDICATIONS:   CURRENT MEDICATIONS:  Outpatient Encounter Medications as of 01/03/2023  Medication Sig   acetaminophen (TYLENOL) 500 MG tablet Take 500 mg by mouth every 6 (six) hours as needed.   acetaminophen (TYLENOL) 650 MG CR tablet Take 650 mg by mouth every 8 (eight) hours as needed for pain.   amLODipine (NORVASC) 5 MG tablet Take 1 tablet (5 mg total) by mouth daily at noon.   aspirin EC 81 MG tablet Take 1 tablet (81 mg total) by mouth in the morning. Swallow whole.   Cholecalciferol (VITAMIN D3) 50 MCG (2000 UT) TABS Take 1 tablet by mouth daily.   ezetimibe (ZETIA) 10 MG tablet Take 1 tablet (10 mg total) by mouth daily.   finasteride (PROSCAR) 5 MG tablet Take 5 mg by mouth at bedtime.   hydrOXYzine (ATARAX) 10 MG tablet Take 1 tablet (10 mg total) by mouth 3 (three) times daily as needed.   isosorbide mononitrate (IMDUR) 30 MG 24 hr tablet Take 1 tablet (30 mg total) by mouth 2 (two)  times daily. Take 1 tablet (30 mg total) by mouth every morning. Take 1/2 tablet (15 mg total) every night.   Lidocaine-Glycerin (PREPARATION H EX) Place 1 application  rectally daily as needed (irritation).   losartan (COZAAR) 50 MG tablet Take 1 tablet (50 mg total) by mouth in the morning.   memantine (NAMENDA) 5 MG tablet Take 1 tablet (5 mg total) by mouth at bedtime.   metoprolol succinate (TOPROL XL) 25 MG 24 hr tablet Take 0.5 tablets (12.5 mg total) by mouth daily.   primidone (MYSOLINE) 50 MG tablet TAKE ONE TABLET AT BEDTIME   Propylene Glycol (SYSTANE COMPLETE) 0.6 % SOLN Place 1 drop into both eyes daily as needed (dry eyes).   rosuvastatin (CRESTOR) 20 MG tablet TAKE ONE TABLET BY MOUTH ONCE DAILY   triamcinolone cream (KENALOG) 0.1 % Apply 1 Application topically 2 (two) times daily.   nitroGLYCERIN (NITROSTAT) 0.4 MG SL tablet Place 1 tablet (0.4 mg total) under the tongue every 5 (five) minutes as needed for chest pain. (Patient not taking: Reported on 05/03/2022)   No facility-administered encounter medications on file as of 01/03/2023.       01/03/2023   12:00 PM 05/08/2018   10:18 AM  MMSE - Mini Mental State Exam  Orientation to time 3 5  Orientation to time comments  July 11th or 12th  Orientation to Place 5 5  Registration 3 3  Attention/ Calculation 5 5  Recall 3 2  Language- name 2 objects 2 2  Language- repeat 1 1  Language- follow 3 step command 3 3  Language- read & follow direction 1 1  Write a sentence 1 1  Write a sentence-comments  significant tremor  Copy design 1 1  Total score 28 29      01/02/2022   10:00 AM 06/18/2019    1:00 PM 05/20/2018    8:55 AM  Montreal Cognitive Assessment   Visuospatial/ Executive (0/5) '5 5 5  '$ Naming (0/3) '3 3 3  '$ Attention: Read list of digits (0/2) '2 2 2  '$ Attention: Read list of letters (0/1) '1 1 1  '$ Attention: Serial 7 subtraction starting at 100 (0/3) '3 3 2  '$ Language: Repeat phrase (0/2) '2 2 2  '$ Language : Fluency  (0/1) '1 1 1  '$ Abstraction (0/2) '1 1 1  '$ Delayed Recall (0/5) 0 1 0  Orientation (0/6) '5 5 6  '$ Total 23 24 23  Adjusted Score (based on education)  24 23    Objective:     PHYSICAL EXAMINATION:    VITALS:   Vitals:   01/03/23 0838  BP: 116/67  Pulse: 71  Resp: 18  SpO2: 96%  Weight: 182 lb (82.6 kg)  Height: 6' (1.829 m)    GEN:  The patient appears stated age and is in NAD. HEENT:  Normocephalic, atraumatic.   Neurological examination:  General: NAD, well-groomed, appears stated age. Orientation: The patient is alert. Oriented to person, place and not to date Cranial nerves: There is good facial symmetry.The speech is fluent and clear.  No hypophonia.  No aphasia or dysarthria. Fund of knowledge is appropriate. Recent and remote memory are impaired. Attention and concentration are reduced.  Able to name objects and repeat phrases.  Hearing is decreased to conversational tone.  Sensation: Sensation is intact to light touch throughout Motor: Strength is at least antigravity x4. DTR's 2/4 in UE/LE     Movement examination: Tone: There is mildly increased tone on his hands, normal tone in his lower extremities, no cogwheeling is noted. Abnormal movements: Minimal tremor right greater than left similar to prior exam.  No myoclonus.  No asterixis.   Coordination:  There is no decremation with RAM's. Normal finger to nose  Gait and Station: The patient has no difficulty arising out of a deep-seated chair without the use of the hands. The patient's stride length is good.  Gait is cautious and narrow.    Thank you for allowing [place] the opportunity to participate in the care of this nice patient. Please do not hesitate to contact us for any questions or concerns.   Total time spent on today's visit was 35 minutes dedicated to this patient today, preparing to see patient, examining the patient, ordering tests and/or medications and counseling the patient, documenting clinical  information in the EHR or other health record, independently interpreting results and communicating results to the patient/family, discussing treatment and goals, answering patient's questions and coordinating care.  Cc:  [name], MD  Sharene Butters 01/03/2023 12:22 PM

## 2023-01-03 NOTE — Patient Instructions (Addendum)
It was a pleasure to see you today at our office.   Recommendations:  Follow up in 1 year or sooner if necessary  Start Memantine  5 mg at night . Side effects were discussed   Continue Primidone 50 mg nightly   We discusssed that candy like lemon drops can help by stimulating mm of the oropharynx to induce swallowing.   RECOMMENDATIONS FOR ALL PATIENTS WITH MEMORY PROBLEMS: 1. Continue to exercise (Recommend 30 minutes of walking everyday, or 3 hours every week) 2. Increase social interactions - continue going to [place] and enjoy social gatherings with friends and family 3. Eat healthy, avoid fried foods and eat more fruits and vegetables 4. Maintain adequate blood pressure, blood sugar, and blood cholesterol level. Reducing the risk of stroke and cardiovascular disease also helps promoting better memory. 5. Avoid stressful situations. Live a simple life and avoid aggravations. Organize your time and prepare for the next day in anticipation. 6. Sleep well, avoid any interruptions of sleep and avoid any distractions in the bedroom that may interfere with adequate sleep quality 7. Avoid sugar, avoid sweets as there is a strong link between excessive sugar intake, diabetes, and cognitive impairment We discussed the Mediterranean diet, which has been shown to help patients reduce the risk of progressive memory disorders and reduces cardiovascular risk. This includes eating fish, eat fruits and green leafy vegetables, nuts like almonds and hazelnuts, walnuts, and also use olive oil. Avoid fast foods and fried foods as much as possible. Avoid sweets and sugar as sugar use has been linked to worsening of memory function.  There is always a concern of gradual progression of memory problems. If this is the case, then we may need to adjust level of care according to patient needs. Support, both to the patient and caregiver, should then be put into place.    FALL PRECAUTIONS: Be cautious when walking.  Scan the area for obstacles that may increase the risk of trips and falls. When getting up in the mornings, sit up at the edge of the bed for a few minutes before getting out of bed. Consider elevating the bed at the head end to avoid drop of blood pressure when getting up. Walk always in a well-lit room (use night lights in the walls). Avoid area rugs or power cords from appliances in the middle of the walkways. Use a walker or a cane if necessary and consider physical therapy for balance exercise. Get your eyesight checked regularly.  FINANCIAL OVERSIGHT: Supervision, especially oversight when making financial decisions or transactions is also recommended.  HOME SAFETY: Consider the safety of the kitchen when operating appliances like stoves, microwave oven, and blender. Consider having supervision and share cooking responsibilities until no longer able to participate in those. Accidents with firearms and other hazards in the house should be identified and addressed as well.   ABILITY TO BE LEFT ALONE: If patient is unable to contact 911 operator, consider using LifeLine, or when the need is there, arrange for someone to stay with patients. Smoking is a fire hazard, consider supervision or cessation. Risk of wandering should be assessed by caregiver and if detected at any point, supervision and safe proof recommendations should be instituted.  MEDICATION SUPERVISION: Inability to self-administer medication needs to be constantly addressed. Implement a mechanism to ensure safe administration of the medications.   DRIVING: Regarding driving, in patients with progressive memory problems, driving will be impaired. We advise to have someone else do the driving if trouble  finding directions or if minor accidents are reported. Independent driving assessment is available to determine safety of driving.   If you are interested in the driving assessment, you can contact the following:  [employer]  in [place]  [place] [phone]  [place]  [facility] [phone] or [phone]

## 2023-01-08 DIAGNOSIS — D3132 Benign neoplasm of left choroid: Secondary | ICD-10-CM | POA: Diagnosis not present

## 2023-01-08 DIAGNOSIS — H04123 Dry eye syndrome of bilateral lacrimal glands: Secondary | ICD-10-CM | POA: Diagnosis not present

## 2023-01-08 DIAGNOSIS — H5203 Hypermetropia, bilateral: Secondary | ICD-10-CM | POA: Diagnosis not present

## 2023-01-08 DIAGNOSIS — H2513 Age-related nuclear cataract, bilateral: Secondary | ICD-10-CM | POA: Diagnosis not present

## 2023-02-04 DIAGNOSIS — Z85828 Personal history of other malignant neoplasm of skin: Secondary | ICD-10-CM | POA: Diagnosis not present

## 2023-02-04 DIAGNOSIS — L570 Actinic keratosis: Secondary | ICD-10-CM | POA: Diagnosis not present

## 2023-02-04 DIAGNOSIS — C44619 Basal cell carcinoma of skin of left upper limb, including shoulder: Secondary | ICD-10-CM | POA: Diagnosis not present

## 2023-02-04 DIAGNOSIS — L821 Other seborrheic keratosis: Secondary | ICD-10-CM | POA: Diagnosis not present

## 2023-02-04 DIAGNOSIS — D485 Neoplasm of uncertain behavior of skin: Secondary | ICD-10-CM | POA: Diagnosis not present

## 2023-02-04 DIAGNOSIS — L812 Freckles: Secondary | ICD-10-CM | POA: Diagnosis not present

## 2023-02-04 DIAGNOSIS — D1801 Hemangioma of skin and subcutaneous tissue: Secondary | ICD-10-CM | POA: Diagnosis not present

## 2023-02-04 DIAGNOSIS — L853 Xerosis cutis: Secondary | ICD-10-CM | POA: Diagnosis not present

## 2023-02-06 ENCOUNTER — Other Ambulatory Visit: Payer: Self-pay | Admitting: Physician Assistant

## 2023-02-06 DIAGNOSIS — G250 Essential tremor: Secondary | ICD-10-CM

## 2023-02-20 ENCOUNTER — Other Ambulatory Visit: Payer: Self-pay | Admitting: Cardiology

## 2023-03-03 NOTE — Progress Notes (Unsigned)
Cardiology Office Note    Date:  03/04/2023   ID:  Eric Simmons., DOB 30-Mar-1941, MRN [id number]  PCP:  Joselyn Arrow, MD  Cardiologist:  Armanda Magic, MD   Chief Complaint  Patient presents with   Coronary Artery Disease   Hypertension   Hyperlipidemia    History of Present Illness:  Eric Simmons. is a 82 y.o. male with a hx of HLD,  DOE and chest tightness working in the yard and walking. His father died at 47 of a massive MI. He underwent nuclear stress test 03/2020 showing no ischemia and normal LVF.  2D echo showed normal LVF and mildly leaky MV.   2D echo done 04/17/2020 showed normal LV function with mild MR.  He underwent cardiac cath 01/12/2022 which showed 99% oRCA, 85% mRCA and 60% pLAD with L>R collaterals.  Medical management was recommended.    He is here today for followup and is doing well.  He had 1 episode of mild discomfort 1 time when the elevator was broke at the [facility] and he had to take a long set of stairs and it was associated with SOB but that is the only episode he has had.  He walks 2 miles on the track at the [facility] at least 4 days weekly with no problems. Otherwise he denies any chest pain or pressure, SOB, DOE, PND, orthopnea, LE edema, dizziness, palpitations or syncope. He is compliant with his meds and is tolerating meds with no SE.    Past Medical History:  Diagnosis Date   Adenomatous colon polyp 03/2005   tubular adenoma   Aortic atherosclerosis (HCC)    BCC (basal cell carcinoma), face    left face, R ear (04/2014), nose 05/2016   Benign familial tremor 08/29/2012   Coronary artery disease due to lipid rich plaque    cardiac cath 01/12/2022 which showed 99% oRCA, 85% mRCA and 60% pLAD with L>R collaterals.  Medical management was recommended.   Erectile dysfunction    Hemorrhoids    internal and external   Hip arthritis    left   IFG (impaired fasting glucose) 12/10/2012   Glucose 111 11/2012    Insomnia    Mild mitral regurgitation    Mild  neurocognitive disorder 09/11/2019   Pseudogout of knee 07/2003   left   Pulmonary nodule    Pure hypercholesterolemia    Syncope     Past Surgical History:  Procedure Laterality Date   COLONOSCOPY  6/06, 05/2010   Dr. Leone Payor   CORONARY PRESSURE/FFR STUDY N/A 01/12/2022   Procedure: INTRAVASCULAR PRESSURE WIRE/FFR STUDY;  Surgeon: Orbie Pyo, MD;  Location: [facility];  Service: Cardiovascular;  Laterality: N/A;   LEFT HEART CATH AND CORONARY ANGIOGRAPHY N/A 01/12/2022   Procedure: LEFT HEART CATH AND CORONARY ANGIOGRAPHY;  Surgeon: Orbie Pyo, MD;  Location: [facility];  Service: Cardiovascular;  Laterality: N/A;   MOHS SURGERY  6/07   L face, Dr. Park Liter   POLYPECTOMY     TONSILLECTOMY     TOTAL HIP ARTHROPLASTY Left 04/18/2021   Procedure: LEFT TOTAL HIP ARTHROPLASTY ANTERIOR APPROACH;  Surgeon: Marcene Corning, MD;  Location: [facility];  Service: Orthopedics;  Laterality: Left;   VASECTOMY      Current Medications: Current Meds  Medication Sig   acetaminophen (TYLENOL) 500 MG tablet Take 500 mg by mouth every 6 (six) hours as needed.   acetaminophen (TYLENOL) 650 MG CR tablet Take 650  mg by mouth every 8 (eight) hours as needed for pain.   amLODipine (NORVASC) 5 MG tablet Take 1 tablet (5 mg total) by mouth daily at noon.   aspirin EC 81 MG tablet Take 1 tablet (81 mg total) by mouth in the morning. Swallow whole.   Cholecalciferol (VITAMIN D3) 50 MCG (2000 UT) TABS Take 1 tablet by mouth daily.   ezetimibe (ZETIA) 10 MG tablet Take 1 tablet (10 mg total) by mouth daily.   isosorbide mononitrate (IMDUR) 30 MG 24 hr tablet Take 1 tablet (30 mg total) by mouth 2 (two) times daily. Take 1 tablet (30 mg total) by mouth every morning. Take 1/2 tablet (15 mg total) every night.   Lidocaine-Glycerin (PREPARATION H EX) Place 1 application  rectally daily as needed (irritation).   losartan (COZAAR) 50 MG tablet Take 1 tablet (50 mg total) by mouth in the  morning.   metoprolol succinate (TOPROL XL) 25 MG 24 hr tablet Take 0.5 tablets (12.5 mg total) by mouth daily.   nitroGLYCERIN (NITROSTAT) 0.4 MG SL tablet Place 1 tablet (0.4 mg total) under the tongue every 5 (five) minutes as needed for chest pain.   primidone (MYSOLINE) 50 MG tablet TAKE ONE TABLET AT BEDTIME   Propylene Glycol (SYSTANE COMPLETE) 0.6 % SOLN Place 1 drop into both eyes daily as needed (dry eyes).   rosuvastatin (CRESTOR) 20 MG tablet TAKE ONE TABLET BY MOUTH ONCE DAILY    Allergies:   Codeine   Social History   Socioeconomic History   Marital status: Married    Spouse name: Not on file   Number of children: 2   Years of education: 16   Highest education level: Bachelor's degree (e.g., BA, AB, BS)  Occupational History   Occupation: retired  Tobacco Use   Smoking status: Former    Types: Cigars   Smokeless tobacco: Never   Tobacco comments:    as a teenager  Vaping Use   Vaping Use: Never used  Substance and Sexual Activity   Alcohol use: Yes    Alcohol/week: 0.0 standard drinks of alcohol    Comment: 5-6 ounces of Hirschi wine or beer 6-7 x/week    Drug use: No   Sexual activity: Not Currently  Other Topics Concern   Not on file  Social History Narrative   Widowed 2013 (after wife's long battle with MS).  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  Children live in [place], [place] and [place].   Married March 2016 (Sudie)   Right handed   Drinks caffeine   Two story home      He has 4 grandkids, Sudie has 9      Updated 04/2022   Social Determinants of Health   Financial Resource Strain: Not on file  Food Insecurity: Not on file  Transportation Needs: Not on file  Physical Activity: Not on file  Stress: Not on file  Social Connections: Not on file     Family History:  The patient's  family history includes Cancer (age of onset: 12) in his mother; Colon cancer in his mother; Colon polyps in his mother; Diabetes in his mother; Heart disease in his brother, father, and son; Hyperlipidemia in his brother, brother, brother, son, and son; Hypertension in his father; Sleep apnea in his son; Syncope episode in his son; Tremor  in his brother and brother.   ROS:   Please see the history of present illness.    ROS All other systems reviewed and are negative.      No data to display             PHYSICAL EXAM:   VS:  BP 113/62   Pulse 61   Ht 6' (1.829 m)   Wt 176 lb 3.2 oz (79.9 kg)   SpO2 97%   BMI 23.90 kg/m    GEN: Well nourished, well developed in no acute distress HEENT: Normal NECK: No JVD; No carotid bruits LYMPHATICS: No lymphadenopathy CARDIAC:RRR, no murmurs, rubs, gallops RESPIRATORY:  Clear to auscultation without rales, wheezing or rhonchi  ABDOMEN: Soft, non-tender, non-distended MUSCULOSKELETAL:  No edema; No deformity  SKIN: Warm and dry NEUROLOGIC:  Alert and oriented x 3 PSYCHIATRIC:  Normal affect   Wt Readings from Last 3 Encounters:  03/04/23 176 lb 3.2 oz (79.9 kg)  01/03/23 182 lb (82.6 kg)  06/11/22 175 lb 8 oz (79.6 kg)      Studies/Labs Reviewed:   EKG:  EKG is not ordered today   Recent Labs: 04/02/2022: ALT 34   Lipid Panel    Component Value Date/Time   CHOL 100 04/02/2022 1134   TRIG 105 04/02/2022 1134   HDL 43 04/02/2022 1134   CHOLHDL 2.3 04/02/2022 1134   CHOLHDL 2.9 10/30/2017 0944   VLDL 17 08/20/2016 1051   LDLCALC 37 04/02/2022 1134   LDLCALC 80 10/30/2017 0944    Additional studies/ records that were reviewed today include:  2D echo 03/2020 IMPRESSIONS     1. Left ventricular ejection fraction, by estimation, is 60 to 65%. The  left ventricle has normal function. The left ventricle has no regional  wall motion abnormalities. Left ventricular diastolic parameters are  indeterminate.   2. Right  ventricular systolic function is normal. The right ventricular  size is normal. There is normal pulmonary artery systolic pressure. The  estimated right ventricular systolic pressure is 16.5 mmHg.   3. The mitral valve is grossly normal. Mild mitral valve regurgitation.  No evidence of mitral stenosis.   4. The aortic valve is tricuspid. Aortic valve regurgitation is not  visualized. Mild aortic valve sclerosis is present, with no evidence of  aortic valve stenosis.   5. The inferior vena cava is normal in size with greater than 50%  respiratory variability, suggesting right atrial pressure of 3 mmHg.   Cardiac Cath 03/2020 Study Highlights    Nuclear stress EF: 56%. There was no ST segment deviation noted during stress. No T wave inversion was noted during stress. The study is normal. This is a low risk study. The left ventricular ejection fraction is normal (55-65%).   1. There are reduced counts in the mid inferior segment on rest and stress imaging consistent with diaphragm attenuation.  2. This is a normal study without evidence of ischemia or infarction.  3. Normal LVEF, 56%.  4. This is a low-risk study.     ASSESSMENT:    1. Coronary artery disease due to lipid rich plaque   2. Primary hypertension   3. Pure hypercholesterolemia      PLAN:  In order of problems listed above:  1.  ASCAD with chronic stable angina -nuclear stress test showed no ischemia in 03/2020 -2D echo showed normal LVF in June 2021 -coronary CTA showed 70-99% oRCA, 50-69% RCA crux, 50%dLM, 70-99% oLAD, 50-69% OM1 -cardiac cath 01/12/2022 which showed  99% oRCA, 85% mRCA and 60% pLAD with L>R collaterals.  Medical management was recommended.  -He denies any anginal chest pain or shortness of breath since I saw him last -Continue prescription drug management with aspirin 81 mg daily, Imdur 30 mg t every morning and 15 mg every afternoon, Toprol-XL 12.5 mg daily and Crestor 20 mg daily with as needed  refills  2.  HTN -BP is adequately controlled on exam -Continue prescription drug management with amlodipine 5 mg daily, Imdur 30 mg every morning and 15 mg every afternoon, losartan 50 mg daily, Toprol-XL 12.5 mg daily with as needed refills -I have personally reviewed and interpreted outside labs performed by patient's PCP which showed serum creatinine 1.19 and potassium 4.7 on 01/09/2022  3.  HLD -LDL goal < 50 -I have personally reviewed and interpreted outside labs performed by patient's PCP which showed LDL 37 HDL 43 on 04/02/2022 -Continue prescription drug management with Crestor 20 mg daily and Zetia 10 mg daily with as needed refills -Check Cmet and FLP  Medication Adjustments/Labs and Tests Ordered: Current medicines are reviewed at length with the patient today.  Concerns regarding medicines are outlined above.  Medication changes, Labs and Tests ordered today are listed in the Patient Instructions below.  There are no Patient Instructions on file for this visit.   Signed, Armanda Magic, MD  03/04/2023 8:27 AM    [facility] [address] [place], [place], [place]  [address] Phone: [phone]; Fax: [phone]

## 2023-03-04 ENCOUNTER — Ambulatory Visit: Payer: PPO | Attending: Cardiology | Admitting: Cardiology

## 2023-03-04 ENCOUNTER — Encounter: Payer: Self-pay | Admitting: Cardiology

## 2023-03-04 ENCOUNTER — Encounter: Payer: Self-pay | Admitting: Family Medicine

## 2023-03-04 VITALS — BP 113/62 | HR 61 | Ht 72.0 in | Wt 176.2 lb

## 2023-03-04 DIAGNOSIS — I10 Essential (primary) hypertension: Secondary | ICD-10-CM

## 2023-03-04 DIAGNOSIS — Z79899 Other long term (current) drug therapy: Secondary | ICD-10-CM

## 2023-03-04 DIAGNOSIS — I2583 Coronary atherosclerosis due to lipid rich plaque: Secondary | ICD-10-CM

## 2023-03-04 DIAGNOSIS — I2510 Atherosclerotic heart disease of native coronary artery without angina pectoris: Secondary | ICD-10-CM | POA: Diagnosis not present

## 2023-03-04 DIAGNOSIS — E7800 Pure hypercholesterolemia, unspecified: Secondary | ICD-10-CM

## 2023-03-04 DIAGNOSIS — I25119 Atherosclerotic heart disease of native coronary artery with unspecified angina pectoris: Secondary | ICD-10-CM | POA: Insufficient documentation

## 2023-03-04 NOTE — Addendum Note (Signed)
Addended by: Luellen Pucker on: 03/04/2023 08:44 AM   Modules accepted: Orders

## 2023-03-04 NOTE — Patient Instructions (Signed)
Medication Instructions:  Your physician recommends that you continue on your current medications as directed. Please refer to the Current Medication list given to you today.  *If you need a refill on your cardiac medications before your next appointment, please call your pharmacy*   Lab Work: Please schedule a future appointment to complete a FASTING lipid panel and a CMET in our lab.   If you have labs (blood work) drawn today and your tests are completely normal, you will receive your results only by: MyChart Message (if you have MyChart) OR A paper copy in the mail If you have any lab test that is abnormal or we need to change your treatment, we will call you to review the results.   Testing/Procedures: None.   Follow-Up: At [facility], you and your health needs are our priority.  As part of our continuing mission to provide you with exceptional heart care, we have created designated Provider Care Teams.  These Care Teams include your primary Cardiologist (physician) and Advanced Practice Providers (APPs -  Physician Assistants and Nurse Practitioners) who all work together to provide you with the care you need, when you need it.   Your next appointment:   6 month(s)  Provider:   Armanda Magic, MD

## 2023-03-05 ENCOUNTER — Ambulatory Visit: Payer: PPO | Attending: Cardiology

## 2023-03-05 DIAGNOSIS — E7800 Pure hypercholesterolemia, unspecified: Secondary | ICD-10-CM | POA: Diagnosis not present

## 2023-03-05 DIAGNOSIS — Z79899 Other long term (current) drug therapy: Secondary | ICD-10-CM | POA: Diagnosis not present

## 2023-03-05 LAB — LIPID PANEL
Chol/HDL Ratio: 2.4 ratio (ref 0.0–5.0)
Cholesterol, Total: 113 mg/dL (ref 100–199)
HDL: 47 mg/dL (ref >39)
LDL Chol Calc (NIH): 53 mg/dL (ref 0–99)
Triglycerides: 61 mg/dL (ref 0–149)
VLDL Cholesterol Cal: 13 mg/dL (ref 5–40)

## 2023-03-05 LAB — COMPREHENSIVE METABOLIC PANEL
ALT: 60 IU/L — ABNORMAL HIGH (ref 0–44)
AST: 49 IU/L — ABNORMAL HIGH (ref 0–40)
Albumin/Globulin Ratio: 2.4 — ABNORMAL HIGH (ref 1.2–2.2)
Albumin: 4.5 g/dL (ref 3.7–4.7)
Alkaline Phosphatase: 87 IU/L (ref 44–121)
BUN/Creatinine Ratio: 17 (ref 10–24)
BUN: 18 mg/dL (ref 8–27)
Bilirubin Total: 0.4 mg/dL (ref 0.0–1.2)
CO2: 22 mmol/L (ref 20–29)
Calcium: 9.5 mg/dL (ref 8.6–10.2)
Chloride: 103 mmol/L (ref 96–106)
Creatinine, Ser: 1.08 mg/dL (ref 0.76–1.27)
Globulin, Total: 1.9 g/dL (ref 1.5–4.5)
Glucose: 105 mg/dL — ABNORMAL HIGH (ref 70–99)
Potassium: 4.9 mmol/L (ref 3.5–5.2)
Sodium: 138 mmol/L (ref 134–144)
Total Protein: 6.4 g/dL (ref 6.0–8.5)
eGFR: 68 mL/min/{1.73_m2} (ref >59)

## 2023-03-06 ENCOUNTER — Other Ambulatory Visit: Payer: Self-pay | Admitting: Cardiology

## 2023-03-21 ENCOUNTER — Encounter: Payer: Self-pay | Admitting: Cardiology

## 2023-04-19 ENCOUNTER — Other Ambulatory Visit: Payer: Self-pay | Admitting: Cardiology

## 2023-04-24 ENCOUNTER — Other Ambulatory Visit: Payer: Self-pay | Admitting: Cardiology

## 2023-05-14 ENCOUNTER — Telehealth: Payer: Self-pay | Admitting: Family Medicine

## 2023-05-14 DIAGNOSIS — I10 Essential (primary) hypertension: Secondary | ICD-10-CM

## 2023-05-14 DIAGNOSIS — R7301 Impaired fasting glucose: Secondary | ICD-10-CM

## 2023-05-14 DIAGNOSIS — Z5181 Encounter for therapeutic drug level monitoring: Secondary | ICD-10-CM

## 2023-05-14 DIAGNOSIS — R7989 Other specified abnormal findings of blood chemistry: Secondary | ICD-10-CM

## 2023-05-14 NOTE — Telephone Encounter (Signed)
Pt has a physical with you on Thursday and wants to come in tomorrow morning for blood work. Is this ok?

## 2023-05-14 NOTE — Telephone Encounter (Signed)
Orders were entered.  Okay to schedule fasting lab visit for Wednesday

## 2023-05-15 NOTE — Progress Notes (Signed)
Chief Complaint  Patient presents with   Medicare Wellness    Nonfasting AWV/CPE. Did not have Tdap, RSV, or Covid booster. Feels like his memory is worsening, not as good as it used to be. No other issues. Feels like he is taking a lot of pills (omits lunch pills 75% of the time per his wife). Are there any he can stop?    Eric Simmons. is a 82 y.o. male who presents for annual physical exam, Medicare wellness visit and follow-up on chronic medical conditions.  He is accompanied by his wife. He is asking if he can reduce the number of medications he takes.    HTN, CAD, HLD: He remains under the care of Dr. Mayford Knife, last seen in 02/2023. He had elevated calcium score (2461, 84%ile), and subsequently underwent cardiac cath (12/2021, 99% Ost RCA stenosis, 85% midRCA, 60% prox LAD with L>R collaterals).  Doing well on medical management. He is tolerating regimen (aspirin, imdur, metoprolol, losartan, amlodipine) without side effects. He wasn't able to tolerate higher doses of imdur, doing okay on 30mg  in the morning and 15mg  at night.  Hasn't needed any nitroglycerin.  Denies DOE, shortness of breath, chest pain palpitations, edema.  [facility] [place] [facility]and he discussed this with cardiologist). He walks 2 miles on the track at the gym at least 4 days/week.  HTN: He tries to follow low sodium diet.  Compliant with medications. BP's are only occasionally checked at home, not recently.  Wife recalls seeing up to 150 sometimes.  BP Readings from Last 3 Encounters:  05/16/23 120/70  03/04/23 113/62  01/03/23 116/67   Hyperlipidemia:   He is on Crestor, and zetia (added in 01/2022 to get LDL <55).  He denies side effects.  LDL at goal on last check.  LFT's were elevated on last check in May. Will recheck today. Lab Results  Component Value Date   CHOL 113 03/05/2023   HDL 47 03/05/2023   LDLCALC 53 03/05/2023   TRIG 61 03/05/2023   CHOLHDL 2.4 03/05/2023     Lab Results  Component Value Date   ALT 60 (H) 03/05/2023   AST 49 (H) 03/05/2023   ALKPHOS 87 03/05/2023   BILITOT 0.4 03/05/2023   He also has aortic atherosclerosis.  Compliant with statin.  He is under the care of [place] neuro Marlowe Kays, [place]) for mild neurocognitive disorder (had testing with Dr. Milbert Coulter), essential tremor and hand paresthesias.  Last seen in March 2024. He is doing well on primidone for tremor.  He previously declined EMG/NCV and splinting for the paresthesias (suspected possible CTS). He still has tingling in his hands only [facility], and resolves with moving them around. He denies any weakness, not dropping things. They also discussed his sialorrhea--he declined botox, atropine drops. Has been using lemon drops to try and stimulate swallowing (not too often).  He keeps the saliva in his mouth, doesn't swallow, and it comes out when he starts talking (isn't drooling). He reports this isn't too bothersome. Memory loss--MMSE was 28/30, felt to be stable. Memantine 5mg  at bedtime was started at that visit.  He isn't taking it.  He is scheduled for repeat neuropsych testing in 06/2023.  His wife notes that he puts the dishes away in different spots.  He gets frustrated when he can't remember where he put something.  He is still driving, doing most of the bills (wife double checks),  no major memory issues. He has questions about Namenda.  Impaired fasting glucose.  This has been very mild, ongoing/nonprogressive for years.   Admits to some sweets, multiple times/day, but small portions.  Has 3 spoonfuls of ice cream before bed nightly. 1/2 cookie at lunch.  Mainly has cookies and ice cream. Drinks sugar-free/caffeine-free Coke, part of a can. He has Marianne Sofia with dinner--thinks he gets the "light" version. Last A1c was 5.8% in 04/2022. Fasting sugar was 105 in May 2024. No polydipsia, polyuria (just nocturia).  BPH and ED:  He is under the care of Dr.  Retta Diones. Previously took tamsulosin, which was stopped due to dizziness and changed to finasteride 5mg . He subsequently stopped taking finasteride as well, and didn't really notice worsening of symptoms. He gets up once or twice a night to void. He previously was prescribed sildenafil 100mg  prn for ED. It stopped being effective.  He is no longer having sexual relations, also related to wife's issues.  Last seen by Dr. Retta Diones in 11/2022.  Was told that he didn't need yearly f/u, but pt prefers to continue. Will see Dr. Jennette Bill next year (Dr. Retta Diones is retiring).  Immunization History  Administered Date(s) Administered   COVID-19, mRNA, vaccine(Comirnaty)12 years and older 08/07/2022   Fluad Quad(high Dose 65+) 07/16/2019, 07/06/2020, 10/02/2021   Influenza Split 07/18/2011, 08/28/2012   Influenza, High Dose Seasonal PF 08/19/2014, 06/22/2015, 08/20/2016, 08/20/2017   Influenza,inj,Quad PF,6+ Mos 07/06/2013   Influenza-Unspecified 07/25/2018   PFIZER(Purple Top)SARS-COV-2 Vaccination 11/18/2019, 12/07/2019, 08/02/2020, 03/29/2021   Pneumococcal Conjugate-13 06/14/2014   Pneumococcal Polysaccharide-23 12/13/2011, 05/03/2022   Tdap 03/02/2011   Zoster Recombinant(Shingrix) 07/27/2022, 10/02/2022   Zoster, Live 03/25/2014   Last colonoscopy: 12/2017 (Dr. Lonzo Cloud adenomas x 4. He was told no follow up was needed (due to age) Last PSA: (no longer done due to age) Lab Results  Component Value Date   PSA 1.5 10/30/2017   PSA 1.73 12/27/2015   PSA 1.87 12/06/2014   Dentist: twice yearly   Ophtho: yearly  Exercise:  walking 2 miles on cushioned track at [facility] 4-5x/week. Weights 4-5x/week.  Yardwork  Patient Care Team: Joselyn Arrow, MD as PCP - General (Family Medicine) Quintella Reichert, MD as PCP - Cardiology (Cardiology) Verner Chol, [facility] (Inactive) as Pharmacist (Pharmacist) Sinda Du, MD as Consulting Physician (Ophthalmology) Iva Boop, MD as  Consulting Physician (Gastroenterology) Donzetta Starch, MD as Consulting Physician (Dermatology) Marcene Corning, MD as Consulting Physician (Orthopedic Surgery) Tat, Octaviano Batty, DO as Consulting Physician (Neurology) Marcine Matar, MD as Consulting Physician (Urology) Benson Setting, PT as Physical Therapist (Physical Therapy) Dentist: Dr. Pete Pelt    Depression Screening: Flowsheet Row Office Visit from 05/16/2023 in [facility]  PHQ-2 Total Score 0        Falls screen:     05/16/2023    2:44 PM 01/03/2023    8:50 AM 05/03/2022    9:47 AM 10/16/2021   11:19 AM 03/29/2021    1:49 PM  Fall Risk   Falls in the past year? 0 0 0 0 1  Number falls in past yr: 0 0 0 0   Injury with Fall? 0 0 0 0   Risk for fall due to : No Fall Risks  No Fall Risks    Follow up Falls evaluation completed Falls evaluation completed Falls evaluation completed       Functional Status Survey: Is the patient deaf or have difficulty hearing?: Yes (wife says decline, he says  no) Does the patient have difficulty seeing, even when wearing glasses/contacts?: Yes (wears reading glasses, and to drive) Does the patient have difficulty concentrating, remembering, or making decisions?: Yes (memory, decision making is fine) Does the patient have difficulty walking or climbing stairs?: Yes (will do one flight of stairs) Does the patient have difficulty dressing or bathing?: No Does the patient have difficulty doing errands alone such as visiting a doctor's office or shopping?: Yes (drives around town, not on highways)  Mini-Cog Scoring: 3  clock drawing incorrect (numbers outside of circle, no hands on clock--made darker marks for the time)--unchanged from last year..  3/3 recall     End of Life Discussion:  Patient has a living will and medical power of attorney, scanned.    PMH, PSH, SH and FH reviewed and updated  Outpatient Encounter Medications as of 05/16/2023  Medication Sig Note    amLODipine (NORVASC) 5 MG tablet TAKE ONE TABLET BY MOUTH DAILY AT NOON    aspirin EC 81 MG tablet Take 1 tablet (81 mg total) by mouth in the morning. Swallow whole.    Cholecalciferol (VITAMIN D3) 50 MCG (2000 UT) TABS Take 1 tablet by mouth daily.    ezetimibe (ZETIA) 10 MG tablet Take 1 tablet (10 mg total) by mouth daily.    isosorbide mononitrate (IMDUR) 30 MG 24 hr tablet Take 1 tablet (30 mg total) by mouth 2 (two) times daily. Take 1 tablet (30 mg total) by mouth every morning. Take 1/2 tablet (15 mg total) every night. 05/16/2023: 1 in the am and 1/2 qhs   losartan (COZAAR) 50 MG tablet TAKE ONE TABLET EVERY DAY IN THE MORNING    metoprolol succinate (TOPROL XL) 25 MG 24 hr tablet Take 0.5 tablets (12.5 mg total) by mouth daily.    primidone (MYSOLINE) 50 MG tablet TAKE ONE TABLET AT BEDTIME    rosuvastatin (CRESTOR) 20 MG tablet TAKE ONE TABLET BY MOUTH ONCE DAILY    acetaminophen (TYLENOL) 500 MG tablet Take 500 mg by mouth every 6 (six) hours as needed. (Patient not taking: Reported on 05/16/2023) 05/16/2023: As needed   Lidocaine-Glycerin (PREPARATION H EX) Place 1 application  rectally daily as needed (irritation). (Patient not taking: Reported on 05/16/2023) 05/16/2023: As needed   memantine (NAMENDA) 5 MG tablet Take 1 tablet (5 mg total) by mouth at bedtime. (Patient not taking: Reported on 03/04/2023)    nitroGLYCERIN (NITROSTAT) 0.4 MG SL tablet Place 1 tablet (0.4 mg total) under the tongue every 5 (five) minutes as needed for chest pain. (Patient not taking: Reported on 05/16/2023) 05/16/2023: As needed   Propylene Glycol (SYSTANE COMPLETE) 0.6 % SOLN Place 1 drop into both eyes daily as needed (dry eyes). (Patient not taking: Reported on 05/16/2023) 05/16/2023: As needed   [DISCONTINUED] acetaminophen (TYLENOL) 650 MG CR tablet Take 650 mg by mouth every 8 (eight) hours as needed for pain.    [DISCONTINUED] finasteride (PROSCAR) 5 MG tablet Take 5 mg by mouth at bedtime. (Patient not  taking: Reported on 03/04/2023)    No facility-administered encounter medications on file as of 05/16/2023.   Allergies  Allergen Reactions   Codeine Nausea And Vomiting    violent vomiting    ROS: The patient denies anorexia, fever, weight changes, headaches, decreased hearing, ear pain, hoarseness, chest pain, palpitations, dizziness, syncope, dyspnea on exertion, cough, swelling, nausea, vomiting, diarrhea, abdominal pain, melena, hematochezia, indigestion/heartburn, hematuria, dysuria, genital lesions, joint pains, weakness, suspicious skin lesions (sees derm regularly, gets treated), depression,  anxiety, insomnia, abnormal bleeding/bruising, or enlarged lymph nodes   +ED. Denies urinary complaints--up 1-2x/night. [facility]ing[facility], short-lived, unchanged. No weakness. No joint pains. Afternoon nap x 1-2 hours. Memory issues per HPI.    PHYSICAL EXAM:  BP 120/70   Pulse (!) 56   Ht 6' (1.829 m)   Wt 176 lb (79.8 kg)   BMI 23.87 kg/m   Wt Readings from Last 3 Encounters:  05/16/23 176 lb (79.8 kg)  03/04/23 176 lb 3.2 oz (79.9 kg)  01/03/23 182 lb (82.6 kg)    General Appearance:   Alert, cooperative, no distress, appears stated age    Head:   Normocephalic, without obvious abnormality, atraumatic    Eyes:   PERRL, conjunctiva/corneas clear, EOM's intact, fundi benign. Brown pigmentation to upper portion of R eye (rest is blue), unchanged    Ears:   Normal TM's and external ear canals.   Nose:   Normal, no drainage or sinus tenderness  Throat:   Normal mucosa, no lesions  Neck:   Supple, no lymphadenopathy; thyroid: no enlargement/tenderness/nodules; no carotid bruit or JVD. C-spine nontender  Back:   Spine nontender, no curvature, ROM normal, no CVA tenderness    Lungs:   Clear to auscultation bilaterally without wheezes, rales or ronchi; respirations unlabored    Chest Wall:   No tenderness or deformity    Heart:   Regular rate and rhythm, S1 and S2  normal, no murmur, rub or gallop    Breast Exam:  No chest wall tenderness, masses or gynecomastia    Abdomen:   Soft, non-tender, nondistended, normoactive bowel sounds, no masses, no hepatosplenomegaly    Genitalia:   Deferred to urologist  Rectal:   Deferred to urologist  Extremities:   No clubbing, cyanosis or edema.   Pulses:   2+ and symmetric all extremities    Skin:   Skin color, turgor normal, no rashes. Skin is dry, especially on legs, with some actinic changes.  Lymph  nodes:   Cervical, supraclavicular nodes normal    Neurologic:   Normal strength, sensation and gait; reflexes 2+ and symmetric throughout. Mild intentional tremor bilaterally, also apparent in his handwriting              Psych:    Normal mood, affect, hygiene and grooming   Lab Results  Component Value Date   HGBA1C 6.3 (A) 05/16/2023     ASSESSMENT/PLAN:   Annual physical exam  Medicare annual wellness visit, subsequent  IFG (impaired fasting glucose) - encouraged to cut back on sugar in his diet, A1c higher than in the past. Cont regular exercise. f/u 6 mos, recheck A1c - Plan: HgB A1c, Comprehensive metabolic panel  Essential hypertension - well controlled on current regimen - Plan: Comprehensive metabolic panel  Elevated LFTs - noted on last labs done by cardiology. Recheck today - Plan: Comprehensive metabolic panel  Pure hypercholesterolemia - lipids at goal on current regimen per cardiology  Coronary artery disease involving native coronary artery of native heart with angina pectoris (HCC) - controlled on current regimen  Mild neurocognitive disorder, concerns for Alzheimer's disease - MMSE still good; +/- whether Namenda necessary at this point. Hasn't been taking. Await repeat neuropsych eval. Cognitive tips discussed  Benign familial tremor - very mild, on primidone  Medication monitoring encounter - Plan: Comprehensive metabolic panel, CBC with Differential/Platelet  Release c-met, cbc  (note nonfasting glu)   Recommended at least 30 minutes of aerobic activity at  least 5 days/week, weight bearing exercise at least 2x/week; proper sunscreen use reviewed; healthy diet and alcohol recommendations (less than or equal to 2 drinks/day) reviewed; regular seatbelt use; changing batteries in smoke detectors. Immunization recommendations discussed--continue high dose flu shots yearly. Updated bivalent COVID booster is recommended when it becomes available in the Fall. Tdap past due, reminded to get from pharmacy. RSV vaccine recommended in the Fall (if he didn't get last year, to confirm with pharmacy). Colonoscopy is no longer needed   MOST form reviewed and updated.  Full Code, Full Care.  F/u 1 year, sooner prn.   Medicare Attestation I have personally reviewed: The patient's medical and social history Their use of alcohol, tobacco or illicit drugs Their current medications and supplements The patient's functional ability including ADLs,fall risks, home safety risks, cognitive, and hearing and visual impairment Diet and physical activities Evidence for depression or mood disorders  The patient's weight, height, BMI have been recorded in the chart.  I have made referrals, counseling, and provided education to the patient based on review of the above and I have provided the patient with a written personalized care plan for preventive services.

## 2023-05-15 NOTE — Patient Instructions (Addendum)
  HEALTH MAINTENANCE RECOMMENDATIONS:  It is recommended that you get at least 30 minutes of aerobic exercise at least 5 days/week (for weight loss, you may need as much as 60-90 minutes). This can be any activity that gets your heart rate up. This can be divided in 10-15 minute intervals if needed, but try and build up your endurance at least once a week.  Weight bearing exercise is also recommended twice weekly.  Eat a healthy diet with lots of vegetables, fruits and fiber.  "Colorful" foods have a lot of vitamins (ie green vegetables, tomatoes, red peppers, etc).  Limit sweet tea, regular sodas and alcoholic beverages, all of which has a lot of calories and sugar.  Up to 2 alcoholic drinks daily may be beneficial for men (unless trying to lose weight, watch sugars).  Drink a lot of water.  Sunscreen of at least SPF 30 should be used on all sun-exposed parts of the skin when outside between the hours of 10 am and 4 pm (not just when at beach or pool, but even with exercise, golf, tennis, and yard work!)  Use a sunscreen that says "broad spectrum" so it covers both UVA and UVB rays, and make sure to reapply every 1-2 hours.  Remember to change the batteries in your smoke detectors when changing your clock times in the spring and fall.  Carbon monoxide detectors are recommended for your home.  Use your seat belt every time you are in a car, and please drive safely and not be distracted with cell phones and texting while driving.    Mr. Bent , Thank you for taking time to come for your Medicare Wellness Visit. I appreciate your ongoing commitment to your health goals. Please review the following plan we discussed and let me know if I can assist you in the future.   This is a list of the screening recommended for you and due dates:  Health Maintenance  Topic Date Due   DTaP/Tdap/Td vaccine (2 - Td or Tdap) 03/01/2021   COVID-19 Vaccine (6 - 2023-24 season) 10/02/2022   Flu Shot  05/30/2023    Medicare Annual Wellness Visit  05/15/2024   Pneumonia Vaccine  Completed   Zoster (Shingles) Vaccine  Completed   HPV Vaccine  Aged Out   TdaP (tetanus booster) is past due.  You need to get this from the pharmacy.  Please get this as soon as you can.  Please get RSV from the pharmacy in the Fall.  Please continue to get yearly high dose flu shots in the Fall. I recommended getting the updated COVID booster in the Fall.  Your A1c is higher--getting closer to diabetes. Please cut back on sugars in the diet Debroah Loop Palmers, cookies and ice cream. Cut back on bread, rice, pasta, potatoes ([place] foods). Eat more whole grains.  Please get a hearing evaluation.  ([facility] hearing, [facility] hearing, or others).  You can hold off on the Namenda until after your repeat testing, if you'd like. If you are looking to cut out pills, the vitamin D likely isn't necessary (at least in the summer, consider taking this in the winter).

## 2023-05-16 ENCOUNTER — Encounter: Payer: Self-pay | Admitting: Family Medicine

## 2023-05-16 ENCOUNTER — Ambulatory Visit: Payer: PPO | Admitting: Family Medicine

## 2023-05-16 VITALS — BP 120/70 | HR 56 | Ht 72.0 in | Wt 176.0 lb

## 2023-05-16 DIAGNOSIS — I10 Essential (primary) hypertension: Secondary | ICD-10-CM | POA: Diagnosis not present

## 2023-05-16 DIAGNOSIS — R7301 Impaired fasting glucose: Secondary | ICD-10-CM

## 2023-05-16 DIAGNOSIS — Z5181 Encounter for therapeutic drug level monitoring: Secondary | ICD-10-CM

## 2023-05-16 DIAGNOSIS — G250 Essential tremor: Secondary | ICD-10-CM

## 2023-05-16 DIAGNOSIS — I25119 Atherosclerotic heart disease of native coronary artery with unspecified angina pectoris: Secondary | ICD-10-CM | POA: Diagnosis not present

## 2023-05-16 DIAGNOSIS — Z0000 Encounter for general adult medical examination without abnormal findings: Secondary | ICD-10-CM

## 2023-05-16 DIAGNOSIS — R7989 Other specified abnormal findings of blood chemistry: Secondary | ICD-10-CM

## 2023-05-16 DIAGNOSIS — G3184 Mild cognitive impairment, so stated: Secondary | ICD-10-CM | POA: Diagnosis not present

## 2023-05-16 DIAGNOSIS — E7800 Pure hypercholesterolemia, unspecified: Secondary | ICD-10-CM | POA: Diagnosis not present

## 2023-05-16 LAB — POCT GLYCOSYLATED HEMOGLOBIN (HGB A1C): Hemoglobin A1C: 6.3 % — AB (ref 4.0–5.6)

## 2023-05-17 LAB — COMPREHENSIVE METABOLIC PANEL
ALT: 34 IU/L (ref 0–44)
AST: 33 IU/L (ref 0–40)
Albumin: 4.4 g/dL (ref 3.7–4.7)
Alkaline Phosphatase: 88 IU/L (ref 44–121)
BUN/Creatinine Ratio: 20 (ref 10–24)
BUN: 24 mg/dL (ref 8–27)
Bilirubin Total: 0.4 mg/dL (ref 0.0–1.2)
CO2: 23 mmol/L (ref 20–29)
Calcium: 9.8 mg/dL (ref 8.6–10.2)
Chloride: 102 mmol/L (ref 96–106)
Creatinine, Ser: 1.18 mg/dL (ref 0.76–1.27)
Globulin, Total: 2 g/dL (ref 1.5–4.5)
Glucose: 104 mg/dL — ABNORMAL HIGH (ref 70–99)
Potassium: 4.3 mmol/L (ref 3.5–5.2)
Sodium: 139 mmol/L (ref 134–144)
Total Protein: 6.4 g/dL (ref 6.0–8.5)
eGFR: 61 mL/min/{1.73_m2} (ref >59)

## 2023-05-17 LAB — CBC WITH DIFFERENTIAL/PLATELET
Basophils Absolute: 0.1 10*3/uL (ref 0.0–0.2)
Basos: 1 %
EOS (ABSOLUTE): 0.2 10*3/uL (ref 0.0–0.4)
Eos: 3 %
Hematocrit: 43.8 % (ref 37.5–51.0)
Hemoglobin: 14.3 g/dL (ref 13.0–17.7)
Immature Grans (Abs): 0 10*3/uL (ref 0.0–0.1)
Immature Granulocytes: 0 %
Lymphocytes Absolute: 2 10*3/uL (ref 0.7–3.1)
Lymphs: 27 %
MCH: 29.7 pg (ref 26.6–33.0)
MCHC: 32.6 g/dL (ref 31.5–35.7)
MCV: 91 fL (ref 79–97)
Monocytes Absolute: 0.9 10*3/uL (ref 0.1–0.9)
Monocytes: 12 %
Neutrophils Absolute: 4.2 10*3/uL (ref 1.4–7.0)
Neutrophils: 57 %
Platelets: 213 10*3/uL (ref 150–450)
RBC: 4.82 x10E6/uL (ref 4.14–5.80)
RDW: 12.6 % (ref 11.6–15.4)
WBC: 7.4 10*3/uL (ref 3.4–10.8)

## 2023-05-20 ENCOUNTER — Ambulatory Visit: Payer: PPO | Admitting: Family Medicine

## 2023-06-04 ENCOUNTER — Other Ambulatory Visit: Payer: Self-pay | Admitting: Physician Assistant

## 2023-06-04 DIAGNOSIS — G250 Essential tremor: Secondary | ICD-10-CM

## 2023-06-26 ENCOUNTER — Other Ambulatory Visit: Payer: Self-pay | Admitting: Cardiology

## 2023-07-08 ENCOUNTER — Ambulatory Visit: Payer: PPO | Admitting: Physician Assistant

## 2023-07-08 ENCOUNTER — Encounter: Payer: Self-pay | Admitting: Physician Assistant

## 2023-07-08 VITALS — BP 103/58 | HR 54 | Ht 72.0 in | Wt 177.0 lb

## 2023-07-08 DIAGNOSIS — G3184 Mild cognitive impairment, so stated: Secondary | ICD-10-CM

## 2023-07-08 DIAGNOSIS — G250 Essential tremor: Secondary | ICD-10-CM | POA: Diagnosis not present

## 2023-07-08 MED ORDER — MEMANTINE HCL 10 MG PO TABS: 10.0000 mg | ORAL_TABLET | Freq: Every evening | ORAL | 11 refills | Status: DC

## 2023-07-08 NOTE — Progress Notes (Signed)
Assessment/Plan:   Dementia likely due to Alzheimer's Disease    Eric Chroman. is a very pleasant 82 y.o. RH male with a history of hypertension, hyperlipidemia, anxiety, depression, ET with sialorrhea seen today in follow up for memory loss. Patient is on memantine 5 mg nightly. He has subjective memory complaints, however, he is able to participate on his IADLs and drive without difficulty and during his visit his memory appears stable..     Follow up in 6  months. Increase memantine to 10 mg nightly, side effects discussed  Recommend good control of her cardiovascular risk factors Continue to control mood as per PCP. May benefit from low dose antidepressant as his cognitive concerns may be affecting his mood Recommend increasing socialization     Tremors and Sialorrhea   This is monitored as it is associated with PD.  Not a candidate for Ach due to increased risk of confusion and falls. In today's visit he denies any issues with swallowing and he has less sialorrhea than before.  Tremors are controlled, no other parkinsonian signs are noted. Continue Primidone 50 mg nightly, side effects discussed Continue physical activity  Subjective:    This patient is accompanied in the office by his wife who supplements the history.  Previous records as well as any outside records available were reviewed prior to todays visit. Patient was last seen on 01/03/23 with MMSE 28/30    Any changes in memory since last visit? "About the same".  He has some difficulty remembering recent conversations and especially names which brings stress to him, new information.  He is less talkative and less social due to difficulty remembering names of people that he knows, especially at [place]. He likes to work on his yard, walk miles a day and going to [employer] several times a week.  He does not enjoy doing crossword puzzles. Plays Scrabble with his wife.  He enjoys watching TV, especially movies.  repeats  oneself?  Endorsed. Disoriented when walking into a room?  Patient denies.   Leaving objects in unusual places?  May misplace things but not in unusual places    Wandering behavior?  Denies.   Any personality changes since last visit?  As mentioned before, he may be withdrawn at times, he is aware of his memory issues.  He states that his daughter reminds him about his age, affecting his mood  Any worsening depression?:  Denies.   Hallucinations or paranoia?  Denies.   Seizures? denies    Any sleep changes?  He sleeps well.  Denies vivid dreams, REM behavior or sleepwalking   Sleep apnea?   Denies.   Any hygiene concerns? Denies.  Independent of bathing and dressing?  Endorsed  Does the patient needs help with medications?  Wife is in charge   Who is in charge of the finances?  Patient is in charge     Any changes in appetite?  denies     Patient have trouble swallowing? Denies.This is resolved since his last visit. Sialorrhea much improved.   Does the patient cook? No Any headaches?   denies   Chronic back pain  denies   Ambulates with difficulty? Denies. He walks 4 times a day.  [facility] Recent falls or head injuries? Denies.     Unilateral weakness, numbness or tingling? Denies.   Any tremors?  Controlled with primidone.No other Parkinsonian symptoms.  Any anosmia?  Denies   Any incontinence  of urine?  Denies  Any bowel dysfunction?   Denies      Patient lives with wife  Does the patient drive?  Yes, denies getting lost   Initial Visit 3/7/23The patient is seen in neurologic consultation at the request of Joselyn Arrow, MD for the evaluation of memory.  The patient is accompanied by his wife who supplements the history.  The patient feels that the memory "may be a little bit worse, but not that much ".  His main frustration is remembering people's names, this bothers him.  He still tries to monitor taking his level of cognition by attending groups to discuss a  variety of topics, including politics, which is very stimulating for him.  They also discussed basketball during the season.  He may have some issues with reading comprehension, and processing which may be slower than prior.  He likes to walk about 2 miles a day, 5 days a week, he [facility], and work on the yard.  He denies repeating himself.  He denies being disoriented when walking into her room, or leaving objects in unusual places.  He denies wandering behavior.  He continues to drive, denies feeling lost.  His mood is good, denies depression or irritability.  He sleeps well, denies vivid dreams, sleepwalking, hallucinations or paranoia.  There are no hygiene concerns.  He is independent of bathing and dressing.  His medications are in a pillbox, and the finances and are on autopay.  His appetite is good, denies trouble swallowing, but he does have sialorrhea, at times bothersome, but he does not wet the pillow as of yet.  He does not cook much.  Denies leaving the stove or the faucet on.  He denies any headaches, double vision, dizziness, focal numbness or tingling, unilateral weakness or anosmia.  No history of seizures.  Denies urine incontinence, retention, constipation or diarrhea.  Denies OSA, alcohol or tobacco.  As for his essential tremor, he reports that they are about the same as prior.  He is on primidone 50 mg nightly.  He does not want to change his dosage at this time, and his wife agrees as well.   Current prescribed movement disorder medications: Primidone, 50 mg nightly   Neurocognitive Testing 12/14/21 Dr. Milbert Coulter . Briefly, results suggested impairment surrounding semantic fluency, as well as additional performance variability and/or relative weakness across processing speed and all aspects of learning and memory. Relative to Mr. Vonbargen's past evaluations, mild decline was seen in several domains. Most prominent was essentially all aspects of list and shape-based memory tasks, as  well as delayed retrieval aspects of a story-based task. He also exhibited greater difficulty across response inhibition, while much milder declines were seen across processing speed and cognitive flexibility. Performances were generally stable across attention/concentration, response inhibition, expressive language, and visuospatial abilities. Past neuroimaging suggesting disproportionate mesial temporal lobe volume loss is a known risk factor for Alzheimer's disease, as can be the presence of superficial siderosis. Since 2020, there has been steady, documented decline across list and shape-based memory tasks, both in terms of the acquisition of information and its retention after time has passed which would also increase the risk for a memory-based neurodegenerative illness. However, story-based information has remained somewhat resilient to significant decline thus far. While consistent impairment in semantic fluency is also a common finding in Alzheimer's disease, confrontation naming has also remained resilient to decline over the years. While the potential for a quite slowly progressing case of  Alzheimer's disease remains plausible, he has yet to fully fall in line with typical patterns of impairment across testing.    PREVIOUS MEDICATIONS:   CURRENT MEDICATIONS:  Outpatient Encounter Medications as of 07/08/2023  Medication Sig   acetaminophen (TYLENOL) 500 MG tablet Take 500 mg by mouth every 6 (six) hours as needed.   amLODipine (NORVASC) 5 MG tablet TAKE ONE TABLET BY MOUTH DAILY AT NOON   aspirin EC 81 MG tablet Take 1 tablet (81 mg total) by mouth in the morning. Swallow whole.   Cholecalciferol (VITAMIN D3) 50 MCG (2000 UT) TABS Take 1 tablet by mouth daily.   ezetimibe (ZETIA) 10 MG tablet Take 1 tablet (10 mg total) by mouth daily.   isosorbide mononitrate (IMDUR) 30 MG 24 hr tablet Take 1 tablet (30 mg total) by mouth 2 (two) times daily. Take 1 tablet (30 mg total) by mouth every morning.  Take 1/2 tablet (15 mg total) every night.   Lidocaine-Glycerin (PREPARATION H EX) Place 1 application  rectally daily as needed (irritation).   losartan (COZAAR) 50 MG tablet TAKE ONE TABLET EVERY DAY IN THE MORNING   metoprolol succinate (TOPROL-XL) 25 MG 24 hr tablet take ONE-HALF TABLET BY MOUTH DAILY   nitroGLYCERIN (NITROSTAT) 0.4 MG SL tablet Place 1 tablet (0.4 mg total) under the tongue every 5 (five) minutes as needed for chest pain.   primidone (MYSOLINE) 50 MG tablet TAKE ONE TABLET AT BEDTIME   Propylene Glycol (SYSTANE COMPLETE) 0.6 % SOLN Place 1 drop into both eyes daily as needed (dry eyes).   rosuvastatin (CRESTOR) 20 MG tablet TAKE ONE TABLET BY MOUTH ONCE DAILY   [DISCONTINUED] memantine (NAMENDA) 5 MG tablet Take 1 tablet (5 mg total) by mouth at bedtime.   memantine (NAMENDA) 10 MG tablet Take 1 tablet (10 mg total) by mouth at bedtime.   No facility-administered encounter medications on file as of 07/08/2023.       01/03/2023   12:00 PM 05/08/2018   10:18 AM  MMSE - Mini Mental State Exam  Orientation to time 3 5  Orientation to time comments  July 11th or 12th  Orientation to Place 5 5  Registration 3 3  Attention/ Calculation 5 5  Recall 3 2  Language- name 2 objects 2 2  Language- repeat 1 1  Language- follow 3 step command 3 3  Language- read & follow direction 1 1  Write a sentence 1 1  Write a sentence-comments  significant tremor  Copy design 1 1  Total score 28 29      01/02/2022   10:00 AM 06/18/2019    1:00 PM 05/20/2018    8:55 AM  Montreal Cognitive Assessment   Visuospatial/ Executive (0/5) 5 5 5   Naming (0/3) 3 3 3   Attention: Read list of digits (0/2) 2 2 2   Attention: Read list of letters (0/1) 1 1 1   Attention: Serial 7 subtraction starting at 100 (0/3) 3 3 2   Language: Repeat phrase (0/2) 2 2 2   Language : Fluency (0/1) 1 1 1   Abstraction (0/2) 1 1 1   Delayed Recall (0/5) 0 1 0  Orientation (0/6) 5 5 6   Total 23 24 23   Adjusted Score  (based on education)  24 23    Objective:     PHYSICAL EXAMINATION:    VITALS:   Vitals:   07/08/23 0852  BP: (!) 103/58  Pulse: (!) 54  SpO2: 98%  Weight: 177 lb (80.3 kg)  Height: 6' (  1.829 m)    GEN:  The patient appears stated age and is in NAD. HEENT:  Normocephalic, atraumatic.   Neurological examination:  General: NAD, well-groomed, appears stated age. Orientation: The patient is alert. Oriented to person, place and date Cranial nerves: There is good facial symmetry.The speech is fluent and clear. No aphasia or dysarthria. Fund of knowledge is appropriate. Recent and remote memory are impaired. Attention and concentration are reduced.  Able to name objects and repeat phrases.  Hearing is mildly decreased to conversational tone.   Sensation: Sensation is intact to light touch throughout Motor: Strength is at least antigravity x4. DTR's 2/4 in UE/LE     Movement examination: Tone: There is normal tone in the UE/LE.  No cogwheeling is noted Abnormal movements: Minimal right greater than left intention tremor on B UE, similar to prior exam.  No facial tremor. no myoclonus.  No asterixis.   Coordination:  There is no decremation with RAM's. Normal finger to nose  Gait and Station: The patient has no difficulty arising out of a deep-seated chair without the use of the hands. The patient's stride length is good.  Gait is cautious and narrow.    Thank you for allowing [place] the opportunity to participate in the care of this nice patient. Please do not hesitate to contact us for any questions or concerns.   Total time spent on today's visit was 35 minutes dedicated to this patient today, preparing to see patient, examining the patient, ordering tests and/or medications and counseling the patient, documenting clinical information in the EHR or other health record, independently interpreting results and communicating results to the patient/family, discussing treatment and goals,  answering patient's questions and coordinating care.  Cc:  Joselyn Arrow, MD  Marlowe Kays 07/08/2023 12:32 PM

## 2023-07-08 NOTE — Patient Instructions (Addendum)
It was a pleasure to see you today at our office.   Recommendations:  Follow up in 6 months  Continue Memantine , increase 10  mg at night . Side effects were discussed   Continue Primidone 50 mg nightly  Keep Neurosychological evaluation for Oct 2024 as cheduled     RECOMMENDATIONS FOR ALL PATIENTS WITH MEMORY PROBLEMS: 1. Continue to exercise (Recommend 30 minutes of walking everyday, or 3 hours every week) 2. Increase social interactions - continue going to [place] and enjoy social gatherings with friends and family 3. Eat healthy, avoid fried foods and eat more fruits and vegetables 4. Maintain adequate blood pressure, blood sugar, and blood cholesterol level. Reducing the risk of stroke and cardiovascular disease also helps promoting better memory. 5. Avoid stressful situations. Live a simple life and avoid aggravations. Organize your time and prepare for the next day in anticipation. 6. Sleep well, avoid any interruptions of sleep and avoid any distractions in the bedroom that may interfere with adequate sleep quality 7. Avoid sugar, avoid sweets as there is a strong link between excessive sugar intake, diabetes, and cognitive impairment We discussed the Mediterranean diet, which has been shown to help patients reduce the risk of progressive memory disorders and reduces cardiovascular risk. This includes eating fish, eat fruits and green leafy vegetables, nuts like almonds and hazelnuts, walnuts, and also use olive oil. Avoid fast foods and fried foods as much as possible. Avoid sweets and sugar as sugar use has been linked to worsening of memory function.  There is always a concern of gradual progression of memory problems. If this is the case, then we may need to adjust level of care according to patient needs. Support, both to the patient and caregiver, should then be put into place.    FALL PRECAUTIONS: Be cautious when walking. Scan the area for obstacles that may increase the risk  of trips and falls. When getting up in the mornings, sit up at the edge of the bed for a few minutes before getting out of bed. Consider elevating the bed at the head end to avoid drop of blood pressure when getting up. Walk always in a well-lit room (use night lights in the walls). Avoid area rugs or power cords from appliances in the middle of the walkways. Use a walker or a cane if necessary and consider physical therapy for balance exercise. Get your eyesight checked regularly.  FINANCIAL OVERSIGHT: Supervision, especially oversight when making financial decisions or transactions is also recommended.  HOME SAFETY: Consider the safety of the kitchen when operating appliances like stoves, microwave oven, and blender. Consider having supervision and share cooking responsibilities until no longer able to participate in those. Accidents with firearms and other hazards in the house should be identified and addressed as well.   ABILITY TO BE LEFT ALONE: If patient is unable to contact 911 operator, consider using LifeLine, or when the need is there, arrange for someone to stay with patients. Smoking is a fire hazard, consider supervision or cessation. Risk of wandering should be assessed by caregiver and if detected at any point, supervision and safe proof recommendations should be instituted.  MEDICATION SUPERVISION: Inability to self-administer medication needs to be constantly addressed. Implement a mechanism to ensure safe administration of the medications.   DRIVING: Regarding driving, in patients with progressive memory problems, driving will be impaired. We advise to have someone else do the driving if trouble finding directions or if minor accidents are reported. Independent driving assessment  is available to determine safety of driving.   If you are interested in the driving assessment, you can contact the following:  [employer] in [place] [phone]  Driver  Rehabilitative Services [phone]  [facility] [phone]  [facility] [phone] or [phone]

## 2023-07-23 ENCOUNTER — Other Ambulatory Visit: Payer: Self-pay | Admitting: Cardiology

## 2023-08-05 ENCOUNTER — Encounter: Payer: Self-pay | Admitting: Family Medicine

## 2023-08-05 ENCOUNTER — Ambulatory Visit (INDEPENDENT_AMBULATORY_CARE_PROVIDER_SITE_OTHER): Payer: PPO | Admitting: Family Medicine

## 2023-08-05 VITALS — BP 174/94 | HR 67 | Temp 98.3°F | Ht 72.0 in | Wt 176.0 lb

## 2023-08-05 DIAGNOSIS — U071 COVID-19: Secondary | ICD-10-CM

## 2023-08-05 MED ORDER — NIRMATRELVIR/RITONAVIR (PAXLOVID)TABLET
3.0000 | ORAL_TABLET | Freq: Two times a day (BID) | ORAL | 0 refills | Status: AC
Start: 2023-08-05 — End: 2023-08-10

## 2023-08-05 NOTE — Progress Notes (Signed)
Start time: 4:27 End time: 4:45  Virtual Visit via Telephone Note  I connected with Eric Simmons. on 08/05/23 by telephone and verified that I am speaking with the correct person using two identifiers. Patient reported inability to do video visit.  Location: Patient: home Provider: office   I discussed the limitations of evaluation and management by telemedicine and the availability of in person appointments. The patient expressed understanding and agreed to proceed.  History of Present Illness:  Chief Complaint  Patient presents with   Cough    VIRTUAL positive covid. Started with cough and congestion that started Saturday evening. Was on an 8 day trip to [place]. Once returned home symptoms started. Did covid test today and was positive.    Yesterday morning he realized he was sick--he was coughing a lot.  Cough is productive of whitish yellow phlegm. Denies headache, body aches. Feels a little weak. Denies any shortness of breath, chest pain. No n/v/d. No loss of taste or smell.  He has been taking robitussin DM twice daily, which helps, and tylenol as needed (just once daily). Taking meds regularly, no missed doses (BP high).  COVID test was positive yesterday, and again today. Wife is also +.  PMH, PSH, SH reviewed  Outpatient Encounter Medications as of 08/05/2023  Medication Sig Note   acetaminophen (TYLENOL) 650 MG CR tablet Take 650 mg by mouth every 8 (eight) hours as needed for pain. 08/05/2023: Took one this am   amLODipine (NORVASC) 5 MG tablet TAKE ONE TABLET BY MOUTH DAILY AT NOON    aspirin EC 81 MG tablet Take 1 tablet (81 mg total) by mouth in the morning. Swallow whole.    Cholecalciferol (VITAMIN D3) 50 MCG (2000 UT) TABS Take 1 tablet by mouth daily.    Dextromethorphan-guaiFENesin (ROBITUSSIN COUGH/CHEST DM MAX PO) Take 30 mLs by mouth as needed. 08/05/2023: Took this am   ezetimibe (ZETIA) 10 MG tablet Take 1 tablet (10 mg total) by mouth  daily.    isosorbide mononitrate (IMDUR) 30 MG 24 hr tablet Take 1 tablet (30 mg total) by mouth 2 (two) times daily. Take 1 tablet (30 mg total) by mouth every morning. Take 1/2 tablet (15 mg total) every night. 05/16/2023: 1 in the am and 1/2 qhs   losartan (COZAAR) 50 MG tablet TAKE ONE TABLET EVERY DAY IN THE MORNING    memantine (NAMENDA) 10 MG tablet Take 1 tablet (10 mg total) by mouth at bedtime.    metoprolol succinate (TOPROL-XL) 25 MG 24 hr tablet take ONE-HALF TABLET BY MOUTH DAILY    nirmatrelvir/ritonavir (PAXLOVID) 20 x 150 MG & 10 x 100MG  TABS Take 3 tablets by mouth 2 (two) times daily for 5 days. (Take nirmatrelvir 150 mg two tablets twice daily for 5 days and ritonavir 100 mg one tablet twice daily for 5 days) Patient GFR is 61    primidone (MYSOLINE) 50 MG tablet TAKE ONE TABLET AT BEDTIME    rosuvastatin (CRESTOR) 20 MG tablet TAKE ONE TABLET BY MOUTH ONCE DAILY    [DISCONTINUED] acetaminophen (TYLENOL) 500 MG tablet Take 500 mg by mouth every 6 (six) hours as needed. 05/16/2023: As needed   Lidocaine-Glycerin (PREPARATION H EX) Place 1 application  rectally daily as needed (irritation). (Patient not taking: Reported on 08/05/2023) 08/05/2023: As needed   nitroGLYCERIN (NITROSTAT) 0.4 MG SL tablet PLACE ONE TABLET UNDER THE TONGUE EVERY FIVE MINUTES AS NEEDED FOR CHEST PAIN (Patient not taking: Reported on 08/05/2023)  Propylene Glycol (SYSTANE COMPLETE) 0.6 % SOLN Place 1 drop into both eyes daily as needed (dry eyes). (Patient not taking: Reported on 08/05/2023) 08/05/2023: As needed   No facility-administered encounter medications on file as of 08/05/2023.   NOT taking paxlovid prior to today's visit  Allergies  Allergen Reactions   Codeine Nausea And Vomiting    violent vomiting    ROS: no fever, headache, dizziness, chest pain, shortness of breath, n/v/d, loss of taste or smell, bleeding, bruising, rash. States BP's are usually much lower.     Observations/Objective:  BP (!) 174/94   Pulse 67   Temp 98.3 F (36.8 C) (Tympanic)   Ht 6' (1.829 m)   Wt 176 lb (79.8 kg)   BMI 23.87 kg/m   Alert and oriented. No sniffling, throat-clearing or coughing noted during visit. Exam is limited due to the virtual nature of the visit.   Assessment and Plan:   Paxlovid GFR 61  Drink plenty of water. Start the Paxlovid tomorrow morning, since you already took your rosuvastatin today. Take the Paxlovid twice daily as directed, for 5 days. Do not take the rosuvastatin for the 5 days that you take the Paxlovid. Stop the Primidone this evening (don't take it tonight), don't take it again until you are finished with the paxlovid. Double check with the pharmacy to ensure there aren't any other interactions or suggestions.  Continue tylenol if needed for any pain or fever. Continue the robitussin--you can take that as directed on the bottle, if needed for cough/congestion. If have worsening cough that isn't adequately controlled by the robitussin, let us know and we can send in a different cough medication.  Please go to the emergency room if you develop high fever, chest pain, shortness of breath, confusion, pain with breathing, or other new/worrisome symptoms.   Follow Up Instructions:    I discussed the assessment and treatment plan with the patient. The patient was provided an opportunity to ask questions and all were answered. The patient agreed with the plan and demonstrated an understanding of the instructions.   The patient was advised to call back or seek an in-person evaluation if the symptoms worsen or if the condition fails to improve as anticipated.  I spent 22 minutes dedicated to the care of this patient, including pre-visit review of records, face to face time, post-visit ordering of testing and documentation.    Lavonda Jumbo, MD

## 2023-08-05 NOTE — Patient Instructions (Signed)
Drink plenty of water. Start the Paxlovid tomorrow morning, since you already took your rosuvastatin today. Take the Paxlovid twice daily as directed, for 5 days. Do not take the rosuvastatin for the 5 days that you take the Paxlovid. Stop the Primidone this evening (don't take it tonight), don't take it again until you are finished with the paxlovid. Double check with the pharmacy to ensure there aren't any other interactions or suggestions.  Continue tylenol if needed for any pain or fever. Continue the robitussin--you can take that as directed on the bottle, if needed for cough/congestion. If have worsening cough that isn't adequately controlled by the robitussin, let us know and we can send in a different cough medication.  Please go to the emergency room if you develop high fever, chest pain, shortness of breath, confusion, pain with breathing, or other new/worrisome symptoms.

## 2023-08-15 ENCOUNTER — Encounter: Payer: Self-pay | Admitting: Psychology

## 2023-08-15 DIAGNOSIS — I700 Atherosclerosis of aorta: Secondary | ICD-10-CM | POA: Insufficient documentation

## 2023-08-16 ENCOUNTER — Ambulatory Visit: Payer: PPO | Admitting: Psychology

## 2023-08-16 ENCOUNTER — Encounter: Payer: Self-pay | Admitting: Psychology

## 2023-08-16 DIAGNOSIS — G3184 Mild cognitive impairment, so stated: Secondary | ICD-10-CM

## 2023-08-16 DIAGNOSIS — G309 Alzheimer's disease, unspecified: Secondary | ICD-10-CM | POA: Diagnosis not present

## 2023-08-16 DIAGNOSIS — R4189 Other symptoms and signs involving cognitive functions and awareness: Secondary | ICD-10-CM

## 2023-08-16 NOTE — Progress Notes (Signed)
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 are some concerns that he may not have a full appreciation for the extent of ongoing impairment.   During testing, sustained attention was appropriate. Task engagement was adequate and he persisted when challenged. Overall, Mr. Sheils was cooperative with the clinical interview and subsequent testing procedures.   Adequacy of Effort: The validity of neuropsychological testing is limited by the extent to which the individual being tested may be assumed to have exerted adequate effort during testing. Mr. Buchberger expressed his intention to perform to the best of his abilities and exhibited adequate task engagement and persistence. Scores across stand-alone and embedded performance validity measures were variable but largely within expectation. His sole below expectation performance is believed to be due to true and significant memory impairment rather than poor engagement or attempts to perform poorly. As such, the results of the current evaluation are believed to be a valid representation of Mr. Manna's  current cognitive functioning.  Test Results: Mr. Dewoody was largely oriented at the time of the current evaluation. He did incorrectly state the year, stating "2083" on two separate occasions. He was also unable to name the full clinic name.   Intellectual abilities based upon educational and vocational attainment were estimated to be in the average range. Premorbid abilities were estimated to be within the average range based upon a single-word reading test.   Processing speed was exceptionally low to below average. Basic attention was above average. More complex attention (e.g., working memory) was average. Executive functioning was variable, ranging from the exceptionally low to average normative ranges.  Assessed receptive language abilities were average. Likewise, Mr. Garrity did not exhibit any difficulties comprehending task instructions and answered all questions asked of him appropriately. Assessed expressive language was variable. Phonemic fluency was average, semantic fluency was exceptionally low, and confrontation naming was exceptionally low.     Assessed visuospatial/visuoconstructional abilities were mildly variable but overall appropriate, ranging from the below average to above average normative ranges.    Learning (i.e., encoding) of novel verbal and visual information was well below average to below average. Spontaneous delayed recall (i.e., retrieval) of previously learned information was exceptionally low to well below average. Retention rates were 0% across a story learning task, 0% across a list learning task, and 50% (raw score of 1) across a shape learning task. Performance across recognition tasks was below average to average, suggesting some evidence for information consolidation.   Results of emotional screening instruments suggested that recent symptoms of generalized anxiety were in the minimal range, while symptoms of depression were within normal limits. A screening  instrument assessing recent sleep quality suggested the presence of minimal sleep dysfunction.  Tables of Scores:   Percentile - Normative Descriptor > 98 - Exceptionally High 91-97 - Well Above Average 75-90 - Above Average 25-74 -  Average 9-24 - Below Average 2-8 - Well Below Average < 2 - Exceptionally Low      Orientation: November 2020 November 2021   Raw Score Raw Score  NAB Orientation, Form 1 29/29 26/29      Cognitive Screening:     Raw Score Raw Score  SLUMS: --- 21/30      Intellectual Functioning:     Standard Score Standard Score  Test of Premorbid Functioning: 112 109      Memory:    Wechsler Memory Scale (WMS-IV):                       Raw Score Raw Score (Scaled Score)    Logical Memory I 33/53 30/53 (11)    Logical Memory II 10/39 17/39 (11)    Logical Memory Recognition 15/23 15/23      Hopkins Verbal Learning Test (HVLT-R), Form 1: Raw Score Raw Score (T Score)    Total Trials 1-3 20/36 18/36 (38)    Delayed Recall 2/12 2/12 (35)    Recognition Discrimination Index 4 9 (46)      True Positives 8 12      False Positives 4 3  *From Duff (2016)        Brief Visuospatial Memory Test (BVMT-R), Form 1: Raw Score Raw Score (T Score)    Total Trials 1-3 14/36 7/36 (39)    Delayed Recall 2/12 2/12 (38)    Recognition Discrimination Index 6 5 (48)      Recognition Hits 6/6 5/6 (44)      False Positive Errors 0 0 (54)  *From Duff (2016)        Attention/Executive Function:    Trail Making Test (TMT): Raw Score Raw Score (Scaled Score)    Part A 57 secs.,  0 errors 81 secs.,  0 errors (6)    Part B 124 secs.,  0 errors 133 secs.,  0 errors (10)  *Based on [employer] Older Normative Studies (MOANS)        Symbol Digit Modalities Test (SDMT): Raw Score Raw Score (Z-Score)    Oral 30 23 (-2.2)      NAB Attention Module, Form 1: T Score T Score    Digits Forward 59 60    Digits Backwards 50 41      D-KEFS Color-Word Interference Test: Raw Score Raw  Score (Scaled Score)    Color Naming 40 secs. 40 secs. (8)    Word Reading 28 secs. 29 secs. (9)    Inhibition 145 secs. 145 secs. (4)      Total Errors 5 errors 3 errors (10)    Inhibition/Switching 106 secs. 127 secs. (7)      Total Errors 3 errors 4 errors (10)      Language:    Verbal Fluency Test: Raw Score Raw Score    Phonemic Fluency (FAS) 29 37    Animal Fluency 7 13       NAB Language Module, Form 1: T Score T Score    Auditory Comprehension --- 54    Naming 30/31 (58) 30/31 (62)      Visuospatial/Visuoconstruction:     Raw Score Raw Score  Clock Drawing: 5/10 8/10      NAB Spatial Module, Form 1: T Score T Score    Visual Discrimination 38 43       Scaled Score Scaled Score  WAIS-IV Matrix Reasoning: 11 12      Mood and Personality:  Raw Score Raw Score  Geriatric Depression Scale: 9 5  Geriatric Anxiety Scale: 5 4    Somatic 3 3    Cognitive 2 1    Affective 0 0      Additional Questionnaires:     Raw Score Raw Score  PROMIS Sleep Disturbance Questionnaire: 17 14   Note: This summary of test scores accompanies the interpretive report and should not be considered in isolation without reference to the appropriate sections in the text. Descriptors are based on appropriate normative data and may be adjusted based on clinical judgment. Terms such as "Within Normal Limits" and "Outside Normal Limits" are used when a more specific description of the test score cannot be determined. Descriptors refer to the current evaluation only.        Percentile - Normative Descriptor > 98 - Exceptionally High 91-97 - Well Above Average 75-90 - Above Average 25-74 - Average 9-24 - Below Average 2-8 - Well Below Average < 2 - Exceptionally Low        Validity: February 2023 Current  DESCRIPTOR        DCT: --- --- --- Within Normal Limits  HVLT-R RDI: --- --- --- Within Normal Limits  BVMT-R Retention Percentage: --- --- --- Outside Normal Limits        Orientation:        Raw Score Raw Score Percentile   NAB Orientation, Form 1 27/29 26/29 --- ---        Cognitive Screening:       Raw Score Raw Score Percentile   SLUMS: 16/30 17/30 --- ---        Intellectual Functioning:       Standard Score Standard Score Percentile   Test of Premorbid Functioning: 113 109 73 Average        Memory:      Wechsler Memory Scale (WMS-IV):                       Raw Score (Scaled Score) Raw Score (Scaled Score) Percentile     Logical Memory I 26/53 (9) 16/53 (6) 9 Below Average    Logical Memory II 7/39 (7) 0/39 (1) <1 Exceptionally Low    Logical Memory Recognition 16/23 14/23 10-16 Below Average        Hopkins Verbal Learning Test (HVLT-R), Form 1: Raw Score (T Score) Raw Score (T Score) Percentile     Total Trials 1-3 19/36 (40) 16/36 (35) 7 Well Below Average    Delayed Recall 1/12 (32) 0/12 (29) 2 Well Below Average    Recognition Discrimination Index 5 (26) 8 (41) 18 Below Average      True Positives 10 11 --- ---      False Positives 5 3 --- ---  *From Cristela Felt (2016)            Brief Visuospatial Memory Test (BVMT-R), Form 1: Raw Score (T Score) Raw Score (T Score) Percentile     Total Trials 1-3 3/36 (33) 4/36 (35) 7 Well Below Average    Delayed Recall 3/12 (41) 1/12 (34) 5 Well Below Average    Recognition Discrimination Index 3 (30) 5 (48) 42 Average      Recognition Hits 3/6 (19) 5/6 (44) 27 Average      False Positive Errors 0 (54) 0 (54) 66 Average  *From Cristela Felt (2016)            Attention/Executive Function:      Trail Making Test (  TMT): Raw Score (Scaled Score) Raw Score (Scaled Score) Percentile     Part A 69 secs.,  0 errors (7) 76 secs.,  0 errors (6) 9 Below Average    Part B 147 secs.,  0 errors (9) 120 secs.,  0 errors (10) 50 Average  *Based on [employer] Older Normative Studies (MOANS)            Symbol Digit Modality Test (SDMT): Raw Score (Z-Score) Raw Score (Z-Score) Percentile     Oral 19 (-2.5) 19 (-2.5) 1 Exceptionally Low         NAB Attention Module, Form 1: T Score T Score Percentile     Digits Forward 51 60 84 Above Average    Digits Backwards 41 46 34 Average        D-KEFS Color-Word Interference Test: Raw Score (Scaled Score) Raw Score (Scaled Score) Percentile     Color Naming 45 secs. (6) 48 secs. (5) 5 Well Below Average    Word Reading 30 secs. (8) 32 secs. (7) 16 Below Average    Inhibition 156 secs. (2) 180 secs. (1) <1 Exceptionally Low      Total Errors 6 errors (8) 4 errors (9) 37 Average    Inhibition/Switching 106 secs. (9) 126 secs. (7) 16 Below Average      Total Errors 1 error (13) 5 errors (9) 37 Average        Language:      Verbal Fluency Test: Raw Score (T Score) Raw Score (T Score) Percentile     Phonemic Fluency (FAS) 40 (50) 37 (46) 34 Average    Animal Fluency 8 (24) 6 (20) <1 Exceptionally Low         NAB Language Module, Form 1: T Score T Score Percentile     Auditory Comprehension 39 54 66 Average    Naming 30/31 (62) 20/31 (19) <1 Exceptionally Low        Visuospatial/Visuoconstruction:       Raw Score Raw Score Percentile   Clock Drawing: 5/10 8/10 --- Within Normal Limits        NAB Spatial Module, Form 1: T Score T Score Percentile     Visual Discrimination 39 39 14 Below Average         Scaled Score Scaled Score Percentile   WAIS-IV Matrix Reasoning: 12 12 75 Above Average        Mood and Personality:       Raw Score Raw Score Percentile   Geriatric Depression Scale: 4 3 --- Within Normal Limits  Geriatric Anxiety Scale: 6 1 --- Minimal    Somatic 5 0 --- Minimal    Cognitive 1 1 --- Minimal    Affective 0 0 --- Minimal        Additional Questionnaires:       Raw Score Raw Score Percentile   PROMIS Sleep Disturbance Questionnaire: 13 10 --- None to Slight   Informed Consent and Coding/Compliance:   The current evaluation represents a clinical evaluation for the purposes previously outlined by the referral source and is in no way reflective of a forensic  evaluation.   Mr. Bagnoli was provided with a verbal description of the nature and purpose of the present neuropsychological evaluation. Also reviewed were the foreseeable risks and/or discomforts and benefits of the procedure, limits of confidentiality, and mandatory reporting requirements of this provider. The patient was given the opportunity to ask questions and receive answers about the evaluation. Oral consent to participate was  provided by the patient.   This evaluation was conducted by Newman Nickels, Ph.D., ABPP-CN, board certified clinical neuropsychologist. Mr. Likens completed a clinical interview with Dr. Milbert Coulter, billed as one unit [phone], and 135 minutes of cognitive testing and scoring, billed as one unit [phone] and four additional units [id number]. Psychometrist Wallace Keller, B.S. assisted Dr. Milbert Coulter with test administration and scoring procedures. As a separate and discrete service, one unit [id number] and two units [phone] were billed for Dr. Tammy Sours time spent in interpretation and report writing.

## 2023-08-16 NOTE — Progress Notes (Signed)
   Psychometrician Note   Cognitive testing was administered to Eric Simmons. by Wallace Keller, B.S. (psychometrist) under the supervision of Dr. Newman Nickels, Ph.D., licensed psychologist on 08/16/2023. Mr. Drown did not appear overtly distressed by the testing session per behavioral observation or responses across self-report questionnaires. Rest breaks were offered.    The battery of tests administered was selected by Dr. Newman Nickels, Ph.D. with consideration to Mr. Donson's current level of functioning, the nature of his symptoms, emotional and behavioral responses during interview, level of literacy, observed level of motivation/effort, and the nature of the referral question. This battery was communicated to the psychometrist. Communication between Dr. Newman Nickels, Ph.D. and the psychometrist was ongoing throughout the evaluation and Dr. Newman Nickels, Ph.D. was immediately accessible at all times. Dr. Newman Nickels, Ph.D. provided supervision to the psychometrist on the date of this service to the extent necessary to assure the quality of all services provided.    Eric Simmons. will return within approximately 1-2 weeks for an interactive feedback session with Dr. Milbert Coulter at which time his test performances, clinical impressions, and treatment recommendations will be reviewed in detail. Mr. Pevey understands he can contact our office should he require our assistance before this time.  A total of 135 minutes of billable time were spent face-to-face with Mr. Finerty by the psychometrist. This includes both test administration and scoring time. Billing for these services is reflected in the clinical report generated by Dr. Newman Nickels, Ph.D.  This note reflects time spent with the psychometrician and does not include test scores or any clinical interpretations made by Dr. Milbert Coulter. The full report will follow in a separate note.

## 2023-08-20 DIAGNOSIS — L853 Xerosis cutis: Secondary | ICD-10-CM | POA: Diagnosis not present

## 2023-08-20 DIAGNOSIS — L812 Freckles: Secondary | ICD-10-CM | POA: Diagnosis not present

## 2023-08-20 DIAGNOSIS — D1801 Hemangioma of skin and subcutaneous tissue: Secondary | ICD-10-CM | POA: Diagnosis not present

## 2023-08-20 DIAGNOSIS — D225 Melanocytic nevi of trunk: Secondary | ICD-10-CM | POA: Diagnosis not present

## 2023-08-20 DIAGNOSIS — Z85828 Personal history of other malignant neoplasm of skin: Secondary | ICD-10-CM | POA: Diagnosis not present

## 2023-08-20 DIAGNOSIS — L821 Other seborrheic keratosis: Secondary | ICD-10-CM | POA: Diagnosis not present

## 2023-08-26 ENCOUNTER — Ambulatory Visit: Payer: PPO | Admitting: Psychology

## 2023-08-26 DIAGNOSIS — G309 Alzheimer's disease, unspecified: Secondary | ICD-10-CM | POA: Diagnosis not present

## 2023-08-26 DIAGNOSIS — G3184 Mild cognitive impairment, so stated: Secondary | ICD-10-CM

## 2023-08-26 NOTE — Progress Notes (Signed)
   Neuropsychology Feedback Session Eric Simmons. [facility] [place] Department of Neurology  Reason for Referral:   Thearon Gochanour. is a 82 y.o. right-handed Caucasian male referred by Marlowe Kays, PA-C, to characterize his current cognitive functioning and assist with diagnostic clarity and treatment planning in the context of mild cognitive impairment and concerns for progressive cognitive decline.   Feedback:   Mr. Rappa completed a comprehensive neuropsychological evaluation on 08/16/2023. Please refer to that encounter for the full report and recommendations. Briefly, results suggested severe impairment surrounding semantic fluency, confrontation naming, and delayed retrieval aspects of memory. An additional weakness was exhibited across encoding (i.e., learning) aspects of memory, while performance variability was exhibited across processing speed, executive functioning, and recognition/consolidation aspects of memory. Relative to his most recent evaluation in February 2023, quite significant decline was exhibited across confrontation naming. Additional more mild decline was exhibited across encoding and retrieval aspects of verbal memory, retrieval aspects of visual memory, and response inhibition. Looking more broadly, compared to his initial evaluation in November 2020, decline across the aforementioned areas is easily observed and quite pronounced in several cases. Regarding etiology, I continue to have primary concerns surrounding underlying Alzheimer's disease. Mr. Tonner was fully amnestic (i.e., 0% retention) across both verbal tasks after a brief delay and only exhibited 50% retention (raw score of 1) across a visual task. This suggests evidence for rapid forgetting which has progressively worsened over the years, representing the hallmark testing characteristic of Alzheimer's disease. Further impairment surrounding confrontation naming and semantic fluency would follow classic  disease progression patterns. 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  Mr. Ochsner was accompanied by his wife and two sons during the current feedback session. Content of the current session focused on the results of his neuropsychological evaluation. Mr. Teem was given the opportunity to ask questions and his questions were answered. He was encouraged to reach out should additional questions arise. Copies of his report were provided at the conclusion of the visit.      One unit [phone] was billed for Dr. Tammy Sours time spent preparing for, conducting, and documenting the current feedback session with Mr. Slavens.

## 2023-09-05 ENCOUNTER — Encounter: Payer: Self-pay | Admitting: Cardiology

## 2023-09-05 ENCOUNTER — Ambulatory Visit: Payer: PPO | Attending: Cardiology | Admitting: Cardiology

## 2023-09-05 VITALS — BP 100/60 | HR 62 | Ht 72.0 in | Wt 175.2 lb

## 2023-09-05 DIAGNOSIS — I10 Essential (primary) hypertension: Secondary | ICD-10-CM

## 2023-09-05 DIAGNOSIS — I2510 Atherosclerotic heart disease of native coronary artery without angina pectoris: Secondary | ICD-10-CM | POA: Diagnosis not present

## 2023-09-05 DIAGNOSIS — I2583 Coronary atherosclerosis due to lipid rich plaque: Secondary | ICD-10-CM

## 2023-09-05 DIAGNOSIS — E7800 Pure hypercholesterolemia, unspecified: Secondary | ICD-10-CM

## 2023-09-05 MED ORDER — LOSARTAN POTASSIUM 25 MG PO TABS: 25.0000 mg | ORAL_TABLET | Freq: Every day | ORAL | 3 refills | Status: DC

## 2023-09-05 NOTE — Progress Notes (Signed)
Cardiology Office Note    Date:  09/05/2023   ID:  Eric Chroman., DOB 1940/11/23, MRN [id number]  PCP:  Joselyn Arrow, MD  Cardiologist:  Armanda Magic, MD   Chief Complaint  Patient presents with   Coronary Artery Disease   Hypertension   Hyperlipidemia    History of Present Illness:  Eric Simmons. is a 82 y.o. male with a hx of HLD and ASCAD with cath 01/12/2022 showing 99% oRCA, 85% mRCA and 60% pLAD with L>R collaterals.  Medical management was recommended.    He is here today for followup and is doing well.  He denies any chest pain or pressure, SOB, DOE, PND, orthopnea, LE edema, dizziness, palpitations or syncope. He is compliant with his meds and is tolerating meds with no SE.    Past Medical History:  Diagnosis Date   Adenomatous colon polyp 03/2005   tubular adenoma   Aortic atherosclerosis    BCC (basal cell carcinoma), face    left face, R ear (04/2014), nose 05/2016   Benign familial tremor 08/29/2012   Coronary artery disease involving native coronary artery of native heart with angina pectoris    Lipid rich plaque; cardiac cath 01/12/2022 which showed 99% oRCA, 85% mRCA and 60% pLAD with L>R collaterals. Medical management was recommended.   Dysphagia 12/28/2015   Erectile dysfunction    Essential hypertension    Hemorrhoids    internal and external   IFG (impaired fasting glucose) 12/10/2012   Glucose 111 11/2012    Insomnia    Mild cognitive impairment due to Alzheimer's disease 09/11/2019   Mild mitral regurgitation    Primary osteoarthritis of left hip 04/18/2021   Pseudogout of knee 07/2003   left   Pulmonary nodule    Pure hypercholesterolemia    Syncope and collapse     Past Surgical History:  Procedure Laterality Date   COLONOSCOPY  6/06, 05/2010   Dr. Leone Payor   CORONARY PRESSURE/FFR STUDY N/A 01/12/2022   Procedure: INTRAVASCULAR PRESSURE WIRE/FFR STUDY;  Surgeon: Orbie Pyo, MD;  Location: [facility];  Service:  Cardiovascular;  Laterality: N/A;   LEFT HEART CATH AND CORONARY ANGIOGRAPHY N/A 01/12/2022   Procedure: LEFT HEART CATH AND CORONARY ANGIOGRAPHY;  Surgeon: Orbie Pyo, MD;  Location: [facility];  Service: Cardiovascular;  Laterality: N/A;   MOHS SURGERY  6/07   L face, Dr. Park Liter   POLYPECTOMY     TONSILLECTOMY     TOTAL HIP ARTHROPLASTY Left 04/18/2021   Procedure: LEFT TOTAL HIP ARTHROPLASTY ANTERIOR APPROACH;  Surgeon: Marcene Corning, MD;  Location: [facility];  Service: Orthopedics;  Laterality: Left;   VASECTOMY      Current Medications: No outpatient medications have been marked as taking for the 09/05/23 encounter (Office Visit) with Quintella Reichert, MD.    Allergies:   Codeine   Social History   Socioeconomic History   Marital status: Married    Spouse name: Not on file   Number of children: 2   Years of education: 16   Highest education level: Bachelor's degree (e.g., BA, AB, BS)  Occupational History   Occupation: Retired  Tobacco Use   Smoking status: Former    Types: Cigars   Smokeless tobacco: Never   Tobacco comments:    as a teenager  Vaping Use   Vaping status: Never Used  Substance and Sexual Activity   Alcohol use: Not Currently    Comment: rare non-alcoholic  beer   Drug use: No   Sexual activity: Not Currently  Other Topics Concern   Not on file  Social History Narrative   Widowed 2013 (after wife's long battle with MS).  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  Children live in [place], [place] and [place]. 4 grandchildren. Vicenta Aly has 9      Married March 2016 (Sudie)   Right handed   Drinks caffeine--now mostly decaffeinated   Two story home      Updated 04/2023   Social Determinants of Health   Financial Resource Strain: Not on file  Food Insecurity: Not on file   Transportation Needs: Not on file  Physical Activity: Not on file  Stress: Not on file  Social Connections: Not on file     Family History:  The patient's family history includes Cancer (age of onset: 51) in his mother; Colon cancer in his mother; Colon polyps in his mother; Diabetes in his mother; Heart disease in his brother, father, and son; Hyperlipidemia in his brother, brother, brother, son, and son; Hypertension in his father; Sleep apnea in his son; Syncope episode in his son; Tremor in his brother and brother.   ROS:   Please see the history of present illness.    ROS All other systems reviewed and are negative.      No data to display             PHYSICAL EXAM:   VS:  BP 100/60   Pulse 62   Ht 6' (1.829 m)   Wt 175 lb 3.2 oz (79.5 kg)   SpO2 96%   BMI 23.76 kg/m    GEN: Well nourished, well developed in no acute distress HEENT: Normal NECK: No JVD; No carotid bruits LYMPHATICS: No lymphadenopathy CARDIAC:RRR, no murmurs, rubs, gallops RESPIRATORY:  Clear to auscultation without rales, wheezing or rhonchi  ABDOMEN: Soft, non-tender, non-distended MUSCULOSKELETAL:  No edema; No deformity  SKIN: Warm and dry NEUROLOGIC:  Alert and oriented x 3 PSYCHIATRIC:  Normal affect  Wt Readings from Last 3 Encounters:  09/05/23 175 lb 3.2 oz (79.5 kg)  08/05/23 176 lb (79.8 kg)  07/08/23 177 lb (80.3 kg)      Studies/Labs Reviewed:   EKG Interpretation Date/Time:  Thursday September 05 2023 08:55:32 EST Ventricular Rate:  62 PR Interval:  180 QRS Duration:  114 QT Interval:  394 QTC Calculation: 399 R Axis:   -21  Text Interpretation: Normal sinus rhythm with sinus arrhythmia Incomplete left bundle branch block Left anterior fasicular block Left ventricular hypertrophy with repolarization abnormality ( R in aVL , Cornell product ) When compared with ECG of 12-Jan-2022 12:42, Incomplete left bundle branch block is now Present Confirmed by Armanda Magic [phone])  on 09/05/2023 9:00:03 AM    Recent Labs: 05/16/2023: ALT 34; BUN 24; Creatinine, Ser 1.18; Hemoglobin 14.3; Platelets 213; Potassium 4.3; Sodium 139   Lipid Panel    Component Value Date/Time   CHOL 113 03/05/2023 0840   TRIG 61 03/05/2023 0840   HDL 47 03/05/2023 0840   CHOLHDL 2.4 03/05/2023 0840   CHOLHDL 2.9 10/30/2017 0944   VLDL 17 08/20/2016 1051   LDLCALC 53 03/05/2023 0840   LDLCALC 80 10/30/2017 0944    Additional studies/ records that were reviewed today include:  2D echo 03/2020 IMPRESSIONS  1. Left ventricular ejection fraction, by estimation, is 60 to 65%. The  left ventricle has normal function. The left ventricle has no regional  wall motion abnormalities. Left ventricular diastolic parameters are  indeterminate.   2. Right ventricular systolic function is normal. The right ventricular  size is normal. There is normal pulmonary artery systolic pressure. The  estimated right ventricular systolic pressure is 16.5 mmHg.   3. The mitral valve is grossly normal. Mild mitral valve regurgitation.  No evidence of mitral stenosis.   4. The aortic valve is tricuspid. Aortic valve regurgitation is not  visualized. Mild aortic valve sclerosis is present, with no evidence of  aortic valve stenosis.   5. The inferior vena cava is normal in size with greater than 50%  respiratory variability, suggesting right atrial pressure of 3 mmHg.   Cardiac Cath 03/2020 Study Highlights    Nuclear stress EF: 56%. There was no ST segment deviation noted during stress. No T wave inversion was noted during stress. The study is normal. This is a low risk study. The left ventricular ejection fraction is normal (55-65%).   1. There are reduced counts in the mid inferior segment on rest and stress imaging consistent with diaphragm attenuation.  2. This is a normal study without evidence of ischemia or infarction.  3. Normal LVEF, 56%.  4. This is a low-risk study.     ASSESSMENT:     1. Coronary artery disease due to lipid rich plaque   2. Primary hypertension   3. Pure hypercholesterolemia       PLAN:  In order of problems listed above:  1.  ASCAD with chronic stable angina -nuclear stress test showed no ischemia in 03/2020 -2D echo showed normal LVF in June 2021 -coronary CTA showed 70-99% oRCA, 50-69% RCA crux, 50%dLM, 70-99% oLAD, 50-69% OM1 -cardiac cath 01/12/2022 which showed 99% oRCA, 85% mRCA and 60% pLAD with L>R collaterals.  Medical management was recommended.  -He has not had any anginal symptoms since I saw him last -Continue prescription drug management with aspirin 81 mg daily, amlodipine 5 mg daily, Imdur 30 mg every morning and 15 mg every afternoon, Toprol-XL 12.5 mg daily and Crestor 20 mg daily with as needed refills  2.  HTN -BP is controlled exam today and actually on the soft side -Continue drug management with amlodipine 5 mg daily,Toprol-XL 12.5 mg daily with as needed refills -I have recommended that he decrease Losartan to 25mg  daily -I have personally reviewed and interpreted outside labs performed by patient's PCP which showed serum creatinine 1.18 and potassium 4.3 on 05/16/2023  3.  HLD -LDL goal < 50 -I have personally reviewed and interpreted outside labs performed by patient's PCP which showed LDL 53 and HDL 47 on 03/05/2023 -Continue prescription drug management with Zetia 10 mg daily Crestor 20 mg daily with as needed refills  Followup with me in 6 months  Medication Adjustments/Labs and Tests Ordered: Current medicines are reviewed at length with the patient today.  Concerns regarding medicines are outlined above.  Medication changes, Labs and Tests ordered today are listed in the Patient Instructions below.  There are no Patient Instructions on file for this visit.   Signed, Armanda Magic, MD  09/05/2023 9:16 AM    [facility] [address] [place], [place], [place]  [address] Phone: [phone]; Fax:  [phone]

## 2023-09-05 NOTE — Patient Instructions (Signed)
Medication Instructions:  Please DECREASE your dose of losartan to 25 mg daily.  *If you need a refill on your cardiac medications before your next appointment, please call your pharmacy*   Lab Work: None.  If you have labs (blood work) drawn today and your tests are completely normal, you will receive your results only by: MyChart Message (if you have MyChart) OR A paper copy in the mail If you have any lab test that is abnormal or we need to change your treatment, we will call you to review the results.   Testing/Procedures: None.   Follow-Up: At [facility], you and your health needs are our priority.  As part of our continuing mission to provide you with exceptional heart care, we have created designated Provider Care Teams.  These Care Teams include your primary Cardiologist (physician) and Advanced Practice Providers (APPs -  Physician Assistants and Nurse Practitioners) who all work together to provide you with the care you need, when you need it.  We recommend signing up for the patient portal called "MyChart".  Sign up information is provided on this After Visit Summary.  MyChart is used to connect with patients for Virtual Visits (Telemedicine).  Patients are able to view lab/test results, encounter notes, upcoming appointments, etc.  Non-urgent messages can be sent to your provider as well.   To learn more about what you can do with MyChart, go to [email or web address].    Your next appointment:   6 month(s)  Provider:   Armanda Magic, MD

## 2023-09-05 NOTE — Addendum Note (Signed)
Addended by: Luellen Pucker on: 09/05/2023 09:25 AM   Modules accepted: Orders

## 2023-09-18 ENCOUNTER — Encounter: Payer: Self-pay | Admitting: Physician Assistant

## 2023-09-18 ENCOUNTER — Ambulatory Visit: Payer: PPO | Admitting: Physician Assistant

## 2023-09-18 VITALS — Resp 20 | Ht 72.0 in

## 2023-09-18 DIAGNOSIS — G3184 Mild cognitive impairment, so stated: Secondary | ICD-10-CM | POA: Diagnosis not present

## 2023-09-18 DIAGNOSIS — G309 Alzheimer's disease, unspecified: Secondary | ICD-10-CM | POA: Diagnosis not present

## 2023-09-18 NOTE — Patient Instructions (Addendum)
It was a pleasure to see you today at our office.   Recommendations:  Follow up in 6 months  Continue Memantine 10  mg at night . Side effects were discussed   Continue Primidone 50 mg nightly      RECOMMENDATIONS FOR ALL PATIENTS WITH MEMORY PROBLEMS: 1. Continue to exercise (Recommend 30 minutes of walking everyday, or 3 hours every week) 2. Increase social interactions - continue going to [place] and enjoy social gatherings with friends and family 3. Eat healthy, avoid fried foods and eat more fruits and vegetables 4. Maintain adequate blood pressure, blood sugar, and blood cholesterol level. Reducing the risk of stroke and cardiovascular disease also helps promoting better memory. 5. Avoid stressful situations. Live a simple life and avoid aggravations. Organize your time and prepare for the next day in anticipation. 6. Sleep well, avoid any interruptions of sleep and avoid any distractions in the bedroom that may interfere with adequate sleep quality 7. Avoid sugar, avoid sweets as there is a strong link between excessive sugar intake, diabetes, and cognitive impairment We discussed the Mediterranean diet, which has been shown to help patients reduce the risk of progressive memory disorders and reduces cardiovascular risk. This includes eating fish, eat fruits and green leafy vegetables, nuts like almonds and hazelnuts, walnuts, and also use olive oil. Avoid fast foods and fried foods as much as possible. Avoid sweets and sugar as sugar use has been linked to worsening of memory function.  There is always a concern of gradual progression of memory problems. If this is the case, then we may need to adjust level of care according to patient needs. Support, both to the patient and caregiver, should then be put into place.    FALL PRECAUTIONS: Be cautious when walking. Scan the area for obstacles that may increase the risk of trips and falls. When getting up in the mornings, sit up at the  edge of the bed for a few minutes before getting out of bed. Consider elevating the bed at the head end to avoid drop of blood pressure when getting up. Walk always in a well-lit room (use night lights in the walls). Avoid area rugs or power cords from appliances in the middle of the walkways. Use a walker or a cane if necessary and consider physical therapy for balance exercise. Get your eyesight checked regularly.  FINANCIAL OVERSIGHT: Supervision, especially oversight when making financial decisions or transactions is also recommended.  HOME SAFETY: Consider the safety of the kitchen when operating appliances like stoves, microwave oven, and blender. Consider having supervision and share cooking responsibilities until no longer able to participate in those. Accidents with firearms and other hazards in the house should be identified and addressed as well.   ABILITY TO BE LEFT ALONE: If patient is unable to contact 911 operator, consider using LifeLine, or when the need is there, arrange for someone to stay with patients. Smoking is a fire hazard, consider supervision or cessation. Risk of wandering should be assessed by caregiver and if detected at any point, supervision and safe proof recommendations should be instituted.  MEDICATION SUPERVISION: Inability to self-administer medication needs to be constantly addressed. Implement a mechanism to ensure safe administration of the medications.   DRIVING: Regarding driving, in patients with progressive memory problems, driving will be impaired. We advise to have someone else do the driving if trouble finding directions or if minor accidents are reported. Independent driving assessment is available to determine safety of driving.   If  you are interested in the driving assessment, you can contact the following:  [employer] in [place] [phone]  [facility] [phone]  [facility]  [phone]  [facility] [phone] or [phone]

## 2023-09-18 NOTE — Progress Notes (Signed)
Patient requested a visit to discuss the findings from his recent neuropsychological evaluation, yielding a diagnosis of Mild Cognitive Impairment due to Alzheimer's disease. All of the questions were answered. He is on memantine 10 mg daily, tolerating well.  Patient and wife were appreciative of the time spent with them. It is a pleasure caring for this nice patient .  Continue present therapy  Follow up in 6 months  Recommend good control of cardiovascular risk factors.   Recommend increasing physical activity

## 2023-10-05 ENCOUNTER — Other Ambulatory Visit: Payer: Self-pay | Admitting: Physician Assistant

## 2023-10-05 ENCOUNTER — Other Ambulatory Visit (HOSPITAL_BASED_OUTPATIENT_CLINIC_OR_DEPARTMENT_OTHER): Payer: Self-pay | Admitting: Cardiology

## 2023-10-05 DIAGNOSIS — G250 Essential tremor: Secondary | ICD-10-CM

## 2023-10-28 ENCOUNTER — Telehealth: Payer: PPO | Admitting: Medical

## 2023-10-28 VITALS — BP 118/65 | HR 67 | Wt 175.0 lb

## 2023-10-28 DIAGNOSIS — R051 Acute cough: Secondary | ICD-10-CM

## 2023-10-28 DIAGNOSIS — J111 Influenza due to unidentified influenza virus with other respiratory manifestations: Secondary | ICD-10-CM

## 2023-10-28 MED ORDER — OSELTAMIVIR PHOSPHATE 75 MG PO CAPS: 75.0000 mg | ORAL_CAPSULE | Freq: Two times a day (BID) | ORAL | 0 refills | Status: DC

## 2023-10-28 MED ORDER — BENZONATATE 200 MG PO CAPS
200.0000 mg | ORAL_CAPSULE | Freq: Three times a day (TID) | ORAL | 0 refills | Status: DC | PRN
Start: 2023-10-28 — End: 2023-11-01

## 2023-10-28 NOTE — Progress Notes (Signed)
Subjective:     Patient ID: Eric Simmons., male   DOB: Jul 07, 1941, 82 y.o.   MRN: [id number]  This visit type was conducted due to national recommendations for restrictions regarding the COVID-19 Pandemic (e.g. social distancing) in an effort to limit this patient's exposure and mitigate transmission in our community.  Due to their co-morbid illnesses, this patient is at least at moderate risk for complications without adequate follow up.  This format is felt to be most appropriate for this patient at this time.    Documentation for virtual audio and video telecommunications through [place] encounter:  The patient was located at home. The provider was located in the office. The patient did consent to this visit and is aware of possible charges through their insurance for this visit.  The other persons participating in this telemedicine service were wife. Time spent on call was 20 minutes and in review of previous records 20 minutes total.  This virtual service is not related to other E/M service within previous 7 days.   HPI Chief Complaint  Patient presents with   Cough    Cough, not feeling bad. symptoms started 2 days ago. Tested positive this morning for Flu A with at home test.    Virtual consult for this.  Tested positive for flu with a home test this morning.  Wife was diagnosed with flu last week and is felt horrible  He has symptoms for the past 2 days.  The most notable symptom is a lot of cough, terrible cough at times.  He does not feel well, and sometimes has some chills and shakes.  Mild sore throat.  No shortness of breath or wheezing.  No nausea or vomiting.  Water intake is good.  Using Tylenol for symptoms and some over-the-counter cough syrup  No other aggravating or relieving factors. No other complaint.  Past Medical History:  Diagnosis Date   Adenomatous colon polyp 03/2005   tubular adenoma   Aortic atherosclerosis    BCC (basal cell carcinoma),  face    left face, R ear (04/2014), nose 05/2016   Benign familial tremor 08/29/2012   Coronary artery disease involving native coronary artery of native heart with angina pectoris    Lipid rich plaque; cardiac cath 01/12/2022 which showed 99% oRCA, 85% mRCA and 60% pLAD with L>R collaterals. Medical management was recommended.   Dysphagia 12/28/2015   Erectile dysfunction    Essential hypertension    Hemorrhoids    internal and external   IFG (impaired fasting glucose) 12/10/2012   Glucose 111 11/2012    Insomnia    Mild cognitive impairment due to Alzheimer's disease 09/11/2019   Mild mitral regurgitation    Primary osteoarthritis of left hip 04/18/2021   Pseudogout of knee 07/2003   left   Pulmonary nodule    Pure hypercholesterolemia    Syncope and collapse    Current Outpatient Medications on File Prior to Visit  Medication Sig Dispense Refill   amLODipine (NORVASC) 5 MG tablet TAKE ONE TABLET BY MOUTH DAILY AT NOON 90 tablet 3   aspirin EC 81 MG tablet Take 1 tablet (81 mg total) by mouth in the morning. Swallow whole. 90 tablet 3   Cholecalciferol (VITAMIN D3) 50 MCG (2000 UT) TABS Take 1 tablet by mouth daily.     Dextromethorphan-guaiFENesin (ROBITUSSIN COUGH/CHEST DM MAX PO) Take 30 mLs by mouth as needed.     ezetimibe (ZETIA) 10 MG tablet Take 1 tablet (10 mg total) by  mouth daily. 90 tablet 3   isosorbide mononitrate (IMDUR) 30 MG 24 hr tablet TAKE ONE TABLET EVERY MORNING AND ONE-HALF TABLET EVERY NIGHT 180 tablet 3   losartan (COZAAR) 25 MG tablet Take 1 tablet (25 mg total) by mouth daily. 90 tablet 3   memantine (NAMENDA) 10 MG tablet Take 1 tablet (10 mg total) by mouth at bedtime. 30 tablet 11   metoprolol succinate (TOPROL-XL) 25 MG 24 hr tablet take ONE-HALF TABLET BY MOUTH DAILY 45 tablet 2   primidone (MYSOLINE) 50 MG tablet TAKE ONE TABLET AT BEDTIME 90 tablet 0   Propylene Glycol (SYSTANE COMPLETE) 0.6 % SOLN Place 1 drop into both eyes daily as needed (dry  eyes).     rosuvastatin (CRESTOR) 20 MG tablet TAKE ONE TABLET BY MOUTH ONCE DAILY 90 tablet 3   acetaminophen (TYLENOL) 650 MG CR tablet Take 650 mg by mouth every 8 (eight) hours as needed for pain.     Lidocaine-Glycerin (PREPARATION H EX) Place 1 application  rectally daily as needed (irritation). (Patient not taking: Reported on 10/28/2023)     nitroGLYCERIN (NITROSTAT) 0.4 MG SL tablet PLACE ONE TABLET UNDER THE TONGUE EVERY FIVE MINUTES AS NEEDED FOR CHEST PAIN (Patient not taking: Reported on 08/05/2023) 30 tablet 7   No current facility-administered medications on file prior to visit.    Review of Systems As in subjective    Objective:   Physical Exam Due to coronavirus pandemic stay at home measures, patient visit was virtual and they were not examined in person.   BP 118/65   Pulse 67   Wt 175 lb (79.4 kg)   BMI 23.73 kg/m   Gen: wd, wn, nad No labored breathing     Assessment:     Encounter Diagnoses  Name Primary?   Influenza Yes   Acute cough        Plan:     Prescription given for Tamiflu, Tessalon Perles, and discussed risks/benefits of medication.    Of note, wife sick with flu as well. She was seen at our office last week for same.  Discussed diagnosis of influenza. Discussed supportive care including rest, hydration, OTC Tylenol for fever, aches, and malaise.  Discussed period of contagion, self quarantine at home away from others to avoid spread of disease, discussed means of transmission, and possible complications including pneumonia.  If worse or not improving within the next 4-5 days, then call or return.   Eric Simmons was seen today for cough.  Diagnoses and all orders for this visit:  Influenza  Acute cough  Other orders -     oseltamivir (TAMIFLU) 75 MG capsule; Take 1 capsule (75 mg total) by mouth 2 (two) times daily. -     benzonatate (TESSALON) 200 MG capsule; Take 1 capsule (200 mg total) by mouth 3 (three) times daily as needed for  cough.  F/u prn

## 2023-11-01 ENCOUNTER — Other Ambulatory Visit: Payer: Self-pay | Admitting: Medical

## 2023-11-01 ENCOUNTER — Telehealth: Payer: Self-pay | Admitting: Family Medicine

## 2023-11-01 ENCOUNTER — Ambulatory Visit
Admission: RE | Admit: 2023-11-01 | Discharge: 2023-11-01 | Disposition: A | Discharge status: Home / Self Care | Payer: PPO | Source: Ambulatory Visit | Attending: Medical | Admitting: Medical

## 2023-11-01 DIAGNOSIS — J988 Other specified respiratory disorders: Secondary | ICD-10-CM

## 2023-11-01 DIAGNOSIS — R0989 Other specified symptoms and signs involving the circulatory and respiratory systems: Secondary | ICD-10-CM | POA: Diagnosis not present

## 2023-11-01 DIAGNOSIS — R051 Acute cough: Secondary | ICD-10-CM

## 2023-11-01 DIAGNOSIS — R059 Cough, unspecified: Secondary | ICD-10-CM | POA: Diagnosis not present

## 2023-11-01 MED ORDER — PROMETHAZINE-DM 6.25-15 MG/5ML PO SYRP
5.0000 mL | ORAL_SOLUTION | Freq: Four times a day (QID) | ORAL | 0 refills | Status: DC | PRN
Start: 2023-11-01 — End: 2023-11-21

## 2023-11-01 NOTE — Telephone Encounter (Signed)
 Pt advised to go for chest xray

## 2023-11-01 NOTE — Telephone Encounter (Signed)
 Wife called, pt is not really any better from Monday. He has a lot of chest cong, productive cough Slight yellowish,  you mentioned doing chest xray and they would like to do that  Please advise

## 2023-11-01 NOTE — Progress Notes (Signed)
 Chest x-ray thankfully shows no pneumonia.  I did send a different cough medicine Promethazine DM if he wants to try this the Jerilynn Som are not helping as much.  Continue with rest and hydration, Tylenol as needed for pain.

## 2023-11-20 NOTE — Progress Notes (Unsigned)
No chief complaint on file.  Patient presents for 6 month follow-up on chronic problems.  Since his physical in July, he had COVID in October, and influenza 10/28/23.   HTN, CAD, HLD: He had elevated calcium score (2461, 84%ile), and subsequently underwent cardiac cath (12/2021, 99% Ost RCA stenosis, 85% midRCA, 60% prox LAD with L>R collaterals).  Doing well on medical management. He remains under the care of Dr. Mayford Knife, last seen in 08/2023. Losartan dose was decreased to 25 mg at that visit, no other changes made. Decrease due to some soft BP's. He continues on regimen of aspirin 81 mg daily, amlodipine 5 mg daily, Imdur 30 mg every morning and 15 mg every afternoon, Toprol-XL 12.5 mg daily and Crestor 20 mg daily Hasn't needed any nitroglycerin.  Denies DOE, shortness of breath, chest pain palpitations, edema.    He walks 2 miles on the track at the gym at least 4 days/week.   HTN: He tries to follow low sodium diet.  Compliant with medications. Losartan dose decreased in November by Dr. Mayford Knife due to lower BP reading (100/60). BP's are only occasionally checked at home, not recently.   ***   BP Readings from Last 3 Encounters:  10/28/23 118/65  09/05/23 100/60  08/05/23 (!) 174/94    Hyperlipidemia and aortic atherosclerosis:   He is on Crestor, and zetia (added in 01/2022 to get LDL <55).  He denies side effects.  LDL at goal on last check.  LFT's were elevated in May, normal on recheck in July 2024. Lab Results  Component Value Date   CHOL 113 03/05/2023   HDL 47 03/05/2023   LDLCALC 53 03/05/2023   TRIG 61 03/05/2023   CHOLHDL 2.4 03/05/2023   Lab Results  Component Value Date   ALT 34 05/16/2023   AST 33 05/16/2023   ALKPHOS 88 05/16/2023   BILITOT 0.4 05/16/2023   Mild Cognitive Impairment due to Alzheimer's disease. All of the questions were answered. He is on memantine 10 mg daily, tolerating well.    He is under the care of [facility] Marlowe Kays, [place]) for  mild cognitive impairment due to Alzheimer's disease, essential tremor and hand paresthesias.  Last seen in November 2024, and doing well on memantine 10mg  daily, along with primidone for tremor.     Impaired fasting glucose.  Admits to some sweets, multiple times/day, but small portions.  Has 3 spoonfuls of ice cream before bed nightly. 1/2 cookie at lunch.  Mainly has cookies and ice cream. Drinks sugar-free/caffeine-free Coke, part of a can. He has Marianne Sofia with dinner--thinks he gets the "light" version.  A1c was up to 6.3% in July 2024 (5.8% the year prior). No polydipsia, polyuria (just nocturia).  Component Ref Range & Units (hover) 6 mo ago (05/16/23) 1 yr ago (05/03/22) 2 yr ago (03/29/21) 3 yr ago (07/06/20) 3 yr ago (12/09/19) 5 yr ago (11/11/18) 5 yr ago (05/08/18)  Hemoglobin A1C 6.3 Abnormal  5.8 Abnormal  6.0 High  R, CM 6.0 Abnormal  6.0 High  R, CM 5.9 High  R, CM 6.1 Abnormal     BPH and ED:  He has been under the care of Dr. Retta Diones. Previously took tamsulosin, which was stopped due to dizziness and changed to finasteride 5mg . He subsequently stopped taking finasteride as well, and didn't really notice worsening of symptoms. He gets up once or twice a night to void. He previously was prescribed sildenafil 100mg  prn for ED. It stopped being effective, no  longer needed (no longer having sexual relations).  Last seen by Dr. Retta Diones in 11/2022.  Was told that he didn't need yearly f/u, but pt prefers to continue. Will see Dr. Jennette Bill next year (Dr. Retta Diones retired).   PMH, PSH, SH reviewed   ROS: no fever, chills, headaches, dizziness. No exertional chest pain, DOE, edema. No nausea, vomiting, bowel changes,. No bleeding, bruising, rashes. Moods are good. Tremor and memory are stable. No weight changes or other concerns.   PHYSICAL EXAM:  There were no vitals taken for this visit.  Wt Readings from Last 3 Encounters:  10/28/23 175 lb (79.4 kg)  09/05/23  175 lb 3.2 oz (79.5 kg)  08/05/23 176 lb (79.8 kg)   Well-appearing, pleasant, elderly male in no distress HEENT: conjunctiva and sclera are clear, EOMI, OP clear Neck: no lymphadenopathy, thyromegaly or carotid bruit Heart: regular rate and rhythm Lungs: clear bilaterally Back: no spinal or CVA tenderness Abdomen: soft, nontender, no mass Extremities: no edema Psych: normal mood, affect, hygiene and grooming Neuro: alert and oriented.  Tremor? Mild intentional tremor bilaterally   ASSESSMENT/PLAN:   POC A1c today  AWV/CPE 6 mos, fasting (unless lipids being done prior by cardiologist, then doesn't have to fast)

## 2023-11-21 ENCOUNTER — Ambulatory Visit: Payer: PPO | Admitting: Family Medicine

## 2023-11-21 ENCOUNTER — Encounter: Payer: Self-pay | Admitting: Family Medicine

## 2023-11-21 VITALS — BP 102/62 | HR 67 | Ht 72.0 in | Wt 176.0 lb

## 2023-11-21 DIAGNOSIS — I25119 Atherosclerotic heart disease of native coronary artery with unspecified angina pectoris: Secondary | ICD-10-CM | POA: Diagnosis not present

## 2023-11-21 DIAGNOSIS — R351 Nocturia: Secondary | ICD-10-CM | POA: Diagnosis not present

## 2023-11-21 DIAGNOSIS — G309 Alzheimer's disease, unspecified: Secondary | ICD-10-CM | POA: Diagnosis not present

## 2023-11-21 DIAGNOSIS — I10 Essential (primary) hypertension: Secondary | ICD-10-CM | POA: Diagnosis not present

## 2023-11-21 DIAGNOSIS — E7800 Pure hypercholesterolemia, unspecified: Secondary | ICD-10-CM | POA: Diagnosis not present

## 2023-11-21 DIAGNOSIS — R7301 Impaired fasting glucose: Secondary | ICD-10-CM | POA: Diagnosis not present

## 2023-11-21 DIAGNOSIS — G250 Essential tremor: Secondary | ICD-10-CM | POA: Diagnosis not present

## 2023-11-21 DIAGNOSIS — N401 Enlarged prostate with lower urinary tract symptoms: Secondary | ICD-10-CM | POA: Insufficient documentation

## 2023-11-21 DIAGNOSIS — G3184 Mild cognitive impairment, so stated: Secondary | ICD-10-CM | POA: Diagnosis not present

## 2023-11-21 LAB — POCT GLYCOSYLATED HEMOGLOBIN (HGB A1C): HbA1c, POC (controlled diabetic range): 6.1 % (ref 0.0–7.0)

## 2023-11-21 NOTE — Assessment & Plan Note (Signed)
Controlled with metoprolol, amlodipine and losartan.  Losartan dose lowered to 25mg  at last cardiology visit. BP still a little low today, but asymptomatic. Cont low Na diet, regular exercise.

## 2023-11-21 NOTE — Assessment & Plan Note (Signed)
Mild, doing well on primidone

## 2023-11-21 NOTE — Assessment & Plan Note (Signed)
Under th care of Dr. Mayford Knife. On medical management with aspirin, imdur, beta blocker, statin/zetia. No angina on current regimen, hasn't needed NTG.

## 2023-11-21 NOTE — Patient Instructions (Addendum)
Try and increase your water intake, and drink less of other beverages (6 glasses/day--the earlier you drink the fluids, the less they will interfere with your sleep with frequent bathroom trips).  Your A1c remains in the pre-diabetic range, but is somewhat better than last check. Continue to try and limit your sweets and sugar in your diet.  If you choose to follow-up with the urologist, you will be due for your yearly visit in February.

## 2023-11-21 NOTE — Assessment & Plan Note (Signed)
Stable on Namenda per neuro

## 2023-11-21 NOTE — Assessment & Plan Note (Signed)
Only up 1-2x/night to void. Emptying well, and overall doing well without medications.  Prefers to continue to see urologist yearly, due in February.

## 2023-11-21 NOTE — Assessment & Plan Note (Signed)
LDL at goal <55 on rosuvastatin and zetia. Reviewed low cholesterol diet

## 2023-11-21 NOTE — Assessment & Plan Note (Signed)
Reviewed proper diet, daily exercise and healthy weight. A1c slightly lower than last check

## 2023-11-23 ENCOUNTER — Other Ambulatory Visit: Payer: Self-pay | Admitting: Physician Assistant

## 2023-11-23 DIAGNOSIS — G250 Essential tremor: Secondary | ICD-10-CM

## 2023-11-25 ENCOUNTER — Other Ambulatory Visit: Payer: Self-pay

## 2023-11-25 DIAGNOSIS — G250 Essential tremor: Secondary | ICD-10-CM

## 2023-11-27 ENCOUNTER — Other Ambulatory Visit: Payer: Self-pay

## 2023-11-27 DIAGNOSIS — G250 Essential tremor: Secondary | ICD-10-CM

## 2023-11-27 MED ORDER — PRIMIDONE 50 MG PO TABS: 50.0000 mg | ORAL_TABLET | Freq: Every day | ORAL | 0 refills | Status: DC

## 2024-01-03 DIAGNOSIS — N401 Enlarged prostate with lower urinary tract symptoms: Secondary | ICD-10-CM | POA: Diagnosis not present

## 2024-01-03 DIAGNOSIS — R3914 Feeling of incomplete bladder emptying: Secondary | ICD-10-CM | POA: Diagnosis not present

## 2024-01-03 DIAGNOSIS — R351 Nocturia: Secondary | ICD-10-CM | POA: Diagnosis not present

## 2024-01-06 ENCOUNTER — Ambulatory Visit: Payer: PPO | Admitting: Physician Assistant

## 2024-02-21 DIAGNOSIS — D3132 Benign neoplasm of left choroid: Secondary | ICD-10-CM | POA: Diagnosis not present

## 2024-02-21 DIAGNOSIS — H2513 Age-related nuclear cataract, bilateral: Secondary | ICD-10-CM | POA: Diagnosis not present

## 2024-02-21 DIAGNOSIS — H524 Presbyopia: Secondary | ICD-10-CM | POA: Diagnosis not present

## 2024-03-02 ENCOUNTER — Other Ambulatory Visit: Payer: Self-pay | Admitting: Cardiology

## 2024-03-03 DIAGNOSIS — L570 Actinic keratosis: Secondary | ICD-10-CM | POA: Diagnosis not present

## 2024-03-03 DIAGNOSIS — L821 Other seborrheic keratosis: Secondary | ICD-10-CM | POA: Diagnosis not present

## 2024-03-03 DIAGNOSIS — Z85828 Personal history of other malignant neoplasm of skin: Secondary | ICD-10-CM | POA: Diagnosis not present

## 2024-03-03 DIAGNOSIS — L820 Inflamed seborrheic keratosis: Secondary | ICD-10-CM | POA: Diagnosis not present

## 2024-03-03 DIAGNOSIS — D1801 Hemangioma of skin and subcutaneous tissue: Secondary | ICD-10-CM | POA: Diagnosis not present

## 2024-03-03 DIAGNOSIS — L812 Freckles: Secondary | ICD-10-CM | POA: Diagnosis not present

## 2024-03-06 ENCOUNTER — Encounter: Payer: Self-pay | Admitting: Cardiology

## 2024-03-06 ENCOUNTER — Ambulatory Visit: Attending: Cardiology | Admitting: Cardiology

## 2024-03-06 VITALS — BP 122/66 | HR 61 | Ht 72.0 in | Wt 177.2 lb

## 2024-03-06 DIAGNOSIS — I2510 Atherosclerotic heart disease of native coronary artery without angina pectoris: Secondary | ICD-10-CM

## 2024-03-06 DIAGNOSIS — E7800 Pure hypercholesterolemia, unspecified: Secondary | ICD-10-CM | POA: Diagnosis not present

## 2024-03-06 DIAGNOSIS — I10 Essential (primary) hypertension: Secondary | ICD-10-CM | POA: Diagnosis not present

## 2024-03-06 DIAGNOSIS — I25118 Atherosclerotic heart disease of native coronary artery with other forms of angina pectoris: Secondary | ICD-10-CM

## 2024-03-06 DIAGNOSIS — I2583 Coronary atherosclerosis due to lipid rich plaque: Secondary | ICD-10-CM

## 2024-03-06 DIAGNOSIS — I2089 Other forms of angina pectoris: Secondary | ICD-10-CM

## 2024-03-06 NOTE — Patient Instructions (Signed)
 Medication Instructions:  No changes *If you need a refill on your cardiac medications before your next appointment, please call your pharmacy*  Lab Work: Please return for fasting blood work (lipids, alt)  If you have labs (blood work) drawn today and your tests are completely normal, you will receive your results only by: MyChart Message (if you have MyChart) OR A paper copy in the mail If you have any lab test that is abnormal or we need to change your treatment, we will call you to review the results.  Testing/Procedures: none  Follow-Up: At [facility], you and your health needs are our priority.  As part of our continuing mission to provide you with exceptional heart care, our providers are all part of one team.  This team includes your primary Cardiologist (physician) and Advanced Practice Providers or APPs (Physician Assistants and Nurse Practitioners) who all work together to provide you with the care you need, when you need it.  Your next appointment:   6 month(s)  Provider:   Gaylyn Keas, MD

## 2024-03-06 NOTE — Addendum Note (Signed)
 Addended by: Corbin Falck on: 03/06/2024 11:43 AM   Modules accepted: Orders

## 2024-03-06 NOTE — Progress Notes (Signed)
 Cardiology Office Note    Date:  03/06/2024   ID:  Eric Simmons., DOB 1941/05/22, MRN [id number]  PCP:  Roosvelt Colla, MD  Cardiologist:  Gaylyn Keas, MD   Chief Complaint  Patient presents with   Coronary Artery Disease   Hypertension   Hyperlipidemia    History of Present Illness:  Eric Simmons. is a 83 y.o. male with a hx of HLD and ASCAD with cath 01/12/2022 showing 99% oRCA, 85% mRCA and 60% pLAD with L>R collaterals.  Medical management was recommended.    He is here today for followup and is doing well.  He denies any chest pain or pressure, SOB, DOE (except when working hard in the yard in the heat), PND, orthopnea, LE edema, dizziness, palpitations or syncope. He is compliant with his meds and is tolerating meds with no SE.    Past Medical History:  Diagnosis Date   Adenomatous colon polyp 03/2005   tubular adenoma   Aortic atherosclerosis    BCC (basal cell carcinoma), face    left face, R ear (04/2014), nose 05/2016   Benign familial tremor 08/29/2012   Coronary artery disease involving native coronary artery of native heart with angina pectoris    Lipid rich plaque; cardiac cath 01/12/2022 which showed 99% oRCA, 85% mRCA and 60% pLAD with L>R collaterals. Medical management was recommended.   Dysphagia 12/28/2015   Erectile dysfunction    Essential hypertension    Hemorrhoids    internal and external   IFG (impaired fasting glucose) 12/10/2012   Glucose 111 11/2012    Insomnia    Mild cognitive impairment due to Alzheimer's disease 09/11/2019   Mild mitral regurgitation    Primary osteoarthritis of left hip 04/18/2021   Pseudogout of knee 07/2003   left   Pulmonary nodule    Pure hypercholesterolemia    Syncope and collapse     Past Surgical History:  Procedure Laterality Date   COLONOSCOPY  6/06, 05/2010   Dr. Willy Harvest   CORONARY PRESSURE/FFR STUDY N/A 01/12/2022   Procedure: INTRAVASCULAR PRESSURE WIRE/FFR STUDY;  Surgeon: Kyra Phy, MD;   Location: [facility];  Service: Cardiovascular;  Laterality: N/A;   LEFT HEART CATH AND CORONARY ANGIOGRAPHY N/A 01/12/2022   Procedure: LEFT HEART CATH AND CORONARY ANGIOGRAPHY;  Surgeon: Kyra Phy, MD;  Location: [facility];  Service: Cardiovascular;  Laterality: N/A;   MOHS SURGERY  6/07   L face, Dr. Roel Clarity   POLYPECTOMY     TONSILLECTOMY     TOTAL HIP ARTHROPLASTY Left 04/18/2021   Procedure: LEFT TOTAL HIP ARTHROPLASTY ANTERIOR APPROACH;  Surgeon: Dayne Even, MD;  Location: [facility];  Service: Orthopedics;  Laterality: Left;   VASECTOMY      Current Medications: Current Meds  Medication Sig   acetaminophen  (TYLENOL ) 650 MG CR tablet Take 650 mg by mouth every 8 (eight) hours as needed for pain.   amLODipine  (NORVASC ) 5 MG tablet TAKE ONE TABLET BY MOUTH DAILY AT NOON   aspirin  EC 81 MG tablet Take 1 tablet (81 mg total) by mouth in the morning. Swallow whole.   Cholecalciferol (VITAMIN D3) 50 MCG (2000 UT) TABS Take 1 tablet by mouth daily.   ezetimibe  (ZETIA ) 10 MG tablet TAKE ONE TABLET BY MOUTH DAILY   isosorbide  mononitrate (IMDUR ) 30 MG 24 hr tablet TAKE ONE TABLET EVERY MORNING AND ONE-HALF TABLET EVERY NIGHT   memantine  (NAMENDA ) 10 MG tablet Take 1 tablet (10  mg total) by mouth at bedtime.   metoprolol  succinate (TOPROL -XL) 25 MG 24 hr tablet take ONE-HALF TABLET BY MOUTH DAILY   primidone  (MYSOLINE ) 50 MG tablet Take 1 tablet (50 mg total) by mouth at bedtime.   Propylene Glycol (SYSTANE COMPLETE) 0.6 % SOLN Place 1 drop into both eyes daily as needed (dry eyes).   rosuvastatin  (CRESTOR ) 20 MG tablet TAKE ONE TABLET BY MOUTH ONCE DAILY    Allergies:   Codeine   Social History   Socioeconomic History   Marital status: Married    Spouse name: Not on file   Number of children: 2   Years of education: 16   Highest education level: Bachelor's degree (e.g., BA, AB, BS)  Occupational History   Occupation: Retired  Tobacco Use   Smoking  status: Former    Types: Cigars   Smokeless tobacco: Never   Tobacco comments:    as a teenager  Vaping Use   Vaping status: Never Used  Substance and Sexual Activity   Alcohol use: Not Currently    Comment: rare non-alcoholic beer   Drug use: No   Sexual activity: Not Currently  Other Topics Concern   Not on file  Social History Narrative   Widowed 2013 (after wife's long battle with MS).  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  Children live in [place], [place] and [place] . 4 grandchildren. Sudie has 9      Married March 2016 (Sudie)   Right handed   Drinks caffeine--now mostly decaffeinated   Two story home      Updated 04/2023   Social Drivers of Health   Financial Resource Strain: Not on file  Food Insecurity: Not on file  Transportation Needs: Not on file  Physical Activity: Not on file  Stress: Not on file  Social Connections: Not on file     Family History:  The patient's family history includes Cancer (age of onset: 80) in his mother; Colon cancer in his mother; Colon polyps in his mother; Diabetes in his mother; Heart disease in his brother, father, and son; Hyperlipidemia in his brother, brother, brother, son, and son; Hypertension in his father; Sleep apnea in his son; Syncope episode in his son; Tremor in his brother and brother.   ROS:   Please see the history of present illness.    ROS All other systems reviewed and are negative.      No data to display             PHYSICAL EXAM:   VS:  BP 122/66   Pulse 61   Ht 6' (1.829 m)   Wt 177 lb 3.2 oz (80.4 kg)   SpO2 96%   BMI 24.03 kg/m    GEN: Well nourished, well developed in no acute distress HEENT: Normal NECK: No JVD; No carotid bruits LYMPHATICS: No lymphadenopathy CARDIAC:RRR, no murmurs, rubs, gallops RESPIRATORY:  Clear to  auscultation without rales, wheezing or rhonchi  ABDOMEN: Soft, non-tender, non-distended MUSCULOSKELETAL:  No edema; No deformity  SKIN: Warm and dry NEUROLOGIC:  Alert and oriented x 3 PSYCHIATRIC:  Normal affect  Wt Readings from Last 3 Encounters:  03/06/24 177 lb 3.2 oz (80.4 kg)  11/21/23 176 lb (79.8 kg)  10/28/23 175 lb (79.4 kg)      Studies/Labs Reviewed:  Recent Labs: 05/16/2023: ALT 34; BUN 24; Creatinine, Ser 1.18; Hemoglobin 14.3; Platelets 213; Potassium 4.3; Sodium 139   Lipid Panel    Component Value Date/Time   CHOL 113 03/05/2023 0840   TRIG 61 03/05/2023 0840   HDL 47 03/05/2023 0840   CHOLHDL 2.4 03/05/2023 0840   CHOLHDL 2.9 10/30/2017 0944   VLDL 17 08/20/2016 1051   LDLCALC 53 03/05/2023 0840   LDLCALC 80 10/30/2017 0944    Additional studies/ records that were reviewed today include:  2D echo 03/2020 IMPRESSIONS     1. Left ventricular ejection fraction, by estimation, is 60 to 65%. The  left ventricle has normal function. The left ventricle has no regional  wall motion abnormalities. Left ventricular diastolic parameters are  indeterminate.   2. Right ventricular systolic function is normal. The right ventricular  size is normal. There is normal pulmonary artery systolic pressure. The  estimated right ventricular systolic pressure is 16.5 mmHg.   3. The mitral valve is grossly normal. Mild mitral valve regurgitation.  No evidence of mitral stenosis.   4. The aortic valve is tricuspid. Aortic valve regurgitation is not  visualized. Mild aortic valve sclerosis is present, with no evidence of  aortic valve stenosis.   5. The inferior vena cava is normal in size with greater than 50%  respiratory variability, suggesting right atrial pressure of 3 mmHg.   Cardiac Cath 03/2020 Study Highlights    Nuclear stress EF: 56%. There was no ST segment deviation noted during stress. No T wave inversion was noted during stress. The study is  normal. This is a low risk study. The left ventricular ejection fraction is normal (55-65%).   1. There are reduced counts in the mid inferior segment on rest and stress imaging consistent with diaphragm attenuation.  2. This is a normal study without evidence of ischemia or infarction.  3. Normal LVEF, 56%.  4. This is a low-risk study.     ASSESSMENT:    1. Coronary artery disease due to lipid rich plaque   2. Chronic stable angina (HCC)   3. Primary hypertension   4. Pure hypercholesterolemia        PLAN:  In order of problems listed above:  1.  ASCAD with chronic stable angina -nuclear stress test showed no ischemia in 03/2020 -2D echo showed normal LVF in June 2021 -coronary CTA showed 70-99% oRCA, 50-69% RCA crux, 50%dLM, 70-99% oLAD, 50-69% OM1 -cardiac cath 01/12/2022 which showed 99% oRCA, 85% mRCA and 60% pLAD with L>R collaterals.  Medical management was recommended.  - He denies any anginal symptoms since I saw him 6 months ago - Continue aspirin  81 mg daily, Imdur  30 mg qam and 15mg  qpm, amlodipine  5 mg daily, Toprol  XL 12.5 mg daily and Crestor  20 mg daily with as needed refills  2.  HTN - BP controlled on exam today - Continue amlodipine  5 mg daily, Imdur  30 mg qam and 15mg  qpm, losartan  25 mg daily, Toprol -XL 12.5 mg daily with as needed refills -I have personally reviewed and interpreted outside labs performed by patient's PCP which showed SCr 1.1 and K+ 4.5 on 01/03/2024  3.  HLD -LDL goal < 50 -check FLP and ALT -Continue Zetia  10 mg daily and Crestor  20 mg daily with as needed refills  Followup  in 6 months   Medication Adjustments/Labs and Tests Ordered: Current medicines are reviewed at length with the patient today.  Concerns regarding medicines are outlined above.  Medication changes, Labs  and Tests ordered today are listed in the Patient Instructions below.  There are no Patient Instructions on file for this visit.   Signed, Gaylyn Keas, MD   03/06/2024 11:28 AM    [facility] [address] [place], [place], [place]  [address] Phone: [phone]; Fax: [phone]

## 2024-03-09 DIAGNOSIS — E7800 Pure hypercholesterolemia, unspecified: Secondary | ICD-10-CM | POA: Diagnosis not present

## 2024-03-10 ENCOUNTER — Ambulatory Visit: Payer: Self-pay | Admitting: Cardiology

## 2024-03-10 LAB — ALT: ALT: 37 IU/L (ref 0–44)

## 2024-03-10 LAB — LIPID PANEL
Chol/HDL Ratio: 2.4 ratio (ref 0.0–5.0)
Cholesterol, Total: 107 mg/dL (ref 100–199)
HDL: 44 mg/dL (ref >39)
LDL Chol Calc (NIH): 49 mg/dL (ref 0–99)
Triglycerides: 67 mg/dL (ref 0–149)
VLDL Cholesterol Cal: 14 mg/dL (ref 5–40)

## 2024-03-19 ENCOUNTER — Ambulatory Visit
Admission: RE | Admit: 2024-03-19 | Discharge: 2024-03-19 | Disposition: A | Discharge status: Home / Self Care | Source: Ambulatory Visit | Attending: Medical | Admitting: Medical

## 2024-03-19 ENCOUNTER — Encounter: Payer: Self-pay | Admitting: Medical

## 2024-03-19 ENCOUNTER — Ambulatory Visit: Payer: PPO | Admitting: Physician Assistant

## 2024-03-19 ENCOUNTER — Ambulatory Visit: Payer: Self-pay | Admitting: *Deleted

## 2024-03-19 ENCOUNTER — Ambulatory Visit (INDEPENDENT_AMBULATORY_CARE_PROVIDER_SITE_OTHER): Admitting: Medical

## 2024-03-19 VITALS — BP 120/74 | HR 56 | Wt 178.0 lb

## 2024-03-19 VITALS — Ht 72.0 in

## 2024-03-19 DIAGNOSIS — R936 Abnormal findings on diagnostic imaging of limbs: Secondary | ICD-10-CM | POA: Diagnosis not present

## 2024-03-19 DIAGNOSIS — S6991XA Unspecified injury of right wrist, hand and finger(s), initial encounter: Secondary | ICD-10-CM

## 2024-03-19 DIAGNOSIS — G250 Essential tremor: Secondary | ICD-10-CM | POA: Diagnosis not present

## 2024-03-19 DIAGNOSIS — G309 Alzheimer's disease, unspecified: Secondary | ICD-10-CM | POA: Diagnosis not present

## 2024-03-19 DIAGNOSIS — S80211A Abrasion, right knee, initial encounter: Secondary | ICD-10-CM | POA: Diagnosis not present

## 2024-03-19 DIAGNOSIS — G3184 Mild cognitive impairment, so stated: Secondary | ICD-10-CM

## 2024-03-19 DIAGNOSIS — M79644 Pain in right finger(s): Secondary | ICD-10-CM | POA: Diagnosis not present

## 2024-03-19 DIAGNOSIS — S60519A Abrasion of unspecified hand, initial encounter: Secondary | ICD-10-CM

## 2024-03-19 MED ORDER — MEMANTINE HCL 10 MG PO TABS: 10.0000 mg | ORAL_TABLET | Freq: Two times a day (BID) | ORAL | 3 refills | Status: AC

## 2024-03-19 MED ORDER — PRIMIDONE 50 MG PO TABS: ORAL_TABLET | ORAL | 3 refills | Status: AC

## 2024-03-19 MED ORDER — PRIMIDONE 50 MG PO TABS: 50.0000 mg | ORAL_TABLET | Freq: Every day | ORAL | 3 refills | Status: DC

## 2024-03-19 NOTE — Patient Instructions (Addendum)
 Please go to [facility] for your right middle finger xray.   Their hours are 8am - 4:30 pm Monday - Friday.  Take your insurance card with you.  [facility] [phone]  [address] [place], [place] [address]    Recommendations: Over the weekend use cold therapy such as bag of frozen peas or ice water.  Use cold therapy 20 minutes at a time.  Put a cloth between your skin and the cold therapy. You can use over-the-counter Tylenol  every 8 hours over the weekend for pain if needed Keep the wounds clean with soap and water Use OTC antibiotic ointment to the wounds and abrasions daily through the weekend You may consider using Aleve  over the counter daily through the weekend short term for pain and inflammation of the finger Rest the finger and use the splint this weekend We will call with results

## 2024-03-19 NOTE — Progress Notes (Signed)
 Subjective:  Eric Prust. is a 83 y.o. male who presents for Chief Complaint  Patient presents with   Acute Visit    Fall this morning, Tripped over the sideway, hurt middle finger on the hand. He had wrapped it up. Hit knees and hands, did not hit head      Here for fall.  Was walking this morning, fell at the point concrete on the sidewalk switches to gravel.  He ended up falling with the different texture took him by surprise.  Fell onto both outstretched palms.  Fell somewhat on knees as well.  Knees hit in grass.  Hands fell in the gravel.  Has abrasions of both palms and right knee.   No head injury, no LOC.    No palpations, no confusion, no lightheaded.   Wife was with him and helped him get back to the house.    Cleaned up the wounds at home and put a homemade splint on the right middle finger.    Finger is sore .  no numbness or tingling .  No other aggravating or relieving factors.    No other c/o.  Past Medical History:  Diagnosis Date   Adenomatous colon polyp 03/2005   tubular adenoma   Aortic atherosclerosis    BCC (basal cell carcinoma), face    left face, R ear (04/2014), nose 05/2016   Benign familial tremor 08/29/2012   Coronary artery disease involving native coronary artery of native heart with angina pectoris    Lipid rich plaque; cardiac cath 01/12/2022 which showed 99% oRCA, 85% mRCA and 60% pLAD with L>R collaterals. Medical management was recommended.   Dysphagia 12/28/2015   Erectile dysfunction    Essential hypertension    Hemorrhoids    internal and external   IFG (impaired fasting glucose) 12/10/2012   Glucose 111 11/2012    Insomnia    Mild cognitive impairment due to Alzheimer's disease 09/11/2019   Mild mitral regurgitation    Primary osteoarthritis of left hip 04/18/2021   Pseudogout of knee 07/2003   left   Pulmonary nodule    Pure hypercholesterolemia    Syncope and collapse    Current Outpatient Medications on File Prior to Visit   Medication Sig Dispense Refill   acetaminophen  (TYLENOL ) 650 MG CR tablet Take 650 mg by mouth every 8 (eight) hours as needed for pain.     amLODipine  (NORVASC ) 5 MG tablet TAKE ONE TABLET BY MOUTH DAILY AT NOON 90 tablet 3   aspirin  EC 81 MG tablet Take 1 tablet (81 mg total) by mouth in the morning. Swallow whole. 90 tablet 3   Cholecalciferol (VITAMIN D3) 50 MCG (2000 UT) TABS Take 1 tablet by mouth daily.     ezetimibe  (ZETIA ) 10 MG tablet TAKE ONE TABLET BY MOUTH DAILY 90 tablet 1   isosorbide  mononitrate (IMDUR ) 30 MG 24 hr tablet TAKE ONE TABLET EVERY MORNING AND ONE-HALF TABLET EVERY NIGHT 180 tablet 3   Lidocaine -Glycerin (PREPARATION H EX) Place 1 application  rectally daily as needed (irritation).     memantine  (NAMENDA ) 10 MG tablet Take 1 tablet (10 mg total) by mouth 2 (two) times daily. 180 tablet 3   metoprolol  succinate (TOPROL -XL) 25 MG 24 hr tablet take ONE-HALF TABLET BY MOUTH DAILY 45 tablet 2   nitroGLYCERIN  (NITROSTAT ) 0.4 MG SL tablet PLACE ONE TABLET UNDER THE TONGUE EVERY FIVE MINUTES AS NEEDED FOR CHEST PAIN 30 tablet 7   primidone  (MYSOLINE ) 50 MG tablet Take  1tab twice a day 180 tablet 3   Propylene Glycol (SYSTANE COMPLETE) 0.6 % SOLN Place 1 drop into both eyes daily as needed (dry eyes).     rosuvastatin  (CRESTOR ) 20 MG tablet TAKE ONE TABLET BY MOUTH ONCE DAILY 90 tablet 3   losartan  (COZAAR ) 25 MG tablet Take 1 tablet (25 mg total) by mouth daily. 90 tablet 3   No current facility-administered medications on file prior to visit.    The following portions of the patient's history were reviewed and updated as appropriate: allergies, current medications, past family history, past medical history, past social history, past surgical history and problem list.  ROS Otherwise as in subjective above    Objective: BP 120/74   Pulse (!) 56   Wt 178 lb (80.7 kg)   SpO2 96%   BMI 24.14 kg/m   General appearance: alert, no distress, well developed, well  nourished Skin: Left palm with patch of abrasion just distal to the wrist, left thumb volar surface distal phalanx with a small 3 mm x 4 mm small abrasion Right palm with small abrasion similar to the left and there is also a small skin tear approximately 4 mm x 3 mm in the same general area. Right knee with an abrasion.  None of the abrasions have any foreign bodies present.  They all look clean Right middle finger PIP swollen and tender.  Some pain with range of motion of that joint in particular.  There is a little bit of purplish coloration bruising.  Range of motion reduced to that finger in general. There is some bony arthritis of his fingers in general.  Rest of hands and wrist and fingers with seemingly normal range of motion and nontender There seems to be slight decrease in strength with resisted extension of the right middle finger.  Otherwise strength and sensation in his hands and fingers normal Arm pulses and capillary refill normal     Assessment: Encounter Diagnoses  Name Primary?   Injury of right middle finger, initial encounter Yes   Knee abrasion, right, initial encounter    Abrasion of palm of hand, unspecified laterality, initial encounter      Plan: We discussed his findings and exam.  We applied a right finger splint.  Please go to [facility] for your right middle finger xray.   Their hours are 8am - 4:30 pm Monday - Friday.  Take your insurance card with you.  [facility] [phone]  [address] [place], [place] [address]    Recommendations: Over the weekend use cold therapy such as bag of frozen peas or ice water.  Use cold therapy 20 minutes at a time.  Put a cloth between your skin and the cold therapy. You can use over-the-counter Tylenol  every 8 hours over the weekend for pain if needed Keep the wounds clean with soap and water Use OTC antibiotic ointment to the wounds and abrasions daily through the weekend You may consider  using Aleve  over the counter daily through the weekend short term for pain and inflammation of the finger Rest the finger and use the splint this weekend We will call with results    Fayez was seen today for acute visit.  Diagnoses and all orders for this visit:  Injury of right middle finger, initial encounter -     Cancel: DG Finger Middle Right -     DG Finger Middle Right  Knee abrasion, right, initial encounter  Abrasion of palm of hand, unspecified  laterality, initial encounter    Follow up: pending xray

## 2024-03-19 NOTE — Progress Notes (Signed)
 Assessment/Plan:   Mild Cognitive Impairment likely due to Alzheimer's disease   Eric Simmons. is a very pleasant 83 y.o. RH male with a history of hypertension, hyperlipidemia, anxiety, depression, ET with sialorrhea seen today in follow up for memory loss. Patient is currently on memantine  10 mg nightly, tolerating well. He has subjective memory complaints despite stable performance with MMSE 29/30.  Suspect a component of situational depression.  He is able to participate in his ADLs to his ability, but it increasingly difficult due to worsening intention tremors. Discussed increasing primidone  to 50 mg bid, he agrees to proceed  and to drive without difficulty. .      Follow up in  6 months. Continue memantine  10 mg, increase to 10 mg bid, side effects discussed He is scheduled for repeat neuropsych evaluation in Oct 2025 for diagnostic clarity and disease trajectory. Recommend good control of her cardiovascular risk factors Continue to control mood as per PCP Recommend increasing socialization, consider counseling and continue physical activity   Tremors and sialorrhea This continues to be monitored closely.  He is not a candidate for ACTH due to increased risk of confusion and falls.  In today's visit, he denies any issues with swallowing, sialorrhea is stable Increase primidone  to 50 mg bid, side effects discussed Continue physical activity  Subjective:    This patient is accompanied in the office by his wife and son who supplements the history.  Previous records as well as any outside records available were reviewed prior to todays visit. He was last seen on 08/2023    Any changes in memory since last visit? "May be a little worse".  He has some difficulty remembering recent conversations and  especially names of people that he knows, new information.  This brings significant amount of stress to him, so he is   less talkative and less social, especially at [employer].  He  likes to work on his yard, walk frequently, going to [employer] several times a week.  He does not enjoy doing crossword puzzles.  He play Scrabble with his wife.  He enjoys watching TV, especially movies. repeats oneself?  Endorsed, "maybe a little worse" Disoriented when walking into a room? Denies   Leaving objects?  May misplace things but not in unusual places  Wandering behavior?  denies   Any personality changes since last visit?  As mentioned prior, he may be withdrawn at times, especially when aware of his memory issues. Any worsening depression?:  Denies.   Hallucinations or paranoia?  Denies.   Seizures? denies    Any sleep changes?  Denies vivid dreams, REM behavior or sleepwalking   Sleep apnea?   Denies.   Any hygiene concerns? Denies.  Independent of bathing and dressing?  Endorsed  Does the patient needs help with medications?  Wife is in charge   Who is in charge of the finances?  Patient is in charge     Any changes in appetite?  Denies. Not drinking enough water     Patient have trouble swallowing? Denies. Same amount of Sialorrhea  Does the patient cook? No Any headaches?   denies   Any vision changes? Denies  Chronic back pain  denies   Ambulates with difficulty?  He walks 4 times a day, [facility].  Recent falls or head injuries? Denies.     Unilateral weakness, numbness or tingling? denies   Any tremors? controlled with primidone .  No other parkinsonian  symptoms Any anosmia?  Denies   Any incontinence of urine?  Endorsed   Any bowel dysfunction?   Denies      Patient lives with his wife  Does the patient drive?  Yes, he denies getting lost.     Initial Visit 01/02/22 The patient is seen in neurologic consultation at the request of Eric Simmons for the evaluation of memory.  The patient is accompanied by his wife who supplements the history.  The patient feels that the memory "may be a little bit worse, but not that much ".  His main frustration  is remembering people's names, this bothers him.  He still tries to monitor taking his level of cognition by attending groups to discuss a variety of topics, including politics, which is very stimulating for him.  They also discussed basketball during the season.  He may have some issues with reading comprehension, and processing which may be slower than prior.  He likes to walk about 2 miles a day, 5 days a week, he [facility] to [facility] [employer], and work on the yard.  He denies repeating himself.  He denies being disoriented when walking into her room, or leaving objects in unusual places.  He denies wandering behavior.  He continues to drive, denies feeling lost.  His mood is good, denies depression or irritability.  He sleeps well, denies vivid dreams, sleepwalking, hallucinations or paranoia.  There are no hygiene concerns.  He is independent of bathing and dressing.  His medications are in a pillbox, and the finances and are on autopay.  His appetite is good, denies trouble swallowing, but he does have sialorrhea, at times bothersome, but he does not wet the pillow as of yet.  He does not cook much.  Denies leaving the stove or the faucet on.  He denies any headaches, double vision, dizziness, focal numbness or tingling, unilateral weakness or anosmia.  No history of seizures.  Denies urine incontinence, retention, constipation or diarrhea.  Denies OSA, alcohol or tobacco.  As for his essential tremor, he reports that they are about the same as prior.  He is on primidone  50 mg nightly.  He does not want to change his dosage at this time, and his wife agrees as well.   Current prescribed movement disorder medications: Primidone , 50 mg nightly   Neurocognitive Testing 12/14/21 Eric Simmons . Briefly, results suggested impairment surrounding semantic fluency, as well as additional performance variability and/or relative weakness across processing speed and all aspects of learning and memory. Relative to Eric Simmons's past  evaluations, mild decline was seen in several domains. Most prominent was essentially all aspects of list and shape-based memory tasks, as well as delayed retrieval aspects of a story-based task. He also exhibited greater difficulty across response inhibition, while much milder declines were seen across processing speed and cognitive flexibility. Performances were generally stable across attention/concentration, response inhibition, expressive language, and visuospatial abilities. Past neuroimaging suggesting disproportionate mesial temporal lobe volume loss is a known risk factor for Alzheimer's disease, as can be the presence of superficial siderosis. Since 2020, there has been steady, documented decline across list and shape-based memory tasks, both in terms of the acquisition of information and its retention after time has passed which would also increase the risk for a memory-based neurodegenerative illness. However, story-based information has remained somewhat resilient to significant decline thus far. While consistent impairment in semantic fluency is also a common finding in Alzheimer's disease, confrontation naming has also remained resilient  to decline over the years. While the potential for a quite slowly progressing case of Alzheimer's disease remains plausible, he has yet to fully fall in line with typical patterns of impairment across testing.    Neuropsych evaluation 10/18/2024Briefly, results suggested severe impairment surrounding semantic fluency, confrontation naming, and delayed retrieval aspects of memory. An additional weakness was exhibited across encoding (i.e., learning) aspects of memory, while performance variability was exhibited across processing speed, executive functioning, and recognition/consolidation aspects of memory. Relative to his most recent evaluation in February 2023, quite significant decline was exhibited across confrontation naming. Additional more mild decline was  exhibited across encoding and retrieval aspects of verbal memory, retrieval aspects of visual memory, and response inhibition. Looking more broadly, compared to his initial evaluation in November 2020, decline across the aforementioned areas is easily observed and quite pronounced in several cases. Regarding etiology, I continue to have primary concerns surrounding underlying Alzheimer's disease. Mr. Carrington was fully amnestic (i.e., 0% retention) across both verbal tasks after a brief delay and only exhibited 50% retention (raw score of 1) across a visual task. This suggests evidence for rapid forgetting which has progressively worsened over the years, representing the hallmark testing characteristic of Alzheimer's disease. Further impairment surrounding confrontation naming and semantic fluency would follow classic disease progression patterns. 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   PREVIOUS MEDICATIONS:   CURRENT MEDICATIONS:  Outpatient Encounter Medications as of 03/19/2024  Medication Sig   acetaminophen  (TYLENOL ) 650 MG CR tablet Take 650 mg by mouth every 8 (eight) hours as needed for pain.   amLODipine  (NORVASC ) 5 MG tablet TAKE ONE TABLET BY MOUTH DAILY AT NOON   aspirin  EC 81 MG tablet Take 1 tablet (81 mg total) by mouth in the morning. Swallow whole.   Cholecalciferol (VITAMIN D3) 50 MCG (2000 UT) TABS Take 1 tablet by mouth daily.   ezetimibe  (ZETIA ) 10 MG tablet TAKE ONE TABLET BY MOUTH DAILY   isosorbide  mononitrate (IMDUR ) 30 MG 24 hr tablet TAKE ONE TABLET EVERY MORNING AND ONE-HALF TABLET EVERY NIGHT   Lidocaine -Glycerin (PREPARATION H EX) Place 1 application  rectally daily as needed (irritation).   metoprolol  succinate (TOPROL -XL) 25 MG 24 hr tablet take ONE-HALF TABLET BY MOUTH DAILY   nitroGLYCERIN  (NITROSTAT ) 0.4 MG SL tablet PLACE ONE TABLET UNDER THE TONGUE EVERY FIVE MINUTES AS  NEEDED FOR CHEST PAIN   Propylene Glycol (SYSTANE COMPLETE) 0.6 % SOLN Place 1 drop into both eyes daily as needed (dry eyes).   rosuvastatin  (CRESTOR ) 20 MG tablet TAKE ONE TABLET BY MOUTH ONCE DAILY   [DISCONTINUED] memantine  (NAMENDA ) 10 MG tablet Take 1 tablet (10 mg total) by mouth at bedtime.   [DISCONTINUED] primidone  (MYSOLINE ) 50 MG tablet Take 1 tablet (50 mg total) by mouth at bedtime.   losartan  (COZAAR ) 25 MG tablet Take 1 tablet (25 mg total) by mouth daily.   memantine  (NAMENDA ) 10 MG tablet Take 1 tablet (10 mg total) by mouth 2 (two) times daily.   primidone  (MYSOLINE ) 50 MG tablet Take 1tab twice a day   [DISCONTINUED] primidone  (MYSOLINE ) 50 MG tablet Take 1 tablet (50 mg total) by mouth at bedtime.   No facility-administered encounter medications on file as of 03/19/2024.       03/19/2024   12:00 PM 01/03/2023   12:00 PM 05/08/2018   10:18 AM  MMSE - Mini Mental State Exam  Orientation to time 5 3 5   Orientation  to time comments   July 11th or 12th  Orientation to Place 5 5 5   Registration 3 3 3   Attention/ Calculation 5 5 5   Recall 2 3 2   Language- name 2 objects 2 2 2   Language- repeat 1 1 1   Language- follow 3 step command 3 3 3   Language- read & follow direction 1 1 1   Write a sentence 1 1 1   Write a sentence-comments   significant tremor  Copy design 1 1 1   Total score 29 28 29       01/02/2022   10:00 AM 06/18/2019    1:00 PM 05/20/2018    8:55 AM  Montreal Cognitive Assessment   Visuospatial/ Executive (0/5) 5 5 5   Naming (0/3) 3 3 3   Attention: Read list of digits (0/2) 2 2 2   Attention: Read list of letters (0/1) 1 1 1   Attention: Serial 7 subtraction starting at 100 (0/3) 3 3 2   Language: Repeat phrase (0/2) 2 2 2   Language : Fluency (0/1) 1 1 1   Abstraction (0/2) 1 1 1   Delayed Recall (0/5) 0 1 0  Orientation (0/6) 5 5 6   Total 23 24 23   Adjusted Score (based on education)  24 23    Objective:     PHYSICAL EXAMINATION:    VITALS:    Vitals:   03/19/24 1119  Height: 6' (1.829 m)    GEN:  The patient appears stated age and is in NAD. HEENT:  Normocephalic, atraumatic.   Neurological examination:  General: NAD, well-groomed, appears stated age. Orientation: The patient is alert. Oriented to person, place and date Cranial nerves: There is good facial symmetry.No hypomimia.The speech is fluent and clear. No aphasia or dysarthria. Fund of knowledge is appropriate. Recent and remote memory are impaired. Attention and concentration are reduced. Able to name objects and repeat phrases.  Hearing is intact to conversational tone.  Delayed recall 2/3 Sensation: Sensation is intact to light touch throughout Motor: Strength is at least antigravity x4. DTR's 2/4 in UE/LE     Movement examination: Tone: There is normal tone in the UE/LE, no cogwheeling is noted Abnormal movements Right greater than left intention tremor from both upper extremities, similar to prior exam.No resting tremor.  No facial tremor  No myoclonus.  No asterixis.   Coordination:  There is no decremation with RAM's. Normal finger to nose  Gait and Station: The patient has no difficulty arising out of a deep-seated chair without the use of the hands. The patient's stride length is good.  Gait is cautious and narrow.    Thank you for allowing us  the opportunity to participate in the care of this nice patient. Please do not hesitate to contact us  for any questions or concerns.   Total time spent on today's visit was 43 minutes dedicated to this patient today, preparing to see patient, examining the patient, ordering tests and/or medications and counseling the patient, documenting clinical information in the EHR or other health record, independently interpreting results and communicating results to the patient/family, discussing treatment and goals, answering patient's questions and coordinating care.  Cc:  Eric Simmons  Tex Filbert 03/19/2024 12:24 PM

## 2024-03-19 NOTE — Patient Instructions (Signed)
 It was a pleasure to see you today at our office.   Recommendations:  Follow up in 6 months  Continue Memantine  10  increase it to twice a day . Side effects were discussed   Continue Primidone  but increase it to 50 mg  twice a day      RECOMMENDATIONS FOR ALL PATIENTS WITH MEMORY PROBLEMS: 1. Continue to exercise (Recommend 30 minutes of walking everyday, or 3 hours every week) 2. Increase social interactions - continue going to [place] and enjoy social gatherings with friends and family 3. Eat healthy, avoid fried foods and eat more fruits and vegetables 4. Maintain adequate blood pressure, blood sugar, and blood cholesterol level. Reducing the risk of stroke and cardiovascular disease also helps promoting better memory. 5. Avoid stressful situations. Live a simple life and avoid aggravations. Organize your time and prepare for the next day in anticipation. 6. Sleep well, avoid any interruptions of sleep and avoid any distractions in the bedroom that may interfere with adequate sleep quality 7. Avoid sugar, avoid sweets as there is a strong link between excessive sugar intake, diabetes, and cognitive impairment We discussed the Mediterranean diet, which has been shown to help patients reduce the risk of progressive memory disorders and reduces cardiovascular risk. This includes eating fish, eat fruits and green leafy vegetables, nuts like almonds and hazelnuts, walnuts, and also use olive oil. Avoid fast foods and fried foods as much as possible. Avoid sweets and sugar as sugar use has been linked to worsening of memory function.  There is always a concern of gradual progression of memory problems. If this is the case, then we may need to adjust level of care according to patient needs. Support, both to the patient and caregiver, should then be put into place.    FALL PRECAUTIONS: Be cautious when walking. Scan the area for obstacles that may increase the risk of trips and falls. When  getting up in the mornings, sit up at the edge of the bed for a few minutes before getting out of bed. Consider elevating the bed at the head end to avoid drop of blood pressure when getting up. Walk always in a well-lit room (use night lights in the walls). Avoid area rugs or power cords from appliances in the middle of the walkways. Use a walker or a cane if necessary and consider physical therapy for balance exercise. Get your eyesight checked regularly.  FINANCIAL OVERSIGHT: Supervision, especially oversight when making financial decisions or transactions is also recommended.  HOME SAFETY: Consider the safety of the kitchen when operating appliances like stoves, microwave oven, and blender. Consider having supervision and share cooking responsibilities until no longer able to participate in those. Accidents with firearms and other hazards in the house should be identified and addressed as well.   ABILITY TO BE LEFT ALONE: If patient is unable to contact 911 operator, consider using LifeLine, or when the need is there, arrange for someone to stay with patients. Smoking is a fire hazard, consider supervision or cessation. Risk of wandering should be assessed by caregiver and if detected at any point, supervision and safe proof recommendations should be instituted.  MEDICATION SUPERVISION: Inability to self-administer medication needs to be constantly addressed. Implement a mechanism to ensure safe administration of the medications.   DRIVING: Regarding driving, in patients with progressive memory problems, driving will be impaired. We advise to have someone else do the driving if trouble finding directions or if minor accidents are reported. Independent driving assessment  is available to determine safety of driving.   If you are interested in the driving assessment, you can contact the following:  [facility] [employer] in [place] [phone]  [facility]  [phone]  [facility] [phone]  [facility] [phone] or [phone]

## 2024-03-20 ENCOUNTER — Telehealth: Payer: Self-pay | Admitting: Physician Assistant

## 2024-03-20 ENCOUNTER — Other Ambulatory Visit: Payer: Self-pay | Admitting: Cardiology

## 2024-03-20 NOTE — Telephone Encounter (Signed)
 Patient is going to do 1.5 tablets not 2 tablets fyi. Will call with report.

## 2024-03-20 NOTE — Telephone Encounter (Signed)
 Pt's wife called in. She is worried about doubling the primidone . She was looking at the side effects online and she is concerned.

## 2024-03-24 ENCOUNTER — Ambulatory Visit (INDEPENDENT_AMBULATORY_CARE_PROVIDER_SITE_OTHER): Admitting: Family Medicine

## 2024-03-24 ENCOUNTER — Encounter: Payer: Self-pay | Admitting: Family Medicine

## 2024-03-24 VITALS — BP 142/70 | HR 64 | Ht 72.0 in | Wt 179.2 lb

## 2024-03-24 DIAGNOSIS — S6991XD Unspecified injury of right wrist, hand and finger(s), subsequent encounter: Secondary | ICD-10-CM

## 2024-03-24 NOTE — Progress Notes (Signed)
 Chief Complaint  Patient presents with   Follow-up    Follow-up middle finger, still having pain, takes the brace off at night to sleep. He states he can straighten his finger some.    He saw Jimmye Moulds on 5/22 after a fall. He fell onto outstretched palms and also scraped his R knee.  He had abrasions (from gravel) on his palms. R finger splint was applied. X-ray showed: IMPRESSION: Cortical irregularity about the distal phalanx at the distal interphalangeal joint may represent a nondisplaced fracture, seen only a single view.  His finger was bothering him a lot this weekend. He took the splint off at night--he slept better that way, but had more soreness in the morning.  "It is a lot better than it was". Feeling better today since wearing the splint and buddy taping it.  A friend was telling him about Dr. Lamarr Pilling who he would like to see, if seeing a specialist is warranted.    PMH, PSH, SH reviewed  Outpatient Encounter Medications as of 03/24/2024  Medication Sig Note   acetaminophen  (TYLENOL ) 650 MG CR tablet Take 650 mg by mouth every 8 (eight) hours as needed for pain. 03/24/2024: Took last night    amLODipine  (NORVASC ) 5 MG tablet TAKE ONE TABLET BY MOUTH DAILY AT NOON    aspirin  EC 81 MG tablet Take 1 tablet (81 mg total) by mouth in the morning. Swallow whole.    Cholecalciferol (VITAMIN D3) 50 MCG (2000 UT) TABS Take 1 tablet by mouth daily.    ezetimibe  (ZETIA ) 10 MG tablet TAKE ONE TABLET BY MOUTH DAILY    isosorbide  mononitrate (IMDUR ) 30 MG 24 hr tablet TAKE ONE TABLET EVERY MORNING AND ONE-HALF TABLET EVERY NIGHT    memantine  (NAMENDA ) 10 MG tablet Take 1 tablet (10 mg total) by mouth 2 (two) times daily.    metoprolol  succinate (TOPROL -XL) 25 MG 24 hr tablet take ONE-HALF TABLET BY MOUTH DAILY    nitroGLYCERIN  (NITROSTAT ) 0.4 MG SL tablet PLACE ONE TABLET UNDER THE TONGUE EVERY FIVE MINUTES AS NEEDED FOR CHEST PAIN    primidone  (MYSOLINE ) 50 MG tablet Take 1tab  twice a day    Propylene Glycol (SYSTANE COMPLETE) 0.6 % SOLN Place 1 drop into both eyes daily as needed (dry eyes). 08/05/2023: As needed   rosuvastatin  (CRESTOR ) 20 MG tablet TAKE ONE TABLET BY MOUTH ONCE DAILY    Lidocaine -Glycerin (PREPARATION H EX) Place 1 application  rectally daily as needed (irritation). (Patient not taking: Reported on 03/24/2024) 08/05/2023: As needed   losartan  (COZAAR ) 25 MG tablet Take 1 tablet (25 mg total) by mouth daily.    No facility-administered encounter medications on file as of 03/24/2024.   Allergies  Allergen Reactions   Codeine Nausea And Vomiting    violent vomiting    ROS:  no bleeding, bruising, f/c, URI symptoms or other complaints.  Just the finger swelling and discomfort. Hands and knee abrasions are healing.    PHYSICAL EXAM:  BP (!) 142/70   Pulse 64   Ht 6' (1.829 m)   Wt 179 lb 3.2 oz (81.3 kg)   BMI 24.30 kg/m   Pleasant male, in good spirits. Some trouble with finding some words Healing scabs on palms, R>L Slight yellow/bruising on the R 3rd DIP and distal phalynx. Nontender and no pain with ROM. Small scab at dorsal (radial aspect) 3rd DIP.  Soft tissue swelling over the 3rd PIP, and tender to palpation. Doesn't completely extend (but reports it is much better  than it had been), and some limited flexion as well. No warmth or skin abnormality, skin intact here. Brisk capillary refill and 2+ radial pulse    ASSESSMENT/PLAN:  Injury of right middle finger, subsequent encounter - spoke with radiologist--confirmed no fracture at PIP (some calcifications, chronic change). Improving. Cont supportive measures as needed  Discussed tylenol  prn. Discussed elevation, icing, and using splint as needed. No longer needs to buddy tape, unless he finds it helpful.  If ongoing or worsening pain, should continue with splinting, and we can recheck x-ray next week. He will contact us  if not continuing to improve.    Your pain and  swelling are at the proximal joint (not where the potential fracture was noted on the x-ray). We had the radiologist take another look at the x-ray. She saw some calcifications and chronic changes at your area of swelling, no fracture (likely more arthritic).  If your finger isn't continuing to improve over the next week, then let us  know and we can repeat an x-ray of the 3rd finger.  For now, you should try and keep the hand elevated (to help with pain and swelling). You can give yourself a break out of the splint, but wear it again if your develop more pain (from using your hand more). You no longer need to buddy-tape it to the next finger.  If you have ongoing issues and want to see Dr. Aloha Arnold, let us  know.

## 2024-03-24 NOTE — Patient Instructions (Signed)
 Your pain and swelling are at the proximal joint (not where the potential fracture was noted on the x-ray). We had the radiologist take another look at the x-ray. She saw some calcifications and chronic changes at your area of swelling, no fracture (likely more arthritic).  If your finger isn't continuing to improve over the next week, then let us  know and we can repeat an x-ray of the 3rd finger.  For now, you should try and keep the hand elevated (to help with pain and swelling). You can give yourself a break out of the splint, but wear it again if your develop more pain (from using your hand more). You no longer need to buddy-tape it to the next finger.  If you have ongoing issues and want to see Dr. Aloha Arnold, let us  know.

## 2024-03-26 NOTE — Telephone Encounter (Signed)
 Call to patient to advise that Lipids are at goal and to continue current therapy. No answer, left VM per DPR. Forwarded to PCP.

## 2024-03-26 NOTE — Telephone Encounter (Signed)
-----   Message from Gaylyn Keas sent at 03/10/2024  9:14 AM EDT ----- Lipids at goal continue current therapy and forward to PCP

## 2024-03-30 ENCOUNTER — Other Ambulatory Visit: Payer: Self-pay | Admitting: *Deleted

## 2024-03-30 ENCOUNTER — Ambulatory Visit: Payer: Self-pay

## 2024-03-30 DIAGNOSIS — S6991XD Unspecified injury of right wrist, hand and finger(s), subsequent encounter: Secondary | ICD-10-CM

## 2024-03-30 NOTE — Telephone Encounter (Signed)
 Copied from CRM [id number]. Topic: Clinical - Red Word Triage >> Mar 30, 2024 11:00 AM Eric Simmons wrote: Red Word that prompted transfer to Nurse Triage: Patient is calling to report that he saw Dr. Steward Elizabeth on 03/19/24 for injury of right middle finger, initial encounter +2 more. Still reporting soreness . Reporting that he did go to [facility] imaging. Was advised that he may need to go to a hand specialist. Patient is requesting a referral to Dr. Alyse Bach.  Would like a referral to see Dr. Alyse Bach, hand specialist.

## 2024-03-30 NOTE — Telephone Encounter (Signed)
 Is this okay, note said to call back and you would refer.

## 2024-03-30 NOTE — Telephone Encounter (Signed)
 Still having pain and swelling, referral to Dr. Aloha Arnold done.

## 2024-04-06 DIAGNOSIS — M79644 Pain in right finger(s): Secondary | ICD-10-CM | POA: Diagnosis not present

## 2024-05-04 ENCOUNTER — Other Ambulatory Visit: Payer: Self-pay | Admitting: Cardiology

## 2024-05-06 ENCOUNTER — Other Ambulatory Visit: Payer: Self-pay

## 2024-05-06 MED ORDER — ROSUVASTATIN CALCIUM 20 MG PO TABS
20.0000 mg | ORAL_TABLET | Freq: Every day | ORAL | 3 refills | Status: AC
Start: 2024-05-06 — End: ?

## 2024-06-01 ENCOUNTER — Other Ambulatory Visit: Payer: Self-pay | Admitting: Cardiology

## 2024-06-03 ENCOUNTER — Other Ambulatory Visit: Payer: Self-pay | Admitting: Cardiology

## 2024-06-04 ENCOUNTER — Telehealth: Payer: Self-pay | Admitting: Cardiology

## 2024-06-04 MED ORDER — LOSARTAN POTASSIUM 25 MG PO TABS: 25.0000 mg | ORAL_TABLET | Freq: Every day | ORAL | 3 refills | Status: AC

## 2024-06-04 NOTE — Telephone Encounter (Signed)
 Wife would like a call back to review medication list + discuss any medication interactions. Please advise.

## 2024-06-08 ENCOUNTER — Other Ambulatory Visit: Payer: Self-pay | Admitting: Cardiology

## 2024-06-10 NOTE — Telephone Encounter (Signed)
 Only significant drug-drug interactions I see are between primidone  and amlodipine  and primidone  and losartan . Primidone  may result in reduced efficacy of amlodipine  and losartan . If patient notices blood pressure increasing, can consider increase in amlodipine  or losartan .

## 2024-06-19 NOTE — Telephone Encounter (Signed)
 Attempted to call patient back to discuss questions about medications, no answer. LVM. Also sent MC.

## 2024-07-01 ENCOUNTER — Other Ambulatory Visit: Payer: Self-pay | Admitting: Cardiology

## 2024-07-05 NOTE — Progress Notes (Unsigned)
 No chief complaint on file.   Eric Simmons. is a 83 y.o. male who presents for annual physical exam, Medicare wellness visit and follow-up on chronic medical conditions.  He is accompanied by his wife.  HTN, CAD, HLD: He had elevated calcium  score (2461, 84%ile), and subsequently underwent cardiac cath (12/2021, 99% Ost RCA stenosis, 85% midRCA, 60% prox LAD with L>R collaterals).  He is under the care of Dr. Shlomo, last seen in 02/2024. No medication changes were made.  He is doing well on medical management, including aspirin  81 mg daily, Imdur  30 mg qam and 15mg  qpm, amlodipine  5 mg daily, Toprol  XL 12.5 mg daily and Crestor  20 mg daily   Hasn't needed any nitroglycerin .  Denies DOE, shortness of breath, chest pain palpitations, edema.   He walks 1.5-2 miles on the track at the gym at least 4 days/week. He also uses some light weights.   HTN: He tries to follow low sodium diet.  Compliant with medications including amlodipine  5 mg daily, Imdur  30 mg qam and 15mg  qpm, losartan  25 mg daily, Toprol -XL 12.5 mg daily.  BP's are only occasionally checked at home. He denies headaches, dizziness.  BP Readings from Last 3 Encounters:  03/24/24 (!) 142/70  03/19/24 120/74  03/06/24 122/66      Hyperlipidemia and aortic atherosclerosis:   He is on Crestor , and zetia  (added in 01/2022 to get LDL <55).  He denies side effects.  LDL at goal on last check by cardiologist in 02/2024.SABRA   He continues to limit cholesterol in his diet.   Lab Results  Component Value Date   CHOL 107 03/09/2024   HDL 44 03/09/2024   LDLCALC 49 03/09/2024   TRIG 67 03/09/2024   CHOLHDL 2.4 03/09/2024     Mild Cognitive Impairment due to Alzheimer's disease.  He is under the care of [facility] Jayson Sevin, [place]), last seen in May 2025. Namenda  dose was increased to 10 mg BID at that visit. He is scheduled for repeat neuropsych testing in October with Dr. Richie.  Essential tremor and sialorrhea--also managed by  Camie Sevin, PA.  She recommended increase of primidone  to 50 mg BID. They increased to 1.5 tablets daily. ***UPDATE    Impaired fasting glucose.  Admits to some sweets, multiple times/day, but small portions.  Has 3 spoonfuls of ice cream before bed nightly. 1/2 cookie at lunch. He denies any changes. Drinks sugar-free/caffeine-free Coke, part of a can. He has a light Eveline Pan with dinner occasionally.  He mostly drinks water (2-3 glasses/day). He continues to get regular exercise at the gym, as reported above.   Last A1c was 6.1% in 10/2023.  It was up to 6.3% in July 2024 (had been 5.8% the year prior). No polydipsia, polyuria (just nocturia, 2x/night).  BPH, incomplete bladder emptying and ED:  He has been under the care of Dr. Matilda, now seeing Dr. Shane since he retired. Previously took tamsulosin , which was stopped due to dizziness and changed to finasteride  5mg . He subsequently stopped taking finasteride  as well, and didn't really notice worsening of symptoms. He gets up once or twice a night to void. He previously was prescribed sildenafil  100mg  prn for ED. It stopped being effective, no longer needed (no longer having sexual relations).  PVR was 300 in March.  B-met was checked to ensure no kidney damage, no results received.  Advised if kidneys okay, can f/u yearly.    Immunization History  Administered Date(s) Administered  Fluad Quad(high Dose 65+) 07/16/2019, 07/06/2020, 10/02/2021   INFLUENZA, HIGH DOSE SEASONAL PF 08/19/2014, 06/22/2015, 08/20/2016, 08/20/2017   Influenza Split 07/18/2011, 08/28/2012   Influenza,inj,Quad PF,6+ Mos 07/06/2013   Influenza-Unspecified 07/25/2018   PFIZER(Purple Top)SARS-COV-2 Vaccination 11/18/2019, 12/07/2019, 08/02/2020, 03/29/2021   Pfizer(Comirnaty)Fall Seasonal Vaccine 12 years and older 08/07/2022, 07/23/2023   Pneumococcal Conjugate-13 06/14/2014   Pneumococcal Polysaccharide-23 12/13/2011, 05/03/2022   Tdap  03/02/2011, 06/27/2023   Zoster Recombinant(Shingrix) 07/27/2022, 10/02/2022   Zoster, Live 03/25/2014   Last colonoscopy: 12/2017 (Dr. Charlton adenomas x 4. He was told no follow up was needed (due to age) Last PSA: (no longer done due to age) Lab Results  Component Value Date   PSA 1.5 10/30/2017   PSA 1.73 12/27/2015   PSA 1.87 12/06/2014   Dentist: twice yearly   Ophtho: yearly  Exercise:  He walks 1.5-2 miles on the track at the gym at least 4 days/week. He also uses some light weights. Yardwork  Patient Care Team: Randol Dawes, MD as PCP - General (Family Medicine) Shlomo Wilbert SAUNDERS, MD as PCP - Cardiology (Cardiology) Liane Sharyne MATSU, [facility] (Inactive) as Pharmacist (Pharmacist) Waylan Cain, MD as Consulting Physician (Ophthalmology) Avram Lupita BRAVO, MD as Consulting Physician (Gastroenterology) Joshua Blamer, MD as Consulting Physician (Dermatology) Sheril Coy, MD as Consulting Physician (Orthopedic Surgery) Tat, Asberry RAMAN, DO as Consulting Physician (Neurology) Matilda Senior, MD as Consulting Physician (Urology) Deanie Norleen ORN, PT as Physical Therapist (Physical Therapy) Dentist: Dr. Luke Jefferson    Depression Screening: Flowsheet Row Office Visit from 05/16/2023 in [facility]  PHQ-2 Total Score 0     Falls screen:     03/19/2024   11:25 AM 11/21/2023   10:30 AM 09/18/2023    3:37 PM 07/08/2023    8:53 AM 05/16/2023    2:44 PM  Fall Risk   Falls in the past year? 1 0 0 0 0  Number falls in past yr: 0 0 0 0 0  Injury with Fall? 0 0 0 0 0  Risk for fall due to :  No Fall Risks   No Fall Risks  Follow up Falls evaluation completed  Falls evaluation completed Falls evaluation completed Falls evaluation completed     Functional Status Survey:        LAST YEAR ***clock drawing incorrect (numbers outside of circle, no hands on clock--made darker marks for the time)--unchanged from last year..  3/3 recall     End of Life Discussion:   Patient has a living will and medical power of attorney, scanned.    PMH, PSH, SH and FH reviewed and updated     ROS: The patient denies anorexia, fever, weight changes, headaches, decreased hearing, ear pain, hoarseness, chest pain, palpitations, dizziness, syncope, dyspnea on exertion, cough, swelling, nausea, vomiting, diarrhea, abdominal pain, melena, hematochezia, indigestion/heartburn, hematuria, dysuria, genital lesions, joint pains, weakness, suspicious skin lesions (sees derm regularly, gets treated), depression, anxiety, insomnia, abnormal bleeding/bruising, or enlarged lymph nodes    +ED. Denies urinary complaints--up 1-2x/night. [facility]ing[facility] [employer], short-lived, unchanged. No weakness. No joint pains. Afternoon nap x 1-2 hours. Memory issues per HPI.    PHYSICAL EXAM:  There were no vitals taken for this visit.  Wt Readings from Last 3 Encounters:  03/24/24 179 lb 3.2 oz (81.3 kg)  03/19/24 178 lb (80.7 kg)  03/06/24 177 lb 3.2 oz (80.4 kg)    General Appearance:   Alert, cooperative, no distress, appears stated age  Head:   Normocephalic, without obvious abnormality, atraumatic    Eyes:   PERRL, conjunctiva/corneas clear, EOM's intact, fundi benign. Brown pigmentation to upper portion of R eye (rest is blue), unchanged    Ears:   Normal TM's and external ear canals.   Nose:   Normal, no drainage or sinus tenderness  Throat:   Normal mucosa, no lesions  Neck:   Supple, no lymphadenopathy; thyroid : no enlargement/tenderness/nodules; no carotid bruit or JVD. C-spine nontender  Back:   Spine nontender, no curvature, ROM normal, no CVA tenderness    Lungs:   Clear to auscultation bilaterally without wheezes, rales or ronchi; respirations unlabored    Chest Wall:   No tenderness or deformity    Heart:   Regular rate and rhythm, S1 and S2 normal, no murmur, rub or gallop    Breast Exam:  No chest wall tenderness, masses or gynecomastia    Abdomen:    Soft, non-tender, nondistended, normoactive bowel sounds, no masses, no hepatosplenomegaly    Genitalia:   Deferred to urologist  Rectal:   Deferred to urologist  Extremities:   No clubbing, cyanosis or edema.   Pulses:   2+ and symmetric all extremities    Skin:   Skin color, turgor normal, no rashes. Skin is dry, especially on legs, with some actinic changes.  Lymph  nodes:   Cervical, supraclavicular nodes normal    Neurologic:   Normal strength, sensation and gait; reflexes 2+ and symmetric throughout. Mild intentional tremor bilaterally, also apparent in his handwriting              Psych:    Normal mood, affect, hygiene and grooming  ***UPDATE SKIN--dry on legs? Other changes? Tremor in handwriting? Intention tremor    ASSESSMENT/PLAN:    Flu shot Has he gotten RSV? Prevnar-20? COVID from pharmacy in 2 weeks  Recommended at least 30 minutes of aerobic activity at least 5 days/week, weight bearing exercise at least 2x/week; proper sunscreen use reviewed; healthy diet and alcohol recommendations (less than or equal to 2 drinks/day) reviewed; regular seatbelt use; changing batteries in smoke detectors. Immunization recommendations discussed--continue high dose flu shots yearly. *** Prevnar-20 *** RSV *** COVID booster recommended. Colonoscopy is no longer needed   MOST form reviewed and updated.  Full Code, Full Care.  F/u 1 year, sooner prn.   Medicare Attestation I have personally reviewed: The patient's medical and social history Their use of alcohol, tobacco or illicit drugs Their current medications and supplements The patient's functional ability including ADLs,fall risks, home safety risks, cognitive, and hearing and visual impairment Diet and physical activities Evidence for depression or mood disorders  The patient's weight, height, BMI have been recorded in the chart.  I have made referrals, counseling, and provided education to the patient based on review of  the above and I have provided the patient with a written personalized care plan for preventive services.

## 2024-07-05 NOTE — Patient Instructions (Incomplete)
  HEALTH MAINTENANCE RECOMMENDATIONS:  It is recommended that you get at least 30 minutes of aerobic exercise at least 5 days/week (for weight loss, you may need as much as 60-90 minutes). This can be any activity that gets your heart rate up. This can be divided in 10-15 minute intervals if needed, but try and build up your endurance at least once a week.  Weight bearing exercise is also recommended twice weekly.  Eat a healthy diet with lots of vegetables, fruits and fiber.  Colorful foods have a lot of vitamins (ie green vegetables, tomatoes, red peppers, etc).  Limit sweet tea, regular sodas and alcoholic beverages, all of which has a lot of calories and sugar.  Up to 2 alcoholic drinks daily may be beneficial for men (unless trying to lose weight, watch sugars).  Drink a lot of water.  Sunscreen of at least SPF 30 should be used on all sun-exposed parts of the skin when outside between the hours of 10 am and 4 pm (not just when at beach or pool, but even with exercise, golf, tennis, and yard work!)  Use a sunscreen that says broad spectrum so it covers both UVA and UVB rays, and make sure to reapply every 1-2 hours.  Remember to change the batteries in your smoke detectors when changing your clock times in the spring and fall.  Carbon monoxide detectors are recommended for your home.  Use your seat belt every time you are in a car, and please drive safely and not be distracted with cell phones and texting while driving.    Eric Simmons , Thank you for taking time to come for your Medicare Wellness Visit. I appreciate your ongoing commitment to your health goals. Please review the following plan we discussed and let me know if I can assist you in the future.   This is a list of the screening recommended for you and due dates:  Health Maintenance  Topic Date Due   Flu Shot  05/29/2024   COVID-19 Vaccine (7 - Pfizer risk 2024-25 season) 06/29/2024   Medicare Annual Wellness Visit  07/06/2025    DTaP/Tdap/Td vaccine (3 - Td or Tdap) 06/26/2033   Pneumococcal Vaccine for age over 57  Completed   Zoster (Shingles) Vaccine  Completed   HPV Vaccine  Aged Out   Meningitis B Vaccine  Aged Out   Check blood pressure regularly for a week or two. Write these down and contact Dr. Dorine office with the readings. Your blood pressure was on the low side today, with you NOT taking the losartan . The question for Dr. Shlomo is whether or not you should resume the losartan  (and if BP's are very low, she might not recommend that).  You may also want to check with Camie about the primidone .  We gave you your flu shot and prevnar-20 today. I recommend getting the COVID vaccine from the pharmacy at least 2 weeks from today's vaccine. Let us  know if you need a prescription for this.  I also recommend getting the RSV vaccine from the pharmacy.

## 2024-07-06 ENCOUNTER — Encounter: Payer: Self-pay | Admitting: Family Medicine

## 2024-07-06 ENCOUNTER — Ambulatory Visit: Payer: PPO | Admitting: Family Medicine

## 2024-07-06 VITALS — BP 100/60 | HR 68 | Ht 72.0 in | Wt 174.6 lb

## 2024-07-06 DIAGNOSIS — G309 Alzheimer's disease, unspecified: Secondary | ICD-10-CM | POA: Diagnosis not present

## 2024-07-06 DIAGNOSIS — Z5181 Encounter for therapeutic drug level monitoring: Secondary | ICD-10-CM | POA: Diagnosis not present

## 2024-07-06 DIAGNOSIS — G3184 Mild cognitive impairment, so stated: Secondary | ICD-10-CM | POA: Diagnosis not present

## 2024-07-06 DIAGNOSIS — I25119 Atherosclerotic heart disease of native coronary artery with unspecified angina pectoris: Secondary | ICD-10-CM

## 2024-07-06 DIAGNOSIS — Z0000 Encounter for general adult medical examination without abnormal findings: Secondary | ICD-10-CM | POA: Diagnosis not present

## 2024-07-06 DIAGNOSIS — Z23 Encounter for immunization: Secondary | ICD-10-CM

## 2024-07-06 DIAGNOSIS — I10 Essential (primary) hypertension: Secondary | ICD-10-CM

## 2024-07-06 DIAGNOSIS — E7800 Pure hypercholesterolemia, unspecified: Secondary | ICD-10-CM | POA: Diagnosis not present

## 2024-07-06 DIAGNOSIS — R7301 Impaired fasting glucose: Secondary | ICD-10-CM

## 2024-07-06 DIAGNOSIS — N401 Enlarged prostate with lower urinary tract symptoms: Secondary | ICD-10-CM

## 2024-07-06 DIAGNOSIS — R3914 Feeling of incomplete bladder emptying: Secondary | ICD-10-CM

## 2024-07-06 DIAGNOSIS — G250 Essential tremor: Secondary | ICD-10-CM

## 2024-07-06 LAB — POCT GLYCOSYLATED HEMOGLOBIN (HGB A1C): Hemoglobin A1C: 6 % — AB (ref 4.0–5.6)

## 2024-07-07 ENCOUNTER — Ambulatory Visit: Payer: Self-pay | Admitting: Family Medicine

## 2024-07-07 ENCOUNTER — Telehealth: Payer: Self-pay

## 2024-07-07 ENCOUNTER — Other Ambulatory Visit: Payer: Self-pay | Admitting: Cardiology

## 2024-07-07 LAB — COMPREHENSIVE METABOLIC PANEL WITH GFR
ALT: 31 IU/L (ref 0–44)
AST: 35 IU/L (ref 0–40)
Albumin: 4.6 g/dL (ref 3.7–4.7)
Alkaline Phosphatase: 97 IU/L (ref 44–121)
BUN/Creatinine Ratio: 17 (ref 10–24)
BUN: 21 mg/dL (ref 8–27)
Bilirubin Total: 0.4 mg/dL (ref 0.0–1.2)
CO2: 20 mmol/L (ref 20–29)
Calcium: 9.8 mg/dL (ref 8.6–10.2)
Chloride: 104 mmol/L (ref 96–106)
Creatinine, Ser: 1.22 mg/dL (ref 0.76–1.27)
Globulin, Total: 2.4 g/dL (ref 1.5–4.5)
Glucose: 119 mg/dL — AB (ref 70–99)
Potassium: 4.6 mmol/L (ref 3.5–5.2)
Sodium: 141 mmol/L (ref 134–144)
Total Protein: 7 g/dL (ref 6.0–8.5)
eGFR: 58 mL/min/1.73 — AB (ref >59)

## 2024-07-07 LAB — CBC WITH DIFFERENTIAL/PLATELET
Basophils Absolute: 0.1 x10E3/uL (ref 0.0–0.2)
Basos: 1 %
EOS (ABSOLUTE): 0.2 x10E3/uL (ref 0.0–0.4)
Eos: 2 %
Hematocrit: 45.4 % (ref 37.5–51.0)
Hemoglobin: 14.4 g/dL (ref 13.0–17.7)
Immature Grans (Abs): 0 x10E3/uL (ref 0.0–0.1)
Immature Granulocytes: 0 %
Lymphocytes Absolute: 1.8 x10E3/uL (ref 0.7–3.1)
Lymphs: 24 %
MCH: 29.7 pg (ref 26.6–33.0)
MCHC: 31.7 g/dL (ref 31.5–35.7)
MCV: 94 fL (ref 79–97)
Monocytes Absolute: 0.9 x10E3/uL (ref 0.1–0.9)
Monocytes: 12 %
Neutrophils Absolute: 4.6 x10E3/uL (ref 1.4–7.0)
Neutrophils: 61 %
Platelets: 220 x10E3/uL (ref 150–450)
RBC: 4.85 x10E6/uL (ref 4.14–5.80)
RDW: 13.1 % (ref 11.6–15.4)
WBC: 7.6 x10E3/uL (ref 3.4–10.8)

## 2024-07-07 NOTE — Progress Notes (Signed)
 Care Guide Pharmacy Note  07/07/2024 Name: Eric Simmons. MRN: [id number] DOB: [birth year]  Referred By: Randol Dawes, MD Reason for referral: Complex Care Management and Call Attempt #1 (Unsuccessful initial outreach to schedule with PHARM D- Jon)   [name] is a 83 y.o. year old male who is a primary care patient of Randol Dawes, MD.  [name] was referred to the pharmacist for assistance related to: HTN  An unsuccessful telephone outreach was attempted today to contact the patient who was referred to the pharmacy team for assistance with medication adherence. Additional attempts will be made to contact the patient.  [name] [facility], [facility] Guide  Direct Dial: [phone]  Fax [phone]

## 2024-07-08 NOTE — Progress Notes (Signed)
 Care Guide Pharmacy Note  07/08/2024 Name: Eric Simmons. MRN: [id number] DOB: 26-Oct-1941  Referred By: Randol Dawes, MD Reason for referral: Complex Care Management, Call Attempt #1 (Unsuccessful initial outreach to schedule with PHARM DGLENWOOD [place]), Call Attempt #2 (Unsuccessful initial outreach to schedule with Pharm DGLENWOOD [place]), and Call Attempt #3 (Unsuccessful initial outreach-Pt called and left voicemail that he did not want to schedule with a pharmacist. He said he has already spoken with another pharmacist and they have it figured out.)   [name] is a 83 y.o. year old male who is a primary care patient of Randol Dawes, MD.  [name] was referred to the pharmacist for assistance related to: HTN  An unsuccessful telephone outreach was attempted today to contact the patient who was referred to the pharmacy team for assistance with medication adherence. Additional attempts will be made to contact the patient.  [name] [facility], [facility] Guide  Direct Dial: [phone]  Fax [phone]

## 2024-07-08 NOTE — Progress Notes (Signed)
 Care Guide Pharmacy Note  07/08/2024 Name: Eric Simmons. MRN: [id number] DOB: 1941-07-02  Referred By: Randol Dawes, MD Reason for referral: Complex Care Management, Call Attempt #1 (Unsuccessful initial outreach to schedule with PHARM D[facility]WO[facility] [place]), and Call Attempt #2 (Unsuccessful initial outreach to schedule with Pharm D- [place])   [name] is a 83 y.o. year old male who is a primary care patient of Randol Dawes, MD.  [name] was referred to the pharmacist for assistance related to: HTN  A second unsuccessful telephone outreach was attempted today to contact the patient who was referred to the pharmacy team for assistance with medication adherence. Additional attempts will be made to contact the patient.  [name] [facility], [facility] Guide  Direct Dial: [phone]  Fax [phone]

## 2024-07-09 DIAGNOSIS — M19041 Primary osteoarthritis, right hand: Secondary | ICD-10-CM | POA: Diagnosis not present

## 2024-07-09 DIAGNOSIS — M79644 Pain in right finger(s): Secondary | ICD-10-CM | POA: Diagnosis not present

## 2024-07-09 DIAGNOSIS — M24541 Contracture, right hand: Secondary | ICD-10-CM | POA: Diagnosis not present

## 2024-07-09 DIAGNOSIS — M25641 Stiffness of right hand, not elsewhere classified: Secondary | ICD-10-CM | POA: Diagnosis not present

## 2024-07-14 DIAGNOSIS — M25641 Stiffness of right hand, not elsewhere classified: Secondary | ICD-10-CM | POA: Diagnosis not present

## 2024-07-17 DIAGNOSIS — M25641 Stiffness of right hand, not elsewhere classified: Secondary | ICD-10-CM | POA: Diagnosis not present

## 2024-07-23 ENCOUNTER — Emergency Department (HOSPITAL_COMMUNITY)
Admission: EM | Admit: 2024-07-23 | Discharge: 2024-07-23 | Disposition: A | Discharge status: Home / Self Care | Attending: Emergency Medicine | Admitting: Emergency Medicine

## 2024-07-23 ENCOUNTER — Other Ambulatory Visit: Payer: Self-pay

## 2024-07-23 ENCOUNTER — Emergency Department (HOSPITAL_COMMUNITY)

## 2024-07-23 ENCOUNTER — Encounter (HOSPITAL_COMMUNITY): Payer: Self-pay

## 2024-07-23 DIAGNOSIS — Z7982 Long term (current) use of aspirin: Secondary | ICD-10-CM | POA: Diagnosis not present

## 2024-07-23 DIAGNOSIS — M25641 Stiffness of right hand, not elsewhere classified: Secondary | ICD-10-CM | POA: Diagnosis not present

## 2024-07-23 DIAGNOSIS — Z79899 Other long term (current) drug therapy: Secondary | ICD-10-CM | POA: Diagnosis not present

## 2024-07-23 DIAGNOSIS — R531 Weakness: Secondary | ICD-10-CM | POA: Diagnosis not present

## 2024-07-23 DIAGNOSIS — G459 Transient cerebral ischemic attack, unspecified: Secondary | ICD-10-CM | POA: Diagnosis not present

## 2024-07-23 DIAGNOSIS — I700 Atherosclerosis of aorta: Secondary | ICD-10-CM | POA: Diagnosis not present

## 2024-07-23 DIAGNOSIS — R55 Syncope and collapse: Secondary | ICD-10-CM | POA: Insufficient documentation

## 2024-07-23 LAB — I-STAT CHEM 8, ED
BUN: 23 mg/dL (ref 8–23)
Calcium, Ion: 1.31 mmol/L (ref 1.15–1.40)
Chloride: 103 mmol/L (ref 98–111)
Creatinine, Ser: 1.3 mg/dL — ABNORMAL HIGH (ref 0.61–1.24)
Glucose, Bld: 100 mg/dL — ABNORMAL HIGH (ref 70–99)
HCT: 43 % (ref 39.0–52.0)
Hemoglobin: 14.6 g/dL (ref 13.0–17.0)
Potassium: 4 mmol/L (ref 3.5–5.1)
Sodium: 140 mmol/L (ref 135–145)
TCO2: 26 mmol/L (ref 22–32)

## 2024-07-23 LAB — COMPREHENSIVE METABOLIC PANEL WITH GFR
ALT: 28 U/L (ref 0–44)
AST: 30 U/L (ref 15–41)
Albumin: 3.8 g/dL (ref 3.5–5.0)
Alkaline Phosphatase: 67 U/L (ref 38–126)
Anion gap: 8 (ref 5–15)
BUN: 21 mg/dL (ref 8–23)
CO2: 26 mmol/L (ref 22–32)
Calcium: 9.6 mg/dL (ref 8.9–10.3)
Chloride: 104 mmol/L (ref 98–111)
Creatinine, Ser: 1.18 mg/dL (ref 0.61–1.24)
GFR, Estimated: >60 mL/min (ref >60)
Glucose, Bld: 103 mg/dL — ABNORMAL HIGH (ref 70–99)
Potassium: 4 mmol/L (ref 3.5–5.1)
Sodium: 138 mmol/L (ref 135–145)
Total Bilirubin: 0.9 mg/dL (ref 0.0–1.2)
Total Protein: 6.6 g/dL (ref 6.5–8.1)

## 2024-07-23 LAB — PROTIME-INR
INR: 1 (ref 0.8–1.2)
Prothrombin Time: 13.2 s (ref 11.4–15.2)

## 2024-07-23 LAB — CBC
HCT: 43.7 % (ref 39.0–52.0)
Hemoglobin: 14.2 g/dL (ref 13.0–17.0)
MCH: 29.3 pg (ref 26.0–34.0)
MCHC: 32.5 g/dL (ref 30.0–36.0)
MCV: 90.3 fL (ref 80.0–100.0)
Platelets: 206 K/uL (ref 150–400)
RBC: 4.84 MIL/uL (ref 4.22–5.81)
RDW: 13.9 % (ref 11.5–15.5)
WBC: 6.3 K/uL (ref 4.0–10.5)
nRBC: 0 % (ref 0.0–0.2)

## 2024-07-23 LAB — DIFFERENTIAL
Abs Immature Granulocytes: 0.02 K/uL (ref 0.00–0.07)
Basophils Absolute: 0.1 K/uL (ref 0.0–0.1)
Basophils Relative: 1 %
Eosinophils Absolute: 0.2 K/uL (ref 0.0–0.5)
Eosinophils Relative: 3 %
Immature Granulocytes: 0 %
Lymphocytes Relative: 24 %
Lymphs Abs: 1.5 K/uL (ref 0.7–4.0)
Monocytes Absolute: 0.8 K/uL (ref 0.1–1.0)
Monocytes Relative: 12 %
Neutro Abs: 3.7 K/uL (ref 1.7–7.7)
Neutrophils Relative %: 60 %

## 2024-07-23 LAB — ETHANOL: Alcohol, Ethyl (B): <15 mg/dL (ref <15)

## 2024-07-23 LAB — CBG MONITORING, ED: Glucose-Capillary: 117 mg/dL — ABNORMAL HIGH (ref 70–99)

## 2024-07-23 LAB — TROPONIN I (HIGH SENSITIVITY)
Troponin I (High Sensitivity): 9 ng/L (ref <18)
Troponin I (High Sensitivity): 9 ng/L (ref <18)

## 2024-07-23 LAB — APTT: aPTT: 30 s (ref 24–36)

## 2024-07-23 NOTE — ED Notes (Signed)
 Patient transported to CT

## 2024-07-23 NOTE — ED Triage Notes (Signed)
 Coming from [facility] and while patient was there around [phone] patient had sudden loc with drooling approx 1.5 min and slight right sided weakness but resolved by the time ems got there.  Denies headache or neuro issues.  HTN hx and currently Hypertensive.    18g LAC CBG 138 193/83 HR 56

## 2024-07-23 NOTE — Discharge Instructions (Addendum)
 Your passing out today was probably pain related but it is important to follow-up with your primary care provider as well as your cardiologist for further outpatient testing as warranted.  If you develop recurrent symptoms or any other new/concerning symptoms then return to the ER.

## 2024-07-23 NOTE — ED Provider Notes (Signed)
 [facility]MENT AT [facility] Provider Note   CSN: [id number] Arrival date & time: 07/23/24  9049     Patient presents with: Cerebrovascular Accident   Eric Simmons. is a 83 y.o. male.   HPI 83 year old male presents with syncope at the orthopedic office.  He was having therapy on his right finger due to a prior injury and they were trying to work on straightening his finger.  He states it was extremely painful when this was going on and then he seemed to all of a sudden pass out.  The patient denies feeling any preceding dizziness or lightheadedness, palpitations, headache, chest pain.  EMS reports the patient was reportedly unconscious for a minute or 2 according to the orthopedic office.  He seemed to have some drooling out of the right side of his mouth and when he woke up he seemed to have some right hand grip weakness.  Patient does not remember doing that testing and so is not sure if his hand was truly weak or if it was limited due to his finger.  He denies any current symptoms.  Prior to Admission medications   Medication Sig Start Date End Date Taking? Authorizing Provider  acetaminophen  (TYLENOL ) 650 MG CR tablet Take 650 mg by mouth every 8 (eight) hours as needed for pain.    Provider, Historical, Simmons  amLODipine  (NORVASC ) 5 MG tablet TAKE ONE TABLET BY MOUTH DAILY AT NOON 03/20/24   Eric Simmons  aspirin  EC 81 MG tablet Take 1 tablet (81 mg total) by mouth in the morning. Swallow whole. 02/19/22   Eric Simmons  Cholecalciferol (VITAMIN D3) 50 MCG (2000 UT) TABS Take 1 tablet by mouth daily.    Provider, Historical, Simmons  ezetimibe  (ZETIA ) 10 MG tablet TAKE ONE TABLET BY MOUTH DAILY 03/03/24   Eric Simmons  isosorbide  mononitrate (IMDUR ) 30 MG 24 hr tablet TAKE ONE TABLET EVERY MORNING AND ONE-HALF TABLET EVERY NIGHT 10/09/23   Eric Simmons  Lidocaine -Glycerin (PREPARATION H EX) Place 1 application  rectally daily as needed  (irritation).    Provider, Historical, Simmons  losartan  (COZAAR ) 25 MG tablet Take 1 tablet (25 mg total) by mouth daily. Patient not taking: Reported on 07/06/2024 06/04/24 09/02/24  Eric Simmons  losartan  (COZAAR ) 50 MG tablet Take 50 mg by mouth every morning. 03/02/24   Provider, Historical, Simmons  memantine  (NAMENDA ) 10 MG tablet Take 1 tablet (10 mg total) by mouth 2 (two) times daily. 03/19/24   Eric Simmons  metoprolol  succinate (TOPROL -XL) 25 MG 24 hr tablet take ONE-HALF TABLET BY MOUTH DAILY 07/01/24   Eric Simmons  nitroGLYCERIN  (NITROSTAT ) 0.4 MG SL tablet PLACE ONE TABLET UNDER THE TONGUE EVERY FIVE MINUTES AS NEEDED FOR CHEST PAIN Patient not taking: Reported on 07/06/2024 07/24/23   Eric Simmons  primidone  (MYSOLINE ) 50 MG tablet Take 1tab twice a day Patient not taking: Reported on 07/06/2024 03/19/24   Eric Simmons  Propylene Glycol (SYSTANE COMPLETE) 0.6 % SOLN Place 1 drop into both eyes daily as needed (dry eyes).    Provider, Historical, Simmons  rosuvastatin  (CRESTOR ) 20 MG tablet Take 1 tablet (20 mg total) by mouth daily. 05/06/24   Eric Simmons    Allergies: Codeine    Review of Systems  Respiratory:  Negative for shortness of breath.   Cardiovascular:  Negative for chest pain and palpitations.  Neurological:  Positive for syncope. Negative for dizziness, weakness, light-headedness, numbness and headaches.    Updated Vital Signs BP (!) 164/102   Pulse (!) 57   Temp 97.9 F (36.6 C) (Oral)   Resp 13   Ht 6' (1.829 m)   Wt 80.6 kg   SpO2 100%   BMI 24.10 kg/m   Physical Exam Vitals and nursing note reviewed.  Constitutional:      Appearance: He is well-developed.  HENT:     Head: Normocephalic and atraumatic.  Eyes:     Extraocular Movements: Extraocular movements intact.     Pupils: Pupils are equal, round, and reactive to light.  Cardiovascular:     Rate and Rhythm: Normal rate and regular rhythm.     Heart sounds: Normal heart  sounds.  Pulmonary:     Effort: Pulmonary effort is normal.     Breath sounds: Normal breath sounds.  Abdominal:     General: There is no distension.  Skin:    General: Skin is warm and dry.  Neurological:     Mental Status: He is alert.     Comments: CN 3-12 grossly intact. 5/5 strength in all 4 extremities. Grossly normal sensation. Normal finger to nose.      (all labs ordered are listed, but only abnormal results are displayed) Labs Reviewed  COMPREHENSIVE METABOLIC PANEL WITH GFR - Abnormal; Notable for the following components:      Result Value   Glucose, Bld 103 (*)    All other components within normal limits  CBG MONITORING, ED - Abnormal; Notable for the following components:   Glucose-Capillary 117 (*)    All other components within normal limits  I-STAT CHEM 8, ED - Abnormal; Notable for the following components:   Creatinine, Ser 1.30 (*)    Glucose, Bld 100 (*)    All other components within normal limits  ETHANOL  PROTIME-INR  CBC  DIFFERENTIAL  APTT  RAPID URINE DRUG SCREEN, HOSP PERFORMED  TROPONIN I (HIGH SENSITIVITY)  TROPONIN I (HIGH SENSITIVITY)    EKG: EKG Interpretation Date/Time:  Thursday July 23 2024 10:04:05 EDT Ventricular Rate:  62 PR Interval:  203 QRS Duration:  129 QT Interval:  432 QTC Calculation: 439 R Axis:   -48  Text Interpretation: Sinus rhythm Left bundle branch block no significant change since 2024 Confirmed by Eric Simmons [phone]) on 07/23/2024 10:04:56 AM  Radiology: [facility] Chest Portable 1 View Result Date: 07/23/2024 EXAM: 1 VIEW(S) XRAY OF THE CHEST 07/23/2024 10:28:29 AM COMPARISON: 11/01/2023 CLINICAL HISTORY: syncope. Syncope,? Cva; rover FINDINGS: LUNGS AND PLEURA: No focal pulmonary opacity. No pulmonary edema. No pleural effusion. No pneumothorax. HEART AND MEDIASTINUM: Aortic atherosclerosis. BONES AND SOFT TISSUES: Degenerative changes of right shoulder. No acute osseous abnormality. IMPRESSION: 1. No acute  cardiopulmonary process. 2. Aortic atherosclerosis. Electronically signed by: Eric Simmons 07/23/2024 11:33 AM EDT RP Workstation: [id number]   CT HEAD WO CONTRAST Result Date: 07/23/2024 CLINICAL DATA:  Transient ischemic attack (TIA) EXAM: CT HEAD WITHOUT CONTRAST TECHNIQUE: Contiguous axial images were obtained from the base of the skull through the vertex without intravenous contrast. RADIATION DOSE REDUCTION: This exam was performed according to the departmental dose-optimization program which includes automated exposure control, adjustment of the mA and/or kV according to patient size and/or use of iterative reconstruction technique. COMPARISON:  MRI head 07/18/2019. FINDINGS: Brain: Remote left cerebral hemisphere multifocal encephalomalacia, similar to prior MRI. No evidence of acute large vascular territory infarct, acute hemorrhage, mass lesion, or  midline shift. Vascular: Calcific atherosclerosis. Skull: No acute fracture. Sinuses/Orbits: Clear sinuses.  No acute orbital findings. Other: No mastoid effusions. IMPRESSION: 1. No evidence of acute intracranial abnormality. 2. Multifocal remote left cerebral hemisphere encephalomalacia. An MRI could better assess for acute infarct if clinically warranted. Electronically Signed   By: Gilmore GORMAN Molt M.D.   On: 07/23/2024 11:29     Procedures   Medications Ordered in the ED - No data to display                                  Medical Decision Making Amount and/or Complexity of Data Reviewed Independent Historian: EMS Labs: ordered.    Details: Normal troponins x 2 Radiology: ordered and independent interpretation performed.    Details: No head bleed ECG/medicine tests: ordered and independent interpretation performed.    Details: Sinus rhythm   Ultimately, patient probably had a vasovagal event given it was associated with severe pain in his finger.  There was a question of his right hand being weak though that is not present now  and was not present with EMS.  Could have just been related to his finger problem.  Either way, it seems like TIA would be unlikely to cause syncope itself.  Probably not a seizure.  Either way with his age and comorbidities (CAD, hypertension, atherosclerosis, etc.) I did discuss we could could consider admission and observation and further workup.  However he and wife do not want to do that.  I do think vasovagal is the most likely cause those stressed that he should follow-up closely with his PCP and cardiologist.  Return if symptoms worsen or recur.     Final diagnoses:  Vasovagal syncope    ED Discharge Orders          Ordered    Ambulatory referral to Cardiology       Comments: If you have not heard from the Cardiology office within the next 72 hours please call [phone].   07/23/24 1325               Eric Hamilton, Simmons 07/23/24 1506

## 2024-07-30 DIAGNOSIS — M25641 Stiffness of right hand, not elsewhere classified: Secondary | ICD-10-CM | POA: Diagnosis not present

## 2024-08-13 DIAGNOSIS — M25642 Stiffness of left hand, not elsewhere classified: Secondary | ICD-10-CM | POA: Diagnosis not present

## 2024-08-19 DIAGNOSIS — M25641 Stiffness of right hand, not elsewhere classified: Secondary | ICD-10-CM | POA: Diagnosis not present

## 2024-08-19 DIAGNOSIS — M79644 Pain in right finger(s): Secondary | ICD-10-CM | POA: Diagnosis not present

## 2024-08-19 DIAGNOSIS — M24541 Contracture, right hand: Secondary | ICD-10-CM | POA: Diagnosis not present

## 2024-08-19 DIAGNOSIS — M19041 Primary osteoarthritis, right hand: Secondary | ICD-10-CM | POA: Diagnosis not present

## 2024-08-25 ENCOUNTER — Ambulatory Visit: Payer: Self-pay | Admitting: Psychology

## 2024-08-25 ENCOUNTER — Ambulatory Visit: Payer: PPO | Admitting: Psychology

## 2024-08-25 ENCOUNTER — Encounter: Payer: Self-pay | Admitting: Psychology

## 2024-08-25 DIAGNOSIS — G308 Other Alzheimer's disease: Secondary | ICD-10-CM

## 2024-08-25 DIAGNOSIS — F0280 Dementia in other diseases classified elsewhere without behavioral disturbance: Secondary | ICD-10-CM | POA: Insufficient documentation

## 2024-08-25 DIAGNOSIS — R4189 Other symptoms and signs involving cognitive functions and awareness: Secondary | ICD-10-CM

## 2024-08-25 HISTORY — DX: Dementia in other diseases classified elsewhere, unspecified severity, without behavioral disturbance, psychotic disturbance, mood disturbance, and anxiety: F02.80

## 2024-08-25 NOTE — Progress Notes (Signed)
   Psychometrician Note   Cognitive testing was administered to Eric Simmons. by Lonell Jude, B.S. (psychometrist) under the supervision of Dr. Arthea KYM Maryland, Ph.D., ABPP, licensed psychologist on 08/25/2024. [name] did not appear overtly distressed by the testing session per behavioral observation or responses across self-report questionnaires. Rest breaks were offered.   The battery of tests administered was selected by Dr. Zachary C. Merz, Ph.D., ABPP with consideration to [name]'s current level of functioning, the nature of his symptoms, emotional and behavioral responses during interview, level of literacy, observed level of motivation/effort, and the nature of the referral question. This battery was communicated to the psychometrist. Communication between Dr. Arthea KYM Maryland, Ph.D., ABPP and the psychometrist was ongoing throughout the evaluation and Dr. Arthea KYM Maryland, Ph.D., ABPP was immediately accessible at all times. Dr. Zachary C. Merz, Ph.D., ABPP provided supervision to the psychometrist on the date of this service to the extent necessary to assure the quality of all services provided.    Eric Simmons. will return within approximately 1-2 weeks for an interactive feedback session with Dr. Maryland at which time his test performances, clinical impressions, and treatment recommendations will be reviewed in detail. Mr. Hulsebus understands he can contact our office should he require our assistance before this time.  A total of 135 minutes of billable time were spent face-to-face with Mr. Geerts by the psychometrist. This includes both test administration and scoring time. Billing for these services is reflected in the clinical report generated by Dr. Arthea KYM Maryland, Ph.D., ABPP  This note reflects time spent with the psychometrician and does not include test scores or any clinical interpretations made by Dr. Maryland. The full report will follow in a separate note.

## 2024-08-25 NOTE — Progress Notes (Signed)
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 neuropsychological testing is limited by the extent to which the individual being tested may be assumed to have exerted adequate effort during testing. Mr. Cull expressed his intention to perform to the best of his abilities and exhibited adequate task engagement and persistence. Scores across stand-alone and embedded performance validity measures were within expectation. As such, the results of the current evaluation are believed to be a valid representation of Mr. Dibbern's current cognitive functioning.  Test Results: Mr. Danielsen was mildly disoriented at the time of the current evaluation. He incorrectly stated the current year (84 or 59) and was unable to state the current date.   Intellectual abilities based upon educational and vocational attainment were estimated to be in the average range. Premorbid abilities were estimated to be within the average range based upon a single-word reading test.   Processing speed was exceptionally low to below average. Basic attention was average. More complex attention (e.g., working memory) was exceptionally low. Executive functioning was  exceptionally low to well below average.  Assessed receptive language abilities were average. Likewise, Mr. Goodchild did not exhibit any difficulties comprehending task instructions and answered all questions asked of him appropriately. Assessed expressive language was variable. Phonemic fluency was average, semantic fluency was exceptionally low, and confrontation naming was exceptionally low.     Assessed visuospatial/visuoconstructional abilities were average to exceptionally high.    Learning (i.e., encoding) of novel verbal and visual information was exceptionally low to well below average. Spontaneous delayed recall (i.e., retrieval) of previously learned information was exceptionally low to below average. Retention rates were poor across verbal memory tasks. Performance across recognition tasks was exceptionally low to below average, suggesting negligible evidence for information consolidation.   Results of emotional screening instruments suggested that recent symptoms of generalized anxiety were in the minimal range, while symptoms of depression were within normal limits. A screening instrument assessing recent sleep quality suggested the presence of minimal sleep dysfunction.  Table of Scores:   Note: This summary of test scores accompanies the interpretive report and should not be considered in isolation without reference to the appropriate sections in the text. Descriptors are based on appropriate normative data and may be adjusted based on clinical judgment. Terms such as Within Normal Limits and Outside Normal Limits are used when a more specific description of the test score cannot be determined. Descriptors refer to the current evaluation only.  Percentile - Normative Descriptor > 98 - Exceptionally High 91-97 - Well Above Average 75-90 - Above Average 25-74 - Average 9-24 - Below Average 2-8 - Well Below Average < 2 - Exceptionally Low         Validity: November 2020  October 2024 Current  DESCRIPTOR         DCT: --- --- --- --- Within Normal Limits  CVLT-III FC: --- --- --- --- Within Normal Limits         Orientation:        Raw Score Raw Score Raw Score Percentile   NAB Orientation, Form 1 29/29 26/29 25/29  --- ---         Cognitive Screening:        Raw Score Raw Score Raw Score Percentile   SLUMS: --- 17/30 12/30 --- ---         Intellectual Functioning:        Standard Score Standard Score Standard Score Percentile   Test of Premorbid Functioning: 112 109 98 45 Average         Memory:       Wechsler Memory Scale (WMS-IV):                       Raw Score (Scaled Score) Raw Score (Scaled Score) Raw Score (Scaled Score) Percentile     Logical Memory I 33/53 (11) 16/53 (6) 13/53 (5) 5 Well Below Average    Logical Memory II 10/39 (8) 0/39 (1) 2/39 (5) 5 Well Below Average    Logical Memory Recognition 15/23 14/23 15/23  17-25 Below Average         [place]  Verbal Learning Test (CVLT-III) Brief Form: Raw Score (Scaled/Standard Score) Raw Score (Scaled/Standard Score) Raw Score (Scaled/Standard Score) Percentile     Total Trials 1-4 --- --- 14/36 (66) 1 Exceptionally Low    Short-Delay Free Recall --- --- 3/9 (3) 1 Exceptionally Low    Long-Delay Free Recall --- --- 0/9 (1) <1 Exceptionally Low    Long-Delay Cued Recall --- --- 4/9 (4) 2 Well Below Average    Recognition Hits --- --- 8/9 (10) 50 Average    False Positive Errors --- --- 7 (1) <1 Exceptionally Low         Hopkins Verbal Learning Test (HVLT-R), Form 1: Raw Score (T Score) Raw Score (T Score) Raw Score (T Score) Percentile     Total Trials 1-3 20/36 (45) 16/36 (35) --- --- ---    Delayed Recall 2/12 (38) 0/12 (29) --- --- ---    Recognition Discrimination Index 8 8 (41) --- --- ---         Brief Visuospatial Memory Test (BVMT-R), Form 1: Raw Score (T Score) Raw Score (T Score) Raw Score (T Score) Percentile     Total Trials 1-3 14/36 (49) 4/36 (35) 1/36 (30) 2  Well Below Average    Delayed Recall 2/12 (38) 1/12 (34) 2/12 (38) 12 Below Average    Recognition Discrimination Index 6 5 (48) 2 (21) 0 Exceptionally Low    Recognition Hits 6/6 5/6 (44) 4/6 (31) 3 Well Below Average    False Positive Errors 0 0 (54) 2 (26) 1 Exceptionally Low  *From Duff (2016)              Attention/Executive Function:       Trail Making Test (TMT): Raw Score (T Score) Raw Score (T Score) Raw Score (T Score) Percentile     Part A  57 secs.,  0 errors (36) 76 secs.,  0 errors (29) 68 secs.,  0 errors (33) 5 Well Below Average    Part B 124 secs.,  0 errors (40) 120 secs.,  0 errors (47) 245 secs.,  0 errors (34) 5 Well Below Average          Symbol Digit Modalities Test (SDMT): Raw Score (Z-Score) Raw Score (Z-Score) Raw Score (Z-Score) Percentile     Oral 30 (-1.7) 19 (-2.5) 21 (-2.35) 1 Exceptionally Low         NAB Attention Module, Form 1: T Score T Score T Score Percentile     Digits Forward 59 60 46 34 Average    Digits Backwards 50 46 20 <1 Exceptionally Low         D-KEFS Color-Word Interference Test: Raw Score (Scaled Score) Raw Score (Scaled Score) Raw Score (Scaled Score) Percentile     Color Naming 40 secs. (7) 48 secs. (5) 45 secs. (6) 9 Below Average    Word Reading 28 secs. (9) 32 secs. (7) 33 secs. (7) 16 Below Average    Inhibition 145 secs. (1) Discontinued 156 secs. (2) <1 Exceptionally Low      Total Errors 5 errors --- 3 errors (10) 50 Average    Inhibition/Switching 106 secs. (6) 126 secs. (7) 142 secs. (5) 5 Well Below Average      Total Errors 3 errors 5 errors (9) 9 errors (5) 5 Well Below Average         Language:       Verbal Fluency Test: Raw Score (T Score) Raw Score (T Score) Raw Score (T Score) Percentile     Phonemic Fluency (FAS) 29 (42) 37 (46) 37 (46) 34 Average    Animal Fluency 7 (19) 6 (20) 6 (20) <1 Exceptionally Low          NAB Language Module, Form 1: T Score T Score T Score Percentile     Auditory  Comprehension --- 54 54 66 Average    Naming 30/31 (58) 20/31 (19) 19/31 (19) <1 Exceptionally Low         Visuospatial/Visuoconstruction:        Raw Score Raw Score Raw Score Percentile   Clock Drawing: 5/10 8/10 8/10 --- Within Normal Limits         NAB Spatial Module, Form 1: T Score T Score T Score Percentile     Figure Drawing Copy --- --- 72 99 Exceptionally High          Scaled Score Scaled Score Scaled Score Percentile   WAIS-IV Matrix Reasoning: 11 12 10  50 Average         Mood and Personality:        Raw Score Raw Score Raw Score Percentile   Geriatric Depression Scale: 9 3 5  --- Within Normal Limits  Geriatric Anxiety Scale: 5 1 1  --- Minimal    Somatic 3 0 0 --- Minimal    Cognitive 2 1 1  --- Minimal    Affective 0 0 0 --- Minimal         Additional Questionnaires:        Raw Score Raw Score Raw Score Percentile   PROMIS Sleep Disturbance Questionnaire: 17 10 8  --- None to Slight   Informed Consent and Coding/Compliance:   The current evaluation represents a clinical evaluation for the purposes previously outlined by the referral source and is in no way reflective of a forensic evaluation.   Mr.  Dicicco was provided with a verbal description of the nature and purpose of the present neuropsychological evaluation. Also reviewed were the foreseeable risks and/or discomforts and benefits of the procedure, limits of confidentiality, and mandatory reporting requirements of this provider. The patient was given the opportunity to ask questions and receive answers about the evaluation. Oral consent to participate was provided by the patient.   This evaluation was conducted by Arthea KYM Maryland, Ph.D., ABPP-CN, board certified clinical neuropsychologist. Mr. Paradis completed a clinical interview with Dr. Maryland, billed as one unit [phone], and 135 minutes of cognitive testing and scoring, billed as one unit [phone] and three additional units 96139. Psychometrist Lonell Jude, B.S. assisted  Dr. Maryland with test administration and scoring procedures. As a separate and discrete service, one unit [phone] and two units 96133 (166 minutes) were billed for Dr. Loralee time spent in interpretation and report writing.

## 2024-09-01 ENCOUNTER — Ambulatory Visit: Payer: PPO | Admitting: Psychology

## 2024-09-01 DIAGNOSIS — F0280 Dementia in other diseases classified elsewhere without behavioral disturbance: Secondary | ICD-10-CM | POA: Diagnosis not present

## 2024-09-01 DIAGNOSIS — G308 Other Alzheimer's disease: Secondary | ICD-10-CM

## 2024-09-01 NOTE — Progress Notes (Signed)
   Neuropsychology Feedback Session Jolynn DEL. [facility] [place] Department of Neurology  Reason for Referral:   Eric Simmons. is a 83 y.o. right-handed male referred by Camie Sevin, PA-C, to characterize his current cognitive functioning and assist with diagnostic clarity and treatment planning in the context of a previously diagnosed mild neurocognitive disorder, concerns for underlying Alzheimer's disease, and concerns for progressive cognitive decline.   Feedback:   [name] completed a comprehensive neuropsychological evaluation on 08/25/2024. Please refer to that encounter for the full report and recommendations. Briefly, results suggested significant impairment surrounding complex attention, executive functioning, semantic fluency, confrontation naming, and all aspects of learning and memory. An additional weakness was exhibited across processing speed. Relative to his initial neuropsychological evaluation in November 2020, quite substantial decline is evident across complex attention, confrontation naming, and all aspects of learning and memory. More mild decline could be argued across processing speed, basic attention, and executive functioning. Other assessed domains (including severe semantic fluency impairments) have been stable over time. Relative to his most recent October 2024 evaluation, his greatest area of decline surrounded attention/concentration and cognitive flexibility. Mild decline was also exhibited across memory. Regarding the cause for his mild dementia presentation, evidence for progressive decline over time makes the presence of a neurodegenerative illness quite likely. Concerns remain for underlying Alzheimer's pathology, including clinical presentations surrounding both Alzheimer's disease or a logopenic aphasia presentation. Retention rates were very poor across memory testing and he generally did not benefit from cueing, suggesting continued evidence  for rapid forgetting and a prominent storage impairment which are the hallmark memory patterns for this type of illness. Additional prominent impairments surrounding expressive language would align with the expected disease progression. Continued medical monitoring will be important moving forward.  [name] was accompanied by his wife and son during the current feedback session. Content of the current session focused on the results of his neuropsychological evaluation. Mr. Whetzel was given the opportunity to ask questions and his questions were answered. He was encouraged to reach out should additional questions arise. A copy of his report was provided at the conclusion of the visit.      One unit 96132 (35 minutes) was billed for Dr. Loralee time spent preparing for, conducting, and documenting the current feedback session with Mr. Loewen.

## 2024-09-02 ENCOUNTER — Other Ambulatory Visit: Payer: Self-pay | Admitting: Cardiology

## 2024-09-03 ENCOUNTER — Telehealth: Payer: Self-pay | Admitting: Cardiology

## 2024-09-03 DIAGNOSIS — D1801 Hemangioma of skin and subcutaneous tissue: Secondary | ICD-10-CM | POA: Diagnosis not present

## 2024-09-03 DIAGNOSIS — D225 Melanocytic nevi of trunk: Secondary | ICD-10-CM | POA: Diagnosis not present

## 2024-09-03 DIAGNOSIS — L570 Actinic keratosis: Secondary | ICD-10-CM | POA: Diagnosis not present

## 2024-09-03 DIAGNOSIS — L853 Xerosis cutis: Secondary | ICD-10-CM | POA: Diagnosis not present

## 2024-09-03 DIAGNOSIS — L821 Other seborrheic keratosis: Secondary | ICD-10-CM | POA: Diagnosis not present

## 2024-09-03 DIAGNOSIS — Z85828 Personal history of other malignant neoplasm of skin: Secondary | ICD-10-CM | POA: Diagnosis not present

## 2024-09-03 MED ORDER — EZETIMIBE 10 MG PO TABS
10.0000 mg | ORAL_TABLET | Freq: Every day | ORAL | 0 refills | Status: AC
Start: 2024-09-03 — End: ?

## 2024-09-03 NOTE — Telephone Encounter (Signed)
 Spoke with pt wife with update that medication has been sent to [facility].

## 2024-09-03 NOTE — Telephone Encounter (Signed)
*  STAT* If patient is at the pharmacy, call can be transferred to refill team.   1. Which medications need to be refilled? (please list name of each medication and dose if known) ezetimibe  (ZETIA ) 10 MG tablet    2. Would you like to learn more about the convenience, safety, & potential cost savings by using the [facility]?    3. Are you open to using the [facility] (Type [facility]. ).   4. Which pharmacy/location (including street and city if local pharmacy) is medication to be sent to?  [facility] - [place], [place] - [address]     5. Do they need a 30 day or 90 day supply? 90 day

## 2024-09-21 ENCOUNTER — Ambulatory Visit: Admitting: Physician Assistant

## 2024-09-30 ENCOUNTER — Ambulatory Visit: Admitting: Physician Assistant

## 2024-09-30 ENCOUNTER — Telehealth: Payer: Self-pay | Admitting: Cardiology

## 2024-09-30 ENCOUNTER — Encounter: Payer: Self-pay | Admitting: Physician Assistant

## 2024-09-30 VITALS — BP 97/62 | HR 67 | Resp 20 | Wt 179.0 lb

## 2024-09-30 DIAGNOSIS — G308 Other Alzheimer's disease: Secondary | ICD-10-CM | POA: Diagnosis not present

## 2024-09-30 DIAGNOSIS — F0280 Dementia in other diseases classified elsewhere without behavioral disturbance: Secondary | ICD-10-CM

## 2024-09-30 NOTE — Telephone Encounter (Signed)
 Pt wife states that his Neurology DR. Dina, Lauraine wants him to get him a check up asap. Please advise.

## 2024-09-30 NOTE — Progress Notes (Signed)
 Assessment & Plan  Dementia likely due to Alzheimer's disease  Progression of memory changes with difficulty remembering recent conversations and names. Neuropsychological evaluation indicates major neurocognitive disorder, dementia. Functional in daily activities but with memory impairment. No significant personality changes or depression. No hallucinations. Sleep is adequate. No significant issues with driving, though cautious at night.   - Continue memantine  10 mg twice daily. - Will consider adding donepezil pending cardiologist clearance due to potential bradycardia risk. - Continue cardiovascular risk factor management, including cholesterol, blood pressure, and blood sugar control. - Encouraged continued physical activity and cognitive stimulation. - Will discuss potential speech therapy if communication issues arise.  Essential tremor with sialorrhea Tremors are well-managed with primidone  once daily. No interference with activities of daily living. Chronic drooling noted, possibly related to tremors, but no swallowing issues reported. - Continue primidone  50 mg once daily.       Discussed the use of AI scribe software for clinical note transcription with the patient, who gave verbal consent to proceed.  History of Present Illness  Eric Simmons. is a very pleasant 83 y.o. RH male with a history of hypertension, hyperlipidemia, anxiety, depression, ET with sialorrhea and a diagnosis of dementia likely due to Alzheimer's disease seen today in follow up for memory loss. Patient is currently on memantine10 mg daily.  He is accompanied by his wife who supplements the history.  Previous records as well as any outside records available were reviewed prior to todays visit. Patient was last seen on 03/19/24 with MMSE of 29/30  His memory remains stable since the last visit, though he struggles with recalling recent conversations and names, leading to stress and reduced social  engagement, particularly at [place]. He recognizes faces and can converse despite not remembering names. He continues activities like yard work, walking, and playing memory games such as Museum/gallery Exhibitions Officer. He enjoys watching TV and sports.  He occasionally repeats himself, which his wife notes may have worsened. No issues with recognizing his home, frequent misplacement of objects, or wandering. He sometimes feels withdrawn due to memory issues but maintains social interactions. No worsening depression, hallucinations, or seizures.  He sleeps well, averaging seven to eight hours per night, without frequent nightmares or vivid dreams. No sleep apnea or need for a CPAP machine. He manages finances with his wife's oversight, who also manages his diet. No significant appetite changes and has stopped drinking beer, occasionally having a non-alcoholic beer. He has chronic drooling but no issues with swallowing or worsening drooling.    He experienced a vasovagal syncope on July 23, 2024, after a finger injury, with a brief loss of consciousness, but has not had any further episodes.  No severe headaches, vision changes, or pain requiring medication, except for a trigger finger managed with a night splint. He walks regularly, including two miles this morning, and visits the [facility] several times a week.  No urinary incontinence or bowel issues. He drives short distances and is cautious at night, with his wife driving longer distances.     Initial Visit 01/02/22 The patient is seen in neurologic consultation at the request of Randol Dawes, MD for the evaluation of memory.  The patient is accompanied by his wife who supplements the history.  The patient feels that the memory may be a little bit worse, but not that much .  His main frustration is remembering people's names, this bothers him.  He still tries to monitor taking his level of cognition by attending groups  to discuss a variety of topics, including politics,  which is very stimulating for him.  They also discussed basketball during the season.  He may have some issues with reading comprehension, and processing which may be slower than prior.  He likes to walk about 2 miles a day, 5 days a week, [place] [facility] [place] and work on the yard.  He denies repeating himself.  He denies being disoriented when walking into her room, or leaving objects in unusual places.  He denies wandering behavior.  He continues to drive, denies feeling lost.  His mood is good, denies depression or irritability.  He sleeps well, denies vivid dreams, sleepwalking, hallucinations or paranoia.  There are no hygiene concerns.  He is independent of bathing and dressing.  His medications are in a pillbox, and the finances and are on autopay.  His appetite is good, denies trouble swallowing, but he does have sialorrhea, at times bothersome, but he does not wet the pillow as of yet.  He does not cook much.  Denies leaving the stove or the faucet on.  He denies any headaches, double vision, dizziness, focal numbness or tingling, unilateral weakness or anosmia.  No history of seizures.  Denies urine incontinence, retention, constipation or diarrhea.  Denies OSA, alcohol or tobacco.  As for his essential tremor, he reports that they are about the same as prior.  He is on primidone  50 mg nightly.  He does not want to change his dosage at this time, and his wife agrees as well.   Current prescribed movement disorder medication: Primidone , 50 mg nightly   Neuropsych evaluation 08/25/24 Dr. Richie  Briefly, results suggested significant impairment surrounding complex attention, executive functioning, semantic fluency, confrontation naming, and all aspects of learning and memory. An additional weakness was exhibited across processing speed. Relative to his initial neuropsychological evaluation in November 2020, quite substantial decline is evident across complex attention, confrontation naming, and all  aspects of learning and memory. More mild decline could be argued across processing speed, basic attention, and executive functioning. Other assessed domains (including severe semantic fluency impairments) have been stable over time. Relative to his most recent October 2024 evaluation, his greatest area of decline surrounded attention/concentration and cognitive flexibility. Mild decline was also exhibited across memory. Regarding the cause for his mild dementia presentation, evidence for progressive decline over time makes the presence of a neurodegenerative illness quite likely. Concerns remain for underlying Alzheimer's pathology, including clinical presentations surrounding both Alzheimer's disease or a logopenic aphasia presentation. Retention rates were very poor across memory testing and he generally did not benefit from cueing, suggesting continued evidence for rapid forgetting and a prominent storage impairment which are the hallmark memory patterns for this type of illness. Additional prominent impairments surrounding expressive language would align with the expected disease progression. Continued medical monitoring will be important moving forward.       03/19/2024   12:00 PM 01/03/2023   12:00 PM 05/08/2018   10:18 AM  MMSE - Mini Mental State Exam  Orientation to time 5 3 5   Orientation to time comments   July 11th or 12th  Orientation to Place 5 5 5   Registration 3 3 3   Attention/ Calculation 5 5 5   Recall 2 3 2   Language- name 2 objects 2 2 2   Language- repeat 1 1 1   Language- follow 3 step command 3 3 3   Language- read & follow direction 1 1 1   Write a sentence 1 1  1  Write a sentence-comments   significant tremor  Copy design 1 1 1   Total score 29 28 29       01/02/2022   10:00 AM 06/18/2019    1:00 PM 05/20/2018    8:55 AM  Montreal Cognitive Assessment   Visuospatial/ Executive (0/5) 5 5 5   Naming (0/3) 3 3 3   Attention: Read list of digits (0/2) 2 2 2   Attention: Read list  of letters (0/1) 1 1 1   Attention: Serial 7 subtraction starting at 100 (0/3) 3 3 2   Language: Repeat phrase (0/2) 2 2 2   Language : Fluency (0/1) 1 1 1   Abstraction (0/2) 1 1 1   Delayed Recall (0/5) 0 1 0  Orientation (0/6) 5 5 6   Total 23 24 23   Adjusted Score (based on education)  24 23    Results DIAGNOSTIC Neurocognitive Testing: Major neurocognitive disorder, dementia (08/25/2024) EKG: Borderline bradycardia (06/2024)    Objective:    Neurological Exam:    VITALS:   Vitals:   09/30/24 1006  BP: 97/62  Pulse: 67  Resp: 20  SpO2: 95%  Weight: 179 lb (81.2 kg)    GEN:  The patient appears stated age and is in NAD. HEENT:  Normocephalic, atraumatic.   Neurological examination:  General: NAD, well-groomed, appears stated age. Orientation: The patient is alert. Oriented to person, place and date Cranial nerves: There is good facial symmetry.The speech is fluent and clear. No aphasia or dysarthria. Fund of knowledge is appropriate. Recent and remote memory are impaired. Attention and concentration are reduced. Able to name objects and repeat phrases.  Hearing is mildly decreased to conversational tone.  Sensation: Sensation is intact to light touch throughout Motor: Strength is at least antigravity x4. DTR's 2/4 in UE/LE     Movement examination:  Tone: There is normal tone in the UE/LE, no cogwheeling noted.  Abnormal movements:  R>L intention UE tremor.  No resting or facial tremor. No myoclonus.  No asterixis.   Coordination:  There is no decremation with RAM's. Normal finger to nose  Gait and Station: The patient has no difficulty arising out of a deep-seated chair without the use of the hands. The patient's stride length is good.  Gait is cautious and narrow.    Thank you for allowing us  the opportunity to participate in the care of this nice patient. Please do not hesitate to contact us  for any questions or concerns.   Total time spent on today's visit was  35 minutes dedicated to this patient today, preparing to see patient, examining the patient, ordering tests and/or medications and counseling the patient, documenting clinical information in the EHR or other health record, independently interpreting results and communicating results to the patient/family, discussing treatment and goals, answering patient's questions and coordinating care.  Cc:  Randol Dawes, MD  Camie Sevin 09/30/2024 2:26 PM

## 2024-09-30 NOTE — Telephone Encounter (Signed)
 Called and spoke to patient and spouse and pt had visit with Neurologist recently. Per spouse and pt the neurologist would like to start pt on medication for dementia. Spouse has requested a  visit  with pt provider. Per spouse once this visit is scheduled she can discuss the medication and side effects of the medications that may be prescribed by Neurologist. Made spouse and pt aware of available appts with App. Made pt aware that this will be forward for available appt. Understanding verbalized.

## 2024-09-30 NOTE — Patient Instructions (Signed)
 It was a pleasure to see you today at our office.   Recommendations:  Follow up June 17 at 11:30  Continue Memantine  10  twice a day .   Continue Primidone   50 mg once a day      RECOMMENDATIONS FOR ALL PATIENTS WITH MEMORY PROBLEMS: 1. Continue to exercise (Recommend 30 minutes of walking everyday, or 3 hours every week) 2. Increase social interactions - continue going to [place] and enjoy social gatherings with friends and family 3. Eat healthy, avoid fried foods and eat more fruits and vegetables 4. Maintain adequate blood pressure, blood sugar, and blood cholesterol level. Reducing the risk of stroke and cardiovascular disease also helps promoting better memory. 5. Avoid stressful situations. Live a simple life and avoid aggravations. Organize your time and prepare for the next day in anticipation. 6. Sleep well, avoid any interruptions of sleep and avoid any distractions in the bedroom that may interfere with adequate sleep quality 7. Avoid sugar, avoid sweets as there is a strong link between excessive sugar intake, diabetes, and cognitive impairment We discussed the Mediterranean diet, which has been shown to help patients reduce the risk of progressive memory disorders and reduces cardiovascular risk. This includes eating fish, eat fruits and green leafy vegetables, nuts like almonds and hazelnuts, walnuts, and also use olive oil. Avoid fast foods and fried foods as much as possible. Avoid sweets and sugar as sugar use has been linked to worsening of memory function.  There is always a concern of gradual progression of memory problems. If this is the case, then we may need to adjust level of care according to patient needs. Support, both to the patient and caregiver, should then be put into place.    FALL PRECAUTIONS: Be cautious when walking. Scan the area for obstacles that may increase the risk of trips and falls. When getting up in the mornings, sit up at the edge of the bed for a  few minutes before getting out of bed. Consider elevating the bed at the head end to avoid drop of blood pressure when getting up. Walk always in a well-lit room (use night lights in the walls). Avoid area rugs or power cords from appliances in the middle of the walkways. Use a walker or a cane if necessary and consider physical therapy for balance exercise. Get your eyesight checked regularly.  FINANCIAL OVERSIGHT: Supervision, especially oversight when making financial decisions or transactions is also recommended.  HOME SAFETY: Consider the safety of the kitchen when operating appliances like stoves, microwave oven, and blender. Consider having supervision and share cooking responsibilities until no longer able to participate in those. Accidents with firearms and other hazards in the house should be identified and addressed as well.   ABILITY TO BE LEFT ALONE: If patient is unable to contact 911 operator, consider using LifeLine, or when the need is there, arrange for someone to stay with patients. Smoking is a fire hazard, consider supervision or cessation. Risk of wandering should be assessed by caregiver and if detected at any point, supervision and safe proof recommendations should be instituted.  MEDICATION SUPERVISION: Inability to self-administer medication needs to be constantly addressed. Implement a mechanism to ensure safe administration of the medications.   DRIVING: Regarding driving, in patients with progressive memory problems, driving will be impaired. We advise to have someone else do the driving if trouble finding directions or if minor accidents are reported. Independent driving assessment is available to determine safety of driving.   If  you are interested in the driving assessment, you can contact the following:  [employer] in [place] [phone]  [facility] [phone]  [facility] [phone]  [facility]  [phone] or [phone]

## 2024-10-26 ENCOUNTER — Other Ambulatory Visit (HOSPITAL_BASED_OUTPATIENT_CLINIC_OR_DEPARTMENT_OTHER): Payer: Self-pay | Admitting: Cardiology

## 2024-11-16 NOTE — Progress Notes (Unsigned)
 " Cardiology Office Note    Date:  11/17/2024   ID:  Eric Mella., DOB 17-May-1941, MRN [id number]  PCP:  Randol Dawes, MD  Cardiologist:  Wilbert Bihari, MD   Chief Complaint  Patient presents with   Coronary Artery Disease   Hypertension   Hyperlipidemia    History of Present Illness:  Eric Ganoe. is a 84 y.o. male with a hx of HLD and ASCAD with cath 01/12/2022 showing 99% oRCA, 85% mRCA and 60% pLAD with L>R collaterals.  Medical management was recommended.    The patient is here today and is doing well.  He denies any CP or pressure, SOB, DOE, PND, orthopnea, LE edema, dizziness, palpitations or syncope.  Past Medical History:  Diagnosis Date   Adenomatous colon polyp 03/2005   tubular adenoma   Aortic atherosclerosis    BCC (basal cell carcinoma), face    left face, R ear (04/2014), nose 05/2016   Benign familial tremor 08/29/2012   Coronary artery disease involving native coronary artery of native heart with angina pectoris    Lipid rich plaque; cardiac cath 01/12/2022 which showed 99% oRCA, 85% mRCA and 60% pLAD with L>R collaterals. Medical management was recommended.   Dysphagia 12/28/2015   Erectile dysfunction    Essential hypertension    Hemorrhoids    internal and external   IFG (impaired fasting glucose) 12/10/2012   Glucose 111 11/2012    Insomnia    Mild dementia, concerns for Alzheimer's disease 08/25/2024   Mild mitral regurgitation    Primary osteoarthritis of left hip 04/18/2021   Pseudogout of knee 07/2003   left   Pulmonary nodule    Pure hypercholesterolemia    Syncope and collapse     Past Surgical History:  Procedure Laterality Date   COLONOSCOPY  6/06, 05/2010   Dr. Avram   CORONARY PRESSURE/FFR STUDY N/A 01/12/2022   Procedure: INTRAVASCULAR PRESSURE WIRE/FFR STUDY;  Surgeon: Wendel Lurena POUR, MD;  Location: [facility];  Service: Cardiovascular;  Laterality: N/A;   LEFT HEART CATH AND CORONARY ANGIOGRAPHY N/A 01/12/2022    Procedure: LEFT HEART CATH AND CORONARY ANGIOGRAPHY;  Surgeon: Wendel Lurena POUR, MD;  Location: [facility];  Service: Cardiovascular;  Laterality: N/A;   MOHS SURGERY  6/07   L face, Dr. Anniece   POLYPECTOMY     TONSILLECTOMY     TOTAL HIP ARTHROPLASTY Left 04/18/2021   Procedure: LEFT TOTAL HIP ARTHROPLASTY ANTERIOR APPROACH;  Surgeon: Sheril Coy, MD;  Location: [facility];  Service: Orthopedics;  Laterality: Left;   VASECTOMY      Current Medications: Current Meds  Medication Sig   acetaminophen  (TYLENOL ) 650 MG CR tablet Take 650 mg by mouth every 8 (eight) hours as needed for pain.   amLODipine  (NORVASC ) 5 MG tablet TAKE ONE TABLET BY MOUTH DAILY AT NOON   aspirin  EC 81 MG tablet Take 1 tablet (81 mg total) by mouth in the morning. Swallow whole.   Cholecalciferol (VITAMIN D3) 50 MCG (2000 UT) TABS Take 1 tablet by mouth daily.   ezetimibe  (ZETIA ) 10 MG tablet Take 1 tablet (10 mg total) by mouth daily.   isosorbide  mononitrate (IMDUR ) 30 MG 24 hr tablet TAKE ONE TABLET EVERY MORNING AND ONE-HALF TABLET AT NIGHT   Lidocaine -Glycerin (PREPARATION H EX) Place 1 application  rectally daily as needed (irritation).   memantine  (NAMENDA ) 10 MG tablet Take 1 tablet (10 mg total) by mouth 2 (two) times daily.  metoprolol  succinate (TOPROL -XL) 25 MG 24 hr tablet take ONE-HALF TABLET BY MOUTH DAILY   nitroGLYCERIN  (NITROSTAT ) 0.4 MG SL tablet PLACE ONE TABLET UNDER THE TONGUE EVERY FIVE MINUTES AS NEEDED FOR CHEST PAIN   primidone  (MYSOLINE ) 50 MG tablet Take 1tab twice a day (Patient taking differently: Take 1tab once a day)   Propylene Glycol (SYSTANE COMPLETE) 0.6 % SOLN Place 1 drop into both eyes daily as needed (dry eyes).   rosuvastatin  (CRESTOR ) 20 MG tablet Take 1 tablet (20 mg total) by mouth daily.    Allergies:   Codeine   Social History   Socioeconomic History   Marital status: Married    Spouse name: Not on file   Number of children: 2   Years of education: 16    Highest education level: Bachelor's degree (e.g., BA, AB, BS)  Occupational History   Occupation: Retired  Tobacco Use   Smoking status: Former    Types: Cigars   Smokeless tobacco: Never   Tobacco comments:    as a teenager  Vaping Use   Vaping status: Never Used  Substance and Sexual Activity   Alcohol use: Not Currently    Comment: rare non-alcoholic beer   Drug use: No   Sexual activity: Not Currently  Other Topics Concern   Not on file  Social History Narrative   Widowed 2013 (after wife's long battle with MS).  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   Children live in [place], [place] and [place] . 4 grandchildren. Sudie has 9      Married March 2016 (Sudie)   Right handed   Drinks caffeine--now mostly decaffeinated   Two story home      Updated 06/2024   Social Drivers of Health   Tobacco Use: Medium Risk (11/17/2024)   Patient History    Smoking Tobacco Use: Former    Smokeless Tobacco Use: Never    Passive Exposure: Not on file  Financial Resource Strain: Low Risk (07/06/2024)   Overall Financial Resource Strain (CARDIA)    Difficulty of Paying Living Expenses: Not hard at all  Food Insecurity: No Food Insecurity (07/06/2024)   Epic    Worried About Programme Researcher, Broadcasting/film/video in the Last Year: Never true    Ran Out of Food in the Last Year: Never true  Transportation Needs: No Transportation Needs (07/06/2024)   Epic    Lack of Transportation (Medical): No    Lack of Transportation (Non-Medical): No  Physical Activity: Sufficiently Active (07/06/2024)   Exercise Vital Sign    Days of Exercise per Week: 6 days    Minutes of Exercise per Session: 50 min  Stress: No Stress Concern Present (07/06/2024)   [employer] - Occupational Stress Questionnaire    Feeling of Stress: Only a little  Social Connections: Socially Integrated (07/06/2024)   Social Connection and Isolation Panel    Frequency of Communication with Friends and Family: More than three times a week    Frequency of Social Gatherings with Friends and Family: More than three times a week    Attends Religious Services: More than 4 times per year    Active Member of [employer] or Organizations: Yes  Attends Banker Meetings: More than 4 times per year    Marital Status: Married  Depression (PHQ2-9): Low Risk (07/06/2024)   Depression (PHQ2-9)    PHQ-2 Score: 0  Alcohol Screen: Not on file  Housing: Low Risk (07/06/2024)   Epic    Unable to Pay for Housing in the Last Year: No    Number of Times Moved in the Last Year: 0    Homeless in the Last Year: No  Utilities: Not At Risk (07/06/2024)   Epic    Threatened with loss of utilities: No  Health Literacy: Not on file     Family History:  The patient's family history includes Cancer (age of onset: 81) in his mother; Colon cancer in his mother; Colon polyps in his mother; Diabetes in his mother; Heart disease in his brother, father, and son; Hyperlipidemia in his brother, brother, brother, son, and son; Hypertension in his father; Sleep apnea in his son; Syncope episode in his son; Tremor in his brother and brother.   ROS:   Please see the history of present illness.    ROS All other systems reviewed and are negative.      No data to display             PHYSICAL EXAM:   VS:  BP (!) 82/50   Pulse 72   Ht 6' (1.829 m)   Wt 180 lb (81.6 kg)   SpO2 94%   BMI 24.41 kg/m    GEN: Well nourished, well developed in no acute distress HEENT: Normal NECK: No JVD; No carotid bruits LYMPHATICS: No lymphadenopathy CARDIAC:RRR, no murmurs, rubs, gallops RESPIRATORY:  Clear to auscultation without rales, wheezing or rhonchi  ABDOMEN: Soft, non-tender,  non-distended MUSCULOSKELETAL:  No edema; No deformity  SKIN: Warm and dry NEUROLOGIC:  Alert and oriented x 3 PSYCHIATRIC:  Normal affect   Wt Readings from Last 3 Encounters:  11/17/24 180 lb (81.6 kg)  09/30/24 179 lb (81.2 kg)  07/23/24 177 lb 11.1 oz (80.6 kg)      Studies/Labs Reviewed:        Recent Labs: 07/23/2024: ALT 28; BUN 23; Creatinine, Ser 1.30; Hemoglobin 14.6; Platelets 206; Potassium 4.0; Sodium 140   Lipid Panel    Component Value Date/Time   CHOL 107 03/09/2024 0808   TRIG 67 03/09/2024 0808   HDL 44 03/09/2024 0808   CHOLHDL 2.4 03/09/2024 0808   CHOLHDL 2.9 10/30/2017 0944   VLDL 17 08/20/2016 1051   LDLCALC 49 03/09/2024 0808   LDLCALC 80 10/30/2017 0944    Additional studies/ records that were reviewed today include:  2D echo 03/2020 IMPRESSIONS     1. Left ventricular ejection fraction, by estimation, is 60 to 65%. The  left ventricle has normal function. The left ventricle has no regional  wall motion abnormalities. Left ventricular diastolic parameters are  indeterminate.   2. Right ventricular systolic function is normal. The right ventricular  size is normal. There is normal pulmonary artery systolic pressure. The  estimated right ventricular systolic pressure is 16.5 mmHg.   3. The mitral valve is grossly normal. Mild mitral valve regurgitation.  No evidence of mitral stenosis.   4. The aortic valve is tricuspid. Aortic valve regurgitation is not  visualized. Mild aortic valve sclerosis is present, with no evidence of  aortic valve stenosis.   5. The inferior vena cava is normal in size with greater than 50%  respiratory variability, suggesting right atrial pressure of 3 mmHg.  Cardiac Cath 03/2020 Study Highlights    Nuclear stress EF: 56%. There was no ST segment deviation noted during stress. No T wave inversion was noted during stress. The study is normal. This is a low risk study. The left ventricular ejection fraction is  normal (55-65%).   1. There are reduced counts in the mid inferior segment on rest and stress imaging consistent with diaphragm attenuation.  2. This is a normal study without evidence of ischemia or infarction.  3. Normal LVEF, 56%.  4. This is a low-risk study.     ASSESSMENT:    1. Coronary artery disease due to lipid rich plaque   2. Chronic stable angina   3. Primary hypertension   4. Pure hypercholesterolemia         PLAN:  In order of problems listed above:  ASCAD with chronic stable angina -nuclear stress test showed no ischemia in 03/2020 -2D echo showed normal LVF in June 2021 -coronary CTA showed 70-99% oRCA, 50-69% RCA crux, 50%dLM, 70-99% oLAD, 50-69% OM1 -cardiac cath 01/12/2022 which showed 99% oRCA, 85% mRCA and 60% pLAD with L>R collaterals.  Medical management was recommended.  - he has not had any angina since I saw him last - continue ASA 81mg  daily, Imdur  30mg  qam and 15mg  qpm,  Toprol  XL 12.5mg  daily and Crestor  20mg  daily with PRN refills - stopping Amlodipine  due to hypotension  HTN -Hypotensive today at systolic -stop Amlodipine  -continue Imdur  30mg  qam and 15mg  qpm, and Toprol  XL 12.5mg  daily with PRN refills -I have personally reviewed and interpreted outside labs performed by patient's PCP which showed serum creatinine 1.3 and potassium 4 on 07/23/2024  HLD -LDL goal < 50 -I have personally reviewed and interpreted outside labs performed by patient's PCP which showed LDL 49 and HDL 44 on 03/09/2024 -continue Zetia  10mg  daily and Crestor  20mg  daily with PRN refills   Followup  in 2 weeks with PA and me in 6 months   Medication Adjustments/Labs and Tests Ordered: Current medicines are reviewed at length with the patient today.  Concerns regarding medicines are outlined above.  Medication changes, Labs and Tests ordered today are listed in the Patient Instructions below.  There are no Patient Instructions on file for this  visit.   Signed, Wilbert Bihari, MD  11/17/2024 9:15 AM    [facility] [address] [place], [place], [place]  [address] Phone: [phone]; Fax: [phone]   "

## 2024-11-17 ENCOUNTER — Ambulatory Visit: Admitting: Cardiology

## 2024-11-17 ENCOUNTER — Encounter: Payer: Self-pay | Admitting: Cardiology

## 2024-11-17 ENCOUNTER — Telehealth: Payer: Self-pay | Admitting: Cardiology

## 2024-11-17 VITALS — BP 82/50 | HR 72 | Ht 72.0 in | Wt 180.0 lb

## 2024-11-17 DIAGNOSIS — I2089 Other forms of angina pectoris: Secondary | ICD-10-CM | POA: Diagnosis not present

## 2024-11-17 DIAGNOSIS — I2583 Coronary atherosclerosis due to lipid rich plaque: Secondary | ICD-10-CM

## 2024-11-17 DIAGNOSIS — I10 Essential (primary) hypertension: Secondary | ICD-10-CM

## 2024-11-17 DIAGNOSIS — E7800 Pure hypercholesterolemia, unspecified: Secondary | ICD-10-CM

## 2024-11-17 DIAGNOSIS — I2510 Atherosclerotic heart disease of native coronary artery without angina pectoris: Secondary | ICD-10-CM | POA: Diagnosis not present

## 2024-11-17 NOTE — Telephone Encounter (Signed)
 Hi. Pt needs a 2wk fu with APP. Pt preference: morning. Thank you.

## 2024-11-17 NOTE — Addendum Note (Signed)
 Addended by: JANIT GENI CROME on: 11/17/2024 09:31 AM   Modules accepted: Orders

## 2024-11-17 NOTE — Patient Instructions (Signed)
 Medication Instructions:  Please STOP amlodipine .   *If you need a refill on your cardiac medications before your next appointment, please call your pharmacy*  Lab Work: None.  If you have labs (blood work) drawn today and your tests are completely normal, you will receive your results only by: MyChart Message (if you have MyChart) OR A paper copy in the mail If you have any lab test that is abnormal or we need to change your treatment, we will call you to review the results.  Testing/Procedures: None.  Follow-Up: At [facility], you and your health needs are our priority.  As part of our continuing mission to provide you with exceptional heart care, our providers are all part of one team.  This team includes your primary Cardiologist (physician) and Advanced Practice Providers or APPs (Physician Assistants and Nurse Practitioners) who all work together to provide you with the care you need, when you need it.  Your next appointment:   2 week(s) (for blood pressure check with APP)  Provider:   Josefa Beauvais, NP, Orren Fabry, PA-C, Jon Hails, PA-C, Dayna Dunn, PA-C, Madison Fountain, NP, Callie Goodrich, PA-C, Olivia Pavy, PA-C, Hao Meng, PA-C, Damien Braver, NP, or Glendia Ferrier, PA-C        Then, Wilbert Bihari, MD will plan to see you again in 6 month(s).

## 2024-12-03 ENCOUNTER — Other Ambulatory Visit: Payer: Self-pay | Admitting: Cardiology

## 2024-12-04 ENCOUNTER — Ambulatory Visit: Admitting: Physician Assistant

## 2025-01-01 ENCOUNTER — Ambulatory Visit: Admitting: Cardiology

## 2025-04-14 ENCOUNTER — Ambulatory Visit: Admitting: Physician Assistant

## 2025-07-21 ENCOUNTER — Ambulatory Visit: Payer: Self-pay | Admitting: Family Medicine
